# Patient Record
Sex: Female | Born: 1960 | Race: Black or African American | Hispanic: No | Smoking: Never smoker
Health system: Southern US, Community
[De-identification: ages and names within clinical notes are randomized; demographics above are authoritative.]

## PROBLEM LIST (undated history)

## (undated) DIAGNOSIS — M1990 Unspecified osteoarthritis, unspecified site: Secondary | ICD-10-CM

## (undated) DIAGNOSIS — I10 Essential (primary) hypertension: Secondary | ICD-10-CM

## (undated) DIAGNOSIS — E785 Hyperlipidemia, unspecified: Secondary | ICD-10-CM

## (undated) HISTORY — DX: Hyperlipidemia, unspecified: E78.5

## (undated) HISTORY — PX: NO PAST SURGERIES: SHX2092

## (undated) HISTORY — DX: Essential (primary) hypertension: I10

---

## 2004-05-05 ENCOUNTER — Emergency Department: Payer: Self-pay | Admitting: Emergency Medicine

## 2005-04-21 ENCOUNTER — Emergency Department: Payer: Self-pay | Admitting: Emergency Medicine

## 2006-01-22 ENCOUNTER — Ambulatory Visit: Payer: Self-pay

## 2007-05-05 ENCOUNTER — Ambulatory Visit: Payer: Self-pay | Admitting: General Practice

## 2010-08-12 ENCOUNTER — Ambulatory Visit: Payer: Self-pay

## 2011-03-12 ENCOUNTER — Other Ambulatory Visit: Payer: Self-pay | Admitting: Physician Assistant

## 2011-05-09 ENCOUNTER — Other Ambulatory Visit: Payer: Self-pay

## 2011-05-09 LAB — BASIC METABOLIC PANEL
BUN: 12 mg/dL (ref 7–18)
Chloride: 102 mmol/L (ref 98–107)
EGFR (African American): >60
EGFR (Non-African Amer.): >60
Glucose: 109 mg/dL — ABNORMAL HIGH (ref 65–99)
Potassium: 2.9 mmol/L — ABNORMAL LOW (ref 3.5–5.1)
Sodium: 143 mmol/L (ref 136–145)

## 2011-05-09 LAB — CBC WITH DIFFERENTIAL/PLATELET
Basophil %: 0.4 %
Eosinophil #: 0.2 10*3/uL (ref 0.0–0.7)
HCT: 40.2 % (ref 35.0–47.0)
HGB: 12.8 g/dL (ref 12.0–16.0)
Lymphocyte #: 2.3 10*3/uL (ref 1.0–3.6)
Lymphocyte %: 29.9 %
MCHC: 31.9 g/dL — ABNORMAL LOW (ref 32.0–36.0)
MCV: 80 fL (ref 80–100)
Neutrophil #: 4.5 10*3/uL (ref 1.4–6.5)
RBC: 5.04 10*6/uL (ref 3.80–5.20)

## 2011-07-23 ENCOUNTER — Ambulatory Visit: Payer: Self-pay | Admitting: Family Medicine

## 2011-07-23 ENCOUNTER — Other Ambulatory Visit: Payer: Self-pay

## 2011-07-23 LAB — BASIC METABOLIC PANEL
Anion Gap: 8 (ref 7–16)
BUN: 12 mg/dL (ref 7–18)
Chloride: 103 mmol/L (ref 98–107)
Co2: 30 mmol/L (ref 21–32)
EGFR (African American): >60
EGFR (Non-African Amer.): >60
Glucose: 93 mg/dL (ref 65–99)
Osmolality: 281 (ref 275–301)
Sodium: 141 mmol/L (ref 136–145)

## 2011-07-23 LAB — HEMOGLOBIN A1C: Hemoglobin A1C: 5.9 % (ref 4.2–6.3)

## 2011-10-22 ENCOUNTER — Other Ambulatory Visit: Payer: Self-pay

## 2011-10-22 LAB — BASIC METABOLIC PANEL
Anion Gap: 7 (ref 7–16)
BUN: 13 mg/dL (ref 7–18)
Calcium, Total: 9.1 mg/dL (ref 8.5–10.1)
Chloride: 101 mmol/L (ref 98–107)
Creatinine: 0.89 mg/dL (ref 0.60–1.30)
EGFR (Non-African Amer.): >60
Glucose: 86 mg/dL (ref 65–99)
Osmolality: 279 (ref 275–301)
Potassium: 3.2 mmol/L — ABNORMAL LOW (ref 3.5–5.1)
Sodium: 140 mmol/L (ref 136–145)

## 2012-08-05 ENCOUNTER — Ambulatory Visit: Payer: Self-pay | Admitting: Family Medicine

## 2012-09-16 ENCOUNTER — Other Ambulatory Visit: Payer: Self-pay

## 2012-09-16 LAB — LIPID PANEL: Ldl Cholesterol, Calc: 121 mg/dL — ABNORMAL HIGH (ref 0–100)

## 2012-09-16 LAB — COMPREHENSIVE METABOLIC PANEL
Alkaline Phosphatase: 98 U/L (ref 50–136)
Anion Gap: 4 — ABNORMAL LOW (ref 7–16)
Calcium, Total: 9 mg/dL (ref 8.5–10.1)
Chloride: 105 mmol/L (ref 98–107)
EGFR (African American): >60
EGFR (Non-African Amer.): >60
Glucose: 97 mg/dL (ref 65–99)
SGOT(AST): 14 U/L — ABNORMAL LOW (ref 15–37)
Total Protein: 7.5 g/dL (ref 6.4–8.2)

## 2013-09-04 ENCOUNTER — Emergency Department: Payer: Self-pay | Admitting: Emergency Medicine

## 2014-04-13 ENCOUNTER — Ambulatory Visit: Payer: Self-pay

## 2014-04-13 LAB — BASIC METABOLIC PANEL
Anion Gap: 6 — ABNORMAL LOW (ref 7–16)
BUN: 12 mg/dL (ref 7–18)
Calcium, Total: 8.7 mg/dL (ref 8.5–10.1)
Chloride: 106 mmol/L (ref 98–107)
Co2: 29 mmol/L (ref 21–32)
Creatinine: 0.84 mg/dL (ref 0.60–1.30)
EGFR (African American): >60
EGFR (Non-African Amer.): >60
Glucose: 91 mg/dL (ref 65–99)
Osmolality: 281 (ref 275–301)
Potassium: 3.5 mmol/L (ref 3.5–5.1)
Sodium: 141 mmol/L (ref 136–145)

## 2014-12-12 ENCOUNTER — Other Ambulatory Visit: Payer: Self-pay | Admitting: Family Medicine

## 2015-01-11 ENCOUNTER — Other Ambulatory Visit: Payer: Self-pay | Admitting: Family Medicine

## 2015-01-17 ENCOUNTER — Ambulatory Visit (INDEPENDENT_AMBULATORY_CARE_PROVIDER_SITE_OTHER): Payer: 59 | Admitting: Family Medicine

## 2015-01-17 ENCOUNTER — Other Ambulatory Visit: Payer: Self-pay | Admitting: Family Medicine

## 2015-01-17 ENCOUNTER — Encounter: Payer: Self-pay | Admitting: Family Medicine

## 2015-01-17 VITALS — BP 140/80 | HR 76 | Resp 16 | Ht 64.0 in | Wt 234.4 lb

## 2015-01-17 DIAGNOSIS — I10 Essential (primary) hypertension: Secondary | ICD-10-CM | POA: Insufficient documentation

## 2015-01-17 DIAGNOSIS — Z23 Encounter for immunization: Secondary | ICD-10-CM | POA: Diagnosis not present

## 2015-01-17 DIAGNOSIS — Z6841 Body Mass Index (BMI) 40.0 and over, adult: Secondary | ICD-10-CM

## 2015-01-17 MED ORDER — LISINOPRIL 40 MG PO TABS: 40.0000 mg | ORAL_TABLET | Freq: Every day | ORAL | Status: DC

## 2015-01-17 NOTE — Progress Notes (Signed)
Name: Kelli Chapman   MRN: [id number]    DOB: 11-May-1960   Date:01/17/2015       Progress Note  Subjective  Chief Complaint  Chief Complaint  Patient presents with  . Hypertension    HPI For f/u of HBP.  Taking Amlodipine and HCTZ (?dose of HCTZ).   No problems reported.   No problem-specific assessment & plan notes found for this encounter.   Past Medical History  Diagnosis Date  . Hypertension     Social History  Substance Use Topics  . Smoking status: Never Smoker   . Smokeless tobacco: Never Used  . Alcohol Use: No     Current outpatient prescriptions:  .  amLODipine (NORVASC) 5 MG tablet, TAKE 1 TABLET BY MOUTH ONCE DAILY, Disp: 7 tablet, Rfl: 0 .  hydrochlorothiazide (HYDRODIURIL) 25 MG tablet, Take 25 mg by mouth daily., Disp: , Rfl:   Not on File  Review of Systems  Constitutional: Negative for fever, chills, weight loss and malaise/fatigue.  HENT: Negative for hearing loss.   Eyes: Negative for blurred vision and double vision.  Respiratory: Negative for cough, sputum production, shortness of breath and wheezing.   Cardiovascular: Negative for chest pain, palpitations, orthopnea and leg swelling.  Gastrointestinal: Negative for heartburn, abdominal pain and blood in stool.  Genitourinary: Negative for dysuria, urgency and frequency.  Musculoskeletal: Negative for myalgias and joint pain.  Neurological: Negative for dizziness, tremors, weakness and headaches.      Objective  Filed Vitals:   01/17/15 1038  BP: 143/84  Pulse: 76  Resp: 16  Height: 5\' 4"  (1.626 m)  Weight: 234 lb 6.4 oz (106.323 kg)     Physical Exam  Constitutional: She is well-developed, well-nourished, and in no distress. No distress.  HENT:  Head: Normocephalic and atraumatic.  Eyes: Conjunctivae and EOM are normal. Pupils are equal, round, and reactive to light. No scleral icterus.  Neck: Normal range of motion. Neck supple. Carotid bruit is not present. No thyromegaly  present.  Cardiovascular: Normal rate, regular rhythm, normal heart sounds and intact distal pulses.  Exam reveals no gallop and no friction rub.   No murmur heard. Pulmonary/Chest: Effort normal and breath sounds normal. No respiratory distress. She has no wheezes. She has no rales.  Abdominal: Soft. Bowel sounds are normal. She exhibits no distension, no abdominal bruit and no mass. There is no tenderness.  Musculoskeletal: She exhibits no edema.  Lymphadenopathy:    She has no cervical adenopathy.  Vitals reviewed.     No results found for this or any previous visit (from the past 2160 hour(s)).   Assessment & Plan  1. Essential hypertension -cont. Amlodipine 5 mg/d. - lisinopril (PRINIVIL,ZESTRIL) 40 MG tablet; Take 1 tablet (40 mg total) by mouth daily.  Dispense: 90 tablet; Refill: 3  2. Need for influenza vaccination  - Flu Vaccine QUAD 36+ mos PF IM (Fluarix & Fluzone Quad PF)

## 2015-01-17 NOTE — Telephone Encounter (Signed)
Pt. Called need a refill on  Lisinopril  [facility]

## 2015-01-17 NOTE — Patient Instructions (Signed)
Plan CMP, CBC, and Lipid panel on return.

## 2015-01-17 NOTE — Telephone Encounter (Signed)
New rx was sent to pharmacy

## 2015-09-30 ENCOUNTER — Emergency Department
Admission: EM | Admit: 2015-09-30 | Discharge: 2015-09-30 | Disposition: A | Discharge status: Home / Self Care | Payer: 59 | Attending: Emergency Medicine | Admitting: Emergency Medicine

## 2015-09-30 ENCOUNTER — Encounter: Payer: Self-pay | Admitting: *Deleted

## 2015-09-30 DIAGNOSIS — M7551 Bursitis of right shoulder: Secondary | ICD-10-CM | POA: Insufficient documentation

## 2015-09-30 DIAGNOSIS — I10 Essential (primary) hypertension: Secondary | ICD-10-CM | POA: Insufficient documentation

## 2015-09-30 DIAGNOSIS — M719 Bursopathy, unspecified: Secondary | ICD-10-CM

## 2015-09-30 DIAGNOSIS — M25511 Pain in right shoulder: Secondary | ICD-10-CM | POA: Diagnosis present

## 2015-09-30 MED ORDER — IBUPROFEN 800 MG PO TABS: 800.0000 mg | ORAL_TABLET | Freq: Three times a day (TID) | ORAL | Status: DC | PRN

## 2015-09-30 MED ORDER — HYDROCODONE-ACETAMINOPHEN 5-325 MG PO TABS
1.0000 | ORAL_TABLET | Freq: Once | ORAL | Status: AC
  Administered 2015-09-30: 1 via ORAL
  Filled 2015-09-30: qty 1

## 2015-09-30 MED ORDER — HYDROCODONE-ACETAMINOPHEN 5-325 MG PO TABS: 1.0000 | ORAL_TABLET | Freq: Four times a day (QID) | ORAL | Status: DC | PRN

## 2015-09-30 MED ORDER — IBUPROFEN 800 MG PO TABS
800.0000 mg | ORAL_TABLET | Freq: Once | ORAL | Status: AC
  Administered 2015-09-30: 800 mg via ORAL
  Filled 2015-09-30: qty 1

## 2015-09-30 NOTE — ED Provider Notes (Signed)
[facility] Emergency Department Provider Note   ____________________________________________  Time seen: Approximately 4:01 AM  I have reviewed the triage vital signs and the nursing notes.   HISTORY  Chief Complaint Shoulder Pain    HPI Kelli Chapman is a 55 y.o. female who presents to the ED from home with a chief complaint of right shoulder pain. 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 She is right-hand dominant and has been noticing nagging right shoulder pain for the past 3 weeks. Pain worsening 1-1/2 days ago while at work. Denies associated fever, chills, chest pain, shortness of breath, abdominal pain, nausea, vomiting, diarrhea. Denies recent travel or trauma/injury. Nothing makes her pain better (she has not tried anything); movement makes her pain worse.   Past Medical History  Diagnosis Date  . Hypertension     Patient Active Problem List   Diagnosis Date Noted  . HBP (high blood pressure) 01/17/2015  . Obesity 01/17/2015    History reviewed. No pertinent past surgical history.  Current Outpatient Rx  Name  Route  Sig  Dispense  Refill  . amLODipine (NORVASC) 5 MG tablet      TAKE 1 TABLET BY MOUTH ONCE DAILY NEEDS OFFICE VISIT   90 tablet   3     PATIENT IS REQUESTING A 90 DAY SUPPLY. PATIENT PIC ...   . HYDROcodone-acetaminophen (NORCO) 5-325 MG tablet   Oral   Take 1 tablet by mouth every 6 (six) hours as needed for moderate pain.   15 tablet   0   . ibuprofen (ADVIL,MOTRIN) 800 MG tablet   Oral   Take 1 tablet (800 mg total) by mouth every 8 (eight) hours as needed for moderate pain.   15 tablet   0   . lisinopril (PRINIVIL,ZESTRIL) 40 MG tablet   Oral   Take 1 tablet (40 mg total) by mouth daily.   90 tablet   3     Allergies Review of patient's allergies indicates no known allergies.  Family History  Problem Relation Age of Onset  . Hypertension Mother   . Heart disease  Mother   . Kidney disease Paternal Grandfather     Social History Social History  Substance Use Topics  . Smoking status: Never Smoker   . Smokeless tobacco: Never Used  . Alcohol Use: No    Review of Systems  Constitutional: No fever/chills. Eyes: No visual changes. ENT: No sore throat. Cardiovascular: Denies chest pain. Respiratory: Denies shortness of breath. Gastrointestinal: No abdominal pain.  No nausea, no vomiting.  No diarrhea.  No constipation. Genitourinary: Negative for dysuria. Musculoskeletal: Positive for right shoulder pain. Negative for back pain. Skin: Negative for rash. Neurological: Negative for headaches, focal weakness or numbness.  10-point ROS otherwise negative.  ____________________________________________   PHYSICAL EXAM:  VITAL SIGNS: ED Triage Vitals  Enc Vitals Group     BP 09/30/15 0321 159/99 mmHg     Pulse Rate 09/30/15 0321 81     Resp 09/30/15 0321 20     Temp 09/30/15 0321 98.2 F (36.8 C)     Temp Source 09/30/15 0321 Oral     SpO2 09/30/15 0321 99 %     Weight 09/30/15 0321 230 lb 6.4 oz (104.509 kg)     Height 09/30/15 0321 5\' 4"  (1.626 m)     Head Cir --      Peak Flow --      Pain  Score 09/30/15 0322 5     Pain Loc --      Pain Edu? --      Excl. in GC? --     Constitutional: Alert and oriented. Well appearing and in no acute distress. Eyes: Conjunctivae are normal. PERRL. EOMI. Head: Atraumatic. Nose: No congestion/rhinnorhea. Mouth/Throat: Mucous membranes are moist.  Oropharynx non-erythematous. Neck: No stridor.  No cervical spine tenderness to palpation. Cardiovascular: Normal rate, regular rhythm. Grossly normal heart sounds.  Good peripheral circulation. Respiratory: Normal respiratory effort.  No retractions. Lungs CTAB. Gastrointestinal: Soft and nontender. No distention. No abdominal bruits. No CVA tenderness. Musculoskeletal: Right shoulder slightly swollen. Limited range of motion abduction secondary to  pain. 2+ radial pulses. Brisk, less than 5 second capillary refill. Neurologic:  Normal speech and language. No gross focal neurologic deficits are appreciated. No gait instability. Skin:  Skin is warm, dry and intact. No rash noted. Psychiatric: Mood and affect are normal. Speech and behavior are normal.  ____________________________________________   LABS (all labs ordered are listed, but only abnormal results are displayed)  Labs Reviewed - No data to display ____________________________________________  EKG  None ____________________________________________  RADIOLOGY  None ____________________________________________   PROCEDURES  Procedure(s) performed: None  Critical Care performed: No  ____________________________________________   INITIAL IMPRESSION / ASSESSMENT AND PLAN / ED COURSE  Pertinent labs & imaging results that were available during my care of the patient were reviewed by me and considered in my medical decision making (see chart for details).  55 year old female who presents with a three-week history of right shoulder pain. History and clinical exam consistent with bursitis. Will place on a regimen of NSAIDs, analgesia and close follow-up with orthopedics. Strict return precautions given. Patient verbalizes understanding and agrees with plan of care. ____________________________________________   FINAL CLINICAL IMPRESSION(S) / ED DIAGNOSES  Final diagnoses:  Bursitis      NEW MEDICATIONS STARTED DURING THIS VISIT:  New Prescriptions   HYDROCODONE-ACETAMINOPHEN (NORCO) 5-325 MG TABLET    Take 1 tablet by mouth every 6 (six) hours as needed for moderate pain.   IBUPROFEN (ADVIL,MOTRIN) 800 MG TABLET    Take 1 tablet (800 mg total) by mouth every 8 (eight) hours as needed for moderate pain.     Note:  This document was prepared using Dragon voice recognition software and may include unintentional dictation errors.    Irean HongJade J Sung,  MD 09/30/15 [id number]

## 2015-09-30 NOTE — Discharge Instructions (Signed)
1. Take pain medicines as needed (Motrin/Norco #15). 2. Apply ice to affected area several times daily. 3. Return to the ER for worsening symptoms, persistent vomiting, difficulty breathing or other concerns.

## 2015-09-30 NOTE — ED Notes (Addendum)
Pt c/o nagging R shoulder pain x 3 weeks. Pt states worsening pain starting on Friday. Pt states pain exacerbated by upward and outward movement and when lying flat. Pt states she hears and feels her R shoulder "crackling". R arm and hand warm w/ palpable pulses. 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 Pt has taken no meds for pain prior to arrival.

## 2015-10-24 ENCOUNTER — Encounter: Payer: Self-pay | Admitting: Physician Assistant

## 2015-10-24 ENCOUNTER — Ambulatory Visit: Payer: Self-pay | Admitting: Physician Assistant

## 2015-10-24 VITALS — BP 160/100 | HR 76 | Temp 98.8°F

## 2015-10-24 DIAGNOSIS — M7521 Bicipital tendinitis, right shoulder: Secondary | ICD-10-CM

## 2015-10-24 MED ORDER — METHYLPREDNISOLONE 4 MG PO TBPK: ORAL_TABLET | ORAL | 0 refills | Status: DC

## 2015-10-24 NOTE — Progress Notes (Signed)
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

## 2015-10-26 DIAGNOSIS — M7541 Impingement syndrome of right shoulder: Secondary | ICD-10-CM | POA: Diagnosis not present

## 2015-11-02 ENCOUNTER — Encounter: Payer: Self-pay | Admitting: Physical Therapy

## 2015-11-07 ENCOUNTER — Ambulatory Visit: Payer: 59 | Attending: Orthopedic Surgery

## 2015-11-07 DIAGNOSIS — R293 Abnormal posture: Secondary | ICD-10-CM | POA: Insufficient documentation

## 2015-11-07 DIAGNOSIS — M25511 Pain in right shoulder: Secondary | ICD-10-CM | POA: Insufficient documentation

## 2015-11-07 NOTE — Therapy (Signed)
[facility] MAIN [facility] SERVICES [address] [place], [place], [address] Phone: [phone]   Fax:  [phone]  Physical Therapy Evaluation  Patient Details  Name: Kelli Chapman MRN: [id number] Date of Birth: 1961/03/02 No Data Recorded  Encounter Date: 11/07/2015      PT End of Session - 11/07/15 0904    Visit Number 1   Number of Visits 13   Date for PT Re-Evaluation 12/05/15   PT Start Time 0805   PT Stop Time 0858   PT Time Calculation (min) 53 min   Activity Tolerance Patient tolerated treatment well   Behavior During Therapy [facility] for tasks assessed/performed      Past Medical History:  Diagnosis Date  . Hypertension     History reviewed. No pertinent surgical history.  There were no vitals filed for this visit.       Subjective Assessment - 11/07/15 0938    Subjective Pt reports having R shoulder pain over the last month and  with no known onset. 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 She reports hearing and feeling clicking in her shoulder about 1 month ago. She states that it will sometimes radiate up towards her neck or down her arm. She denies any neck pain otherwise, denies n/t. She states that the R arm has began to feel weaker when lifting. She reports her pain being on the lateral aspect of her R shoulder. Lifting her arm and her work aggravates it. She states the ibuprofen helps it when she takes it. She has tried ice pack but it makes it throb/hurt worse. She reports it is difficult to get comfortable but she falls asleep easily, however, it has woken her up during the night occasionally. She has difficulty with household chores but is able to complete them through the pain. She reports previously working 130-150 hours a week prior to her shoulder pain and has cut down slightly due to the pain.   Pertinent History work duties   Limitations  Lifting;House hold activities   Patient Stated Goals to get arm working better so she can continue to work overtime hours   Currently in Pain? Yes   Pain Score 6    Pain Location Shoulder   Pain Orientation Right   Pain Descriptors / Indicators Throbbing   Pain Type Acute pain   Pain Onset 1 to 4 weeks ago   Pain Frequency Constant   Aggravating Factors  lifting   Pain Relieving Factors rest   Effect of Pain on Daily Activities difficulty performing work and house duties      DERMATOMES WNL bil  AROM R shoulder abduction: 75 deg, limited due to pain R shoulder flexion WFL, pain at end range  Cervical AROM WNL and non-painful  STRENGTH (on scale of 0-5/5)  Left Right  Shoulder flexion 5 4- painful  Shoulder extension    Shoulder abduction 5 3+ painful  Shoulder IR/ER 5 IR 5 / ER 4 painful  Elbow flexion 5 5  Elbow extension 5 5  Wrist flexion 5 5  Wrist extension 5 5  Supination 5 5  Pronation  5 5  Serratus 5 4  Lower trap  3+  Middle trap  3+  Rhomboid  3    PALPATION Thoracic spine hypomobile per CPAs, non-painful R Scapular PROM WNL R posterior and inferior shoulder joint mobility WNL, non-painful  R Shoulder musculature (posterior and anterior) increased soft tissue restrictions, non-painful to palpation R Biceps long head tendon inflamed and tender to palpation   CERVICAL SPINE WNL and no increase in shoulder symptoms  POSTURE Increased thoracic kyphosis, forward rounded shoulders (sitting > standing), forward head posture  SPECIAL TESTS Hawkins-kennedy: +R Neer's: + R, flex and abduction Obrien's test: +R Biceps Load I and II: -R Crank test: -R        PT Education - 11/07/15 0904    Education provided Yes   Education Details exam findings, HEP   Person(s) Educated Patient   Methods Explanation   Comprehension Verbalized understanding             PT Long Term Goals - 11/07/15 0926      PT LONG TERM GOAL #1   Title pt will have  decreased QuickDash score by >8 points to demonstrate decreased pain and improved overall function with work and household duties.   Baseline 59   Time 6   Period Weeks   Status New     PT LONG TERM GOAL #2   Title pt will have improved R shoulder abd to 170 deg with <2/10 pain to demonstrate improved overall function and improve ability to perform work duties.   Baseline 75 deg, limited due to pain   Time 6   Period Weeks   Status New     PT LONG TERM GOAL #3   Title pt will be independent with HEP to promote recovery and prevent reinjury.   Baseline i   Time 6   Period Weeks   Status New               Plan - 11/07/15 0904    Clinical Impression Statement Pt is 55 YO F who presents to therapy with s/s consistent with R shoulder impingement syndrome secondary to weakness and postural dysfunction. Pt has deficits in strength of R shoulder and scapular musculature, soft tissue restrictions of biceps and deltoid, pain with elevation (abd > flexion), and hypomobile thoracic spine. Pt had pain with impingement special tests and had a significant decrease in symptoms with the Neer's special test, indicating poor scapular stabilization with OH movements. 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 Pt needs skilled PT intervention to decrease pain, and maximize overall strength and function.   Rehab Potential Good   Clinical Impairments Affecting Rehab Potential amount of work per week (130-150 hours/week), however, pt is motivated and needs to improve to contiue working.   PT Frequency 2x / week   PT Duration 6 weeks   PT Treatment/Interventions ADLs/Self Care Home Management;Cryotherapy;Electrical Stimulation;Functional mobility training;Therapeutic activities;Therapeutic exercise;Neuromuscular re-education;Patient/family education;Manual techniques;Passive range of motion;Dry needling   PT Next Visit Plan address posture, manual techniques,  strengthening   Consulted and Agree with Plan of Care Patient      Patient will benefit from skilled therapeutic intervention in order to improve the following deficits and impairments:  Decreased activity tolerance, Decreased range of motion, Decreased strength, Hypomobility, Increased muscle spasms, Impaired UE functional use, Improper body mechanics, Postural dysfunction, Pain  Visit Diagnosis: Pain in right shoulder - Plan: PT plan of care cert/re-cert  Abnormal posture - Plan: PT plan of care cert/re-cert     Problem List Patient Active Problem List   Diagnosis Date Noted  . HBP (high blood pressure) 01/17/2015  . Obesity 01/17/2015   Jac Canavan, SPT This entire session  was performed under direct supervision and direction of a licensed Estate agenttherapist/therapist assistant . I have personally read, edited and approve of the note as written. Carlyon ShadowAshley C. Tortorici, PT, DPT [phone]  Tortorici,Ashley 11/07/2015, 3:57 PM  [facility] MAIN [facility] [address] [place], [name], [address] Phone: [phone]   Fax:  [phone]  Name: Shane CrutchMary C Chapman MRN: [id number] Date of Birth: 1960-07-21

## 2015-11-07 NOTE — Patient Instructions (Signed)
[email or web address] bil low row with RTB, 2x10 bil scap retraction with RTB, 2x10 R biceps stretch in doorway, 3x30 sec Towel slides on wall into abduction, 2x10 1x/day

## 2015-11-09 ENCOUNTER — Ambulatory Visit: Payer: 59

## 2015-11-09 ENCOUNTER — Encounter: Payer: Self-pay | Admitting: Physical Therapy

## 2015-11-09 DIAGNOSIS — R293 Abnormal posture: Secondary | ICD-10-CM | POA: Diagnosis not present

## 2015-11-09 DIAGNOSIS — M25511 Pain in right shoulder: Secondary | ICD-10-CM | POA: Diagnosis not present

## 2015-11-09 NOTE — Therapy (Signed)
[name], [name], [address] Phone: [phone]   Fax:  [phone]  Physical Therapy Treatment  Patient Details  Name: Kelli Chapman MRN: [id number] Date of Birth: 1960-10-07 No Data Recorded  Encounter Date: 11/09/2015      PT End of Session - 11/09/15 0954    Visit Number 2   Number of Visits 13   Date for PT Re-Evaluation 12/05/15   PT Start Time 0803   PT Stop Time 0843   PT Time Calculation (min) 40 min   Activity Tolerance Patient limited by pain;Patient tolerated treatment well   Behavior [facility] Therapy [facility] for tasks assessed/performed      Past Medical History:  Diagnosis Date  . Hypertension     History reviewed. No pertinent surgical history.  There were no vitals filed for this visit.      Subjective Assessment - 11/09/15 0806    Subjective Patient reports having an increase in pain to 10/10 the day after therapy and when performing HEP. She states she ices at home and her pain has improved to a 3/10.    Pertinent History work duties   Limitations Lifting;House hold activities   Patient Stated Goals to get arm working better so she can continue to work overtime hours   Currently in Pain? Yes   Pain Score 3    Pain Location Shoulder   Pain Orientation Right   Pain Descriptors / Indicators Aching   Pain Onset 1 to 4 weeks ago      Treatment:  Manual Therapy: Patient in prone, SPT performed Grade II PA mobs to T5-T11, with Grade I PAs to C7-T4 due to increased discomfort, 4x15 seconds each segment, ~12 minutes.  Patient responded well with a mild decrease in hypomobility.   Patient in supine with rolled towel under RUE, SPT performed Grade I posterior (3x30 seconds)/inferior mobs (2x15 seconds), for ~ 10 minutes, initiated for pain relief, pain decreased to 0/10.    TherEx: Instructed on seated use of lumbar roll and importance of good posture, min VCs for towel  placement, maintains shoulders in slight retracted position, maintained that position for x2 minutes  Seated thoracic extension over towel roll, 2x10, patient reports relief, arms at side, min VCs to increase thoracic movement and not BUE.    Prone scapular strengthening (no weight): Attempted low row and mid row, patient had increase in pain with 1-2 reps so discontinued. Shoulder extension, 3x8, 1-2 min rest in between sets, min VCs to perform greater shoulder ROM, patient reports no discomfort when performing; however once she moved to seated she reported her pain had increased to 5/10.   Shoulder manual therapy was initiated to relieve pain as stated above.    At end of session, patient continued laying supine with RUE supported by towel and iced for ~3 minutes, patient states her pain continues to maintain at 0/10.                            PT Education - 11/09/15 [phone]    Education provided Yes   Education Details modality use, taking bigger breaks during HEP, not performing as many sets at one time, and stopping if pain continues    Person(s) Educated Patient   Methods Explanation   Comprehension Verbalized understanding             PT Long Term Goals - 11/07/15 [id number]  PT LONG TERM GOAL #1   Title pt will have decreased QuickDash score by >8 points to demonstrate decreased pain and improved overall function with work and household duties.   Baseline 59   Time 6   Period Weeks   Status New     PT LONG TERM GOAL #2   Title pt will have improved R shoulder abd to 170 deg with <2/10 pain to demonstrate improved overall function and improve ability to perform work duties.   Baseline 75 deg, limited due to pain   Time 6   Period Weeks   Status New     PT LONG TERM GOAL #3   Title pt will be independent with HEP to promote recovery and prevent reinjury.   Baseline i   Time 6   Period Weeks   Status New               Plan - 11/09/15  0954    Clinical Impression Statement Patient was able to participate in therapy well, but she does have increases in pain to 5/10 during prone scapular strengthening. Her pain does decrease to 0/10 when stopping activity and beginning Grade I-II joint mobs on RUE. Patient had no change in pain with thoracic mobilizaitons, posture training, or thoracic extension over a chair. Patient would continue to benefit from skilled PT in order to address R shoulder pain, improve spinal mobility, address scapular strengthening, and improve abillity to participate in work.    Rehab Potential Good   Clinical Impairments Affecting Rehab Potential amount of work per week (130-150 hours/week), however, pt is motivated and needs to improve to contiue working.   PT Frequency 2x / week   PT Duration 6 weeks   PT Treatment/Interventions ADLs/Self Care Home Management;Cryotherapy;Electrical Stimulation;Functional mobility training;Therapeutic activities;Therapeutic exercise;Neuromuscular re-education;Patient/family education;Manual techniques;Passive range of motion;Dry needling   PT Next Visit Plan continue addressing posture, manual techniques including grade I-II shoulder mobs, strengthening   Consulted and Agree with Plan of Care Patient      Patient will benefit from skilled therapeutic intervention in order to improve the following deficits and impairments:  Decreased activity tolerance, Decreased range of motion, Decreased strength, Hypomobility, Increased muscle spasms, Impaired UE functional use, Improper body mechanics, Postural dysfunction, Pain  Visit Diagnosis: Pain in right shoulder  Abnormal posture     Problem List Patient Active Problem List   Diagnosis Date Noted  . HBP (high blood pressure) 01/17/2015  . Obesity 01/17/2015   This entire session was performed under direct supervision and direction of a licensed therapist/therapist assistant . I have personally read, edited and approve of the  note as written.   Lynnea Maizes PT, DPT   Trula Ore, SPT Huprich,Jason 11/09/2015, 11:18 AM  [facility] MAIN [facility] SERVICES [address] [place], [place], [address] Phone: [phone]   Fax:  [phone]  Name: Kelli Chapman MRN: [id number] Date of Birth: [birth year]

## 2015-11-12 ENCOUNTER — Encounter: Payer: Self-pay | Admitting: Physical Therapy

## 2015-11-14 ENCOUNTER — Ambulatory Visit: Payer: 59

## 2015-11-14 DIAGNOSIS — M25511 Pain in right shoulder: Secondary | ICD-10-CM | POA: Diagnosis not present

## 2015-11-14 DIAGNOSIS — R293 Abnormal posture: Secondary | ICD-10-CM

## 2015-11-14 NOTE — Therapy (Signed)
[place] [facility] [address] [place], [name], [address] Phone: [phone]   Fax:  [phone]  Physical Therapy Treatment  Patient Details  Name: Kelli CrutchMary C Chapman MRN: [id number] Date of Birth: 1960/11/22 No Data Recorded  Encounter Date: 11/14/2015      PT End of Session - 11/14/15 0859    Visit Number 3   Number of Visits 13   Date for PT Re-Evaluation 12/05/15   PT Start Time 0803   PT Stop Time 0845   PT Time Calculation (min) 42 min   Activity Tolerance Patient limited by pain;Patient tolerated treatment well   Behavior During Therapy [facility] for tasks assessed/performed      Past Medical History:  Diagnosis Date  . Hypertension     History reviewed. No pertinent surgical history.  There were no vitals filed for this visit.      Subjective Assessment - 11/14/15 0858    Subjective Pt reports she does not have any shoulder pain this date and states she is compliant with her HEP.   Pertinent History work duties   Limitations Lifting;House hold activities   Patient Stated Goals to get arm working better so she can continue to work overtime hours   Currently in Pain? No/denies   Pain Onset 1 to 4 weeks ago      Manual: Grade 3 inferior/posterior GH joint mobs, 2x30 sec each; pt slightly hypomobile both directions, non-painful throughout. Trigger point dry needling Muscle stripping, efflurage and cross friction to biceps muscle belly following trigger point dry needling; pt reported decreased pain to palpation following   Therex: Y's with lift off, 1x10 no resistance, 2x10 with YTB; min-mod verbal/tactile cues for proper technique Bent over I's, T's, Y's with 2.5# cuff weight, 3x10 each; min verbal cues for proper technique, min cues to decrease UT compensation Prone Y's and T's without weight, 3x10 each; min-mod verbal/tactile cues for decreased UT compensation and proper technique; pt demonstrated fatigue following  exercise Reviewed proper sitting posture with pt and educated pt on biceps stretch in doorway to decrease rounded shoulders        PT Education - 11/14/15 0859    Education provided Yes   Education Details scapular strengthening, exercise technique, manual techniques   Person(s) Educated Patient   Methods Explanation   Comprehension Verbalized understanding             PT Long Term Goals - 11/07/15 0926      PT LONG TERM GOAL #1   Title pt will have decreased QuickDash score by >8 points to demonstrate decreased pain and improved overall function with work and household duties.   Baseline 59   Time 6   Period Weeks   Status New     PT LONG TERM GOAL #2   Title pt will have improved R shoulder abd to 170 deg with <2/10 pain to demonstrate improved overall function and improve ability to perform work duties.   Baseline 75 deg, limited due to pain   Time 6   Period Weeks   Status New     PT LONG TERM GOAL #3   Title pt will be independent with HEP to promote recovery and prevent reinjury.   Baseline i   Time 6   Period Weeks   Status New               Plan - 11/14/15 0902    Clinical Impression Statement Pt presented to therapy  today with decreased pain and increased AROM. She also reports decreased R shoulder "clicking" since last treatment session. Pt continues with soft tissue restrictions of biceps long head and had trigger point in muscle belly that was tender to palpation. This was treated with trigger point dry needling which the pt tolerated well. Pt educated on importance of improving scapular strength to decrease load on biceps long head tendon. Pt needs continued skilled PT intervetion to maximize overall function and decrease pain.   Rehab Potential Good   Clinical Impairments Affecting Rehab Potential amount of work per week (130-150 hours/week), however, pt is motivated and needs to improve to contiue working.   PT Frequency 2x / week   PT Duration  6 weeks   PT Treatment/Interventions ADLs/Self Care Home Management;Cryotherapy;Electrical Stimulation;Functional mobility training;Therapeutic activities;Therapeutic exercise;Neuromuscular re-education;Patient/family education;Manual techniques;Passive range of motion;Dry needling   PT Next Visit Plan continue addressing posture, manual techniques including grade I-II shoulder mobs, strengthening   Consulted and Agree with Plan of Care Patient      Patient will benefit from skilled therapeutic intervention in order to improve the following deficits and impairments:  Decreased activity tolerance, Decreased range of motion, Decreased strength, Hypomobility, Increased muscle spasms, Impaired UE functional use, Improper body mechanics, Postural dysfunction, Pain  Visit Diagnosis: Pain in right shoulder  Abnormal posture     Problem List Patient Active Problem List   Diagnosis Date Noted  . HBP (high blood pressure) 01/17/2015  . Obesity 01/17/2015   Jac CanavanBrooke Kamori Barbier, SPT This entire session was performed under direct supervision and direction of a licensed therapist/therapist assistant . I have personally read, edited and approve of the note as written. Carlyon ShadowAshley C. Tortorici, PT, DPT [phone]  Tortorici,Ashley 11/14/2015, 4:09 PM  [place] [facility] [address] [place], [name], [address] Phone: [phone]   Fax:  [phone]  Name: Kelli CrutchMary C Chapman MRN: [id number] Date of Birth: March 13, 1961

## 2015-11-16 ENCOUNTER — Ambulatory Visit: Payer: 59 | Admitting: Physical Therapy

## 2015-11-21 ENCOUNTER — Ambulatory Visit: Payer: 59 | Admitting: Physical Therapy

## 2015-11-21 ENCOUNTER — Encounter: Payer: Self-pay | Admitting: Physical Therapy

## 2015-11-21 ENCOUNTER — Encounter: Payer: 59 | Admitting: Physical Therapy

## 2015-11-21 DIAGNOSIS — M25511 Pain in right shoulder: Secondary | ICD-10-CM

## 2015-11-21 DIAGNOSIS — R293 Abnormal posture: Secondary | ICD-10-CM | POA: Diagnosis not present

## 2015-11-21 NOTE — Therapy (Signed)
[place] [facility] [address] [place], [name], [address] Phone: [phone]   Fax:  [phone]  Physical Therapy Treatment  Patient Details  Name: Kelli CrutchMary C Chapman MRN: [id number] Date of Birth: 10-Jul-1960 No Data Recorded  Encounter Date: 11/21/2015      PT End of Session - 11/21/15 0850    Visit Number 4   Number of Visits 13   Date for PT Re-Evaluation 12/05/15   PT Start Time 0800   PT Stop Time 0845   PT Time Calculation (min) 45 min   Activity Tolerance Patient limited by pain;Patient tolerated treatment well   Behavior During Therapy [facility] for tasks assessed/performed      Past Medical History:  Diagnosis Date  . Hypertension     History reviewed. No pertinent surgical history.  There were no vitals filed for this visit.      Subjective Assessment - 11/21/15 0841    Subjective Pt reports that her RUE had increased pain yesterday and described it as "sharp pain." She reports that she feels better today but the pain affected her sleep last night.   Pertinent History work duties   Limitations Lifting;House hold activities   Patient Stated Goals to get arm working better so she can continue to work overtime hours   Currently in Pain? Yes   Pain Score 1    Pain Location Shoulder   Pain Orientation Right   Pain Descriptors / Indicators Dull   Pain Onset 1 to 4 weeks ago      Manual: Soft tissue mobilization R distal delt and long head biceps x 20 min; pt with increased tenderness to palpation which decreased slightly following treatment  R shoulder distraction mobs, grade 3, 3x20-30 sec bouts each  Manual biceps stretch with forearm pronated, pt in supine, 3x45-60 sec bouts; pt reported slightly decreased pain following   Therex: Sidelying ER with 2# DB and towel roll under RUE, 3x10; min verbal cues for proper technique, pt verbalized and demonstrated fatigue following exercise  Prone Y's and T's,  2x10 each; min verbal/tactile cues for decreased R shoulder shrug and proper scap stabilization throughout; pt demonstrated fatigue following exercise       PT Education - 11/21/15 0847    Education provided Yes   Education Details focus on stretching during HEP, rest RUE over the weekend   Person(s) Educated Patient   Methods Explanation   Comprehension Verbalized understanding             PT Long Term Goals - 11/07/15 0926      PT LONG TERM GOAL #1   Title pt will have decreased QuickDash score by >8 points to demonstrate decreased pain and improved overall function with work and household duties.   Baseline 59   Time 6   Period Weeks   Status New     PT LONG TERM GOAL #2   Title pt will have improved R shoulder abd to 170 deg with <2/10 pain to demonstrate improved overall function and improve ability to perform work duties.   Baseline 75 deg, limited due to pain   Time 6   Period Weeks   Status New     PT LONG TERM GOAL #3   Title pt will be independent with HEP to promote recovery and prevent reinjury.   Baseline i   Time 6   Period Weeks   Status New  Plan - 11/21/15 0851    Clinical Impression Statement Pt reported that she had inreased pain over the weekend in her RUE after working. She states that following last treatment session, she felt better and was able to do more with her RUE, but the sharp pain started yesterday. Pt had increased soft tissue restrictions of distal deltoid and biceps which was addressed with soft tissue mobilization. Pt educated to perform increased stretching during HEP to help decrease soft tissue restrictions and for pain management. Pt needs continued skilled PT intervention to maximize overall function.   Rehab Potential Good   Clinical Impairments Affecting Rehab Potential amount of work per week (130-150 hours/week), however, pt is motivated and needs to improve to contiue working.   PT Frequency 2x / week    PT Duration 6 weeks   PT Treatment/Interventions ADLs/Self Care Home Management;Cryotherapy;Electrical Stimulation;Functional mobility training;Therapeutic activities;Therapeutic exercise;Neuromuscular re-education;Patient/family education;Manual techniques;Passive range of motion;Dry needling   PT Next Visit Plan continue addressing posture, manual techniques including grade I-II shoulder mobs, strengthening   Consulted and Agree with Plan of Care Patient      Patient will benefit from skilled therapeutic intervention in order to improve the following deficits and impairments:  Decreased activity tolerance, Decreased range of motion, Decreased strength, Hypomobility, Increased muscle spasms, Impaired UE functional use, Improper body mechanics, Postural dysfunction, Pain  Visit Diagnosis: Pain in right shoulder  Abnormal posture     Problem List Patient Active Problem List   Diagnosis Date Noted  . HBP (high blood pressure) 01/17/2015  . Obesity 01/17/2015   Jac CanavanBrooke Powell, SPT  Jac CanavanBrooke Powell 11/21/2015, 9:12 AM  [facility] [address] [place], [name], [address] Phone: [phone]   Fax:  [phone]  Name: Kelli CrutchMary C Chapman MRN: [id number] Date of Birth: 1960-06-05

## 2015-11-23 ENCOUNTER — Encounter: Payer: Self-pay | Admitting: Physical Therapy

## 2015-11-23 ENCOUNTER — Ambulatory Visit: Payer: 59 | Admitting: Physical Therapy

## 2015-11-23 DIAGNOSIS — R293 Abnormal posture: Secondary | ICD-10-CM | POA: Diagnosis not present

## 2015-11-23 DIAGNOSIS — M25511 Pain in right shoulder: Secondary | ICD-10-CM | POA: Diagnosis not present

## 2015-11-23 NOTE — Therapy (Signed)
[facility] [address] [place], [name], [address] Phone: [phone]   Fax:  [phone]  Physical Therapy Treatment  Patient Details  Name: Kelli CrutchMary C Situ MRN: [id number] Date of Birth: 30-Aug-1960 No Data Recorded  Encounter Date: 11/23/2015      PT End of Session - 11/23/15 0845    Visit Number 5   Number of Visits 13   Date for PT Re-Evaluation 12/05/15   PT Start Time 0800   PT Stop Time 0845   PT Time Calculation (min) 45 min   Activity Tolerance Patient tolerated treatment well;No increased pain   Behavior During Therapy WFL for tasks assessed/performed      Past Medical History:  Diagnosis Date  . Hypertension     History reviewed. No pertinent surgical history.  There were no vitals filed for this visit.      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  Pt reports she has not had alot of time to do her stretches or exercises.     Pertinent History work duties   Limitations Lifting;House hold activities   Patient Stated Goals to get arm working better so she can continue to work overtime hours   Currently in Pain? Yes   Pain Score 3    Pain Location Shoulder   Pain Orientation Right   Pain Descriptors / Indicators Dull      Treatment Soft tissue mobilization to distal R deltoid for pain/soreness relief, pt presents with multiple trigger points at distal deltoid, tender to palpation but reports decreased soreness after (15 mins manual)  Reviewed biceps doorway stretch, 2 sets x 20 seconds, encouraged to repeat exercises throughout the day at work  Sidelying ER with 1lb weight, towel roll support, 2 sets x 10 reps, min VCS to keep elbow down  Standing rows in plantigrade, 2lb weight, 2 sets x 10 reps, min VCs to increase scapular retraction and eccentric  control  Standing straight arm extensions in plantigrade, 2lb weight, 2 sets x 10 reps, min VCs to maintain extension and scapular retraction  IR ball squeezes seated , 3 sets x 10 reps, min Vcs for posture and initial positioning   Seated RUE ER with yellow theraband resistance, 2 sets x 10 reps, min tactile cues to maintain elbow by side  AAROM against wall with therapy ball, 1 set x 15 reps, min VCs for upright posture and eccentric control, pt notes "clicking" around 90 degrees shoulder flexion but no pain                           PT Education - 11/23/15 0845    Education provided Yes   Education Details encouraged to stretch during work hours, icing, rest when possible    Person(s) Educated Patient   Methods Explanation;Demonstration;Verbal cues   Comprehension Verbalized understanding;Verbal cues required;Returned demonstration             PT Long Term Goals - 11/23/15 0849      PT LONG TERM GOAL #1   Title pt will have decreased QuickDash score by >8 points to demonstrate decreased pain and improved overall function with work and household duties.   Baseline 59   Time 6   Period Weeks   Status New  PT LONG TERM GOAL #2   Title pt will have improved R shoulder abd to 170 deg with <2/10 pain to demonstrate improved overall function and improve ability to perform work duties.   Baseline 75 deg, limited due to pain, 80 degrees pain limiteed (11/23/15)   Time 6   Period Weeks   Status On-going     PT LONG TERM GOAL #3   Title pt will be independent with HEP to promote recovery and prevent reinjury.   Baseline i   Time 6   Period Weeks   Status New               Plan - 11/23/15 0846    Clinical Impression Statement Pt reports having increased pain yesterday after working 12+ hours.  She believes therapy is helping with her pain but is then aggravated by the amount of time she works.  Pt responsed well to soft tissue mobilization to R  distal deltoid, pt was sensistive but reported some decrease in sorness after.  Pt continues to report increased pain with sidelying ER with 1lb weight but able to tolerate and does not increase pain after exercise.  Seated active abduction was measured at 80 degrees and continues to be most painful positioned.  Initiated more ER exercises to increase rotator cuff strengthening.  Pt reports some decrease in soreness at end of session.   Reeducated on the importance of HEP, icing, and stretching especially during the workday.  She would continue to benefit from further skilled PT to address soft tissue restriction, pain and strength to perform all job duties.     Rehab Potential Good   Clinical Impairments Affecting Rehab Potential amount of work per week (130-150 hours/week), however, pt is motivated and needs to improve to contiue working.   PT Frequency 2x / week   PT Duration 6 weeks   PT Treatment/Interventions ADLs/Self Care Home Management;Cryotherapy;Electrical Stimulation;Functional mobility training;Therapeutic activities;Therapeutic exercise;Neuromuscular re-education;Patient/family education;Manual techniques;Passive range of motion;Dry needling   PT Next Visit Plan continue addressing posture, manual techniques including grade I-II shoulder mobs, strengthening   Consulted and Agree with Plan of Care Patient      Patient will benefit from skilled therapeutic intervention in order to improve the following deficits and impairments:  Decreased activity tolerance, Decreased range of motion, Decreased strength, Hypomobility, Increased muscle spasms, Impaired UE functional use, Improper body mechanics, Postural dysfunction, Pain  Visit Diagnosis: Pain in right shoulder  Abnormal posture     Problem List Patient Active Problem List   Diagnosis Date Noted  . HBP (high blood pressure) 01/17/2015  . Obesity 01/17/2015   Waldon Reining, SPT  This entire session was performed under direct  supervision and direction of a licensed therapist/therapist assistant . I have personally read, edited and approve of the note as written.  Trotter,Margaret PT, DPT 11/23/2015, 9:20 AM   [place] [facility] MAIN [facility] SERVICES [address] [place], [place], [address] Phone: [phone]   Fax:  [phone]  Name: ANIAH PAULI MRN: [id number] Date of Birth: 1960/09/30

## 2015-11-28 ENCOUNTER — Ambulatory Visit: Payer: 59 | Admitting: Physical Therapy

## 2015-11-28 ENCOUNTER — Encounter: Payer: Self-pay | Admitting: Physical Therapy

## 2015-11-28 DIAGNOSIS — M25511 Pain in right shoulder: Secondary | ICD-10-CM | POA: Diagnosis not present

## 2015-11-28 DIAGNOSIS — R293 Abnormal posture: Secondary | ICD-10-CM

## 2015-11-28 NOTE — Therapy (Signed)
[place] [facility] [address] [place], [name], [address] Phone: [phone]   Fax:  [phone]  Physical Therapy Treatment  Patient Details  Name: Shane CrutchMary C Register MRN: [id number] Date of Birth: 01/28/1961 No Data Recorded  Encounter Date: 11/28/2015      PT End of Session - 11/28/15 0809    Visit Number 6   Number of Visits 13   Date for PT Re-Evaluation 12/05/15   PT Start Time 0810   PT Stop Time 0900   PT Time Calculation (min) 50 min   Activity Tolerance Patient tolerated treatment well;No increased pain   Behavior During Therapy WFL for tasks assessed/performed      Past Medical History:  Diagnosis Date  . Hypertension     History reviewed. No pertinent surgical history.  There were no vitals filed for this visit.      Subjective Assessment - 11/28/15 0912    Subjective Pt states she felt good yesterday at work but she had taken NSAIDs prior to her shift but reported that her pain increased again last night. She reports continued pain with abduction.   Pertinent History work duties   Limitations Lifting;House hold activities   Patient Stated Goals to get arm working better so she can continue to work overtime hours   Currently in Pain? Yes   Pain Score 2    Pain Location Shoulder   Pain Orientation Right   Pain Descriptors / Indicators Aching     Manual: Soft tissue mobilization to R supra spinatus x 30 mins; increased soft tissue restrictions throughout and recreated pt's pain  Thoracic mobilizations T2-T7, 2x30-45 sec bouts each; increased hypomobility at T4; slight tenderness at T4, no change in symptoms  R shoulder posterior capsule stretch (into horiz add), 3x45-60 secs with soft tissue mobilization to supraspinatus; pt had increased abd with less pain following stretching as she was able to reach 112 deg abd before onset of pain      PT Education - 11/28/15 0915    Education provided Yes   Education Details activity modifications, R posterior capsule stretching   Person(s) Educated Patient   Methods Explanation   Comprehension Verbalized understanding             PT Long Term Goals - 11/23/15 0849      PT LONG TERM GOAL #1   Title pt will have decreased QuickDash score by >8 points to demonstrate decreased pain and improved overall function with work and household duties.   Baseline 59   Time 6   Period Weeks   Status New     PT LONG TERM GOAL #2   Title pt will have improved R shoulder abd to 170 deg with <2/10 pain to demonstrate improved overall function and improve ability to perform work duties.   Baseline 75 deg, limited due to pain, 80 degrees pain limiteed (11/23/15)   Time 6   Period Weeks   Status On-going     PT LONG TERM GOAL #3   Title pt will be independent with HEP to promote recovery and prevent reinjury.   Baseline i   Time 6   Period Weeks   Status New               Plan - 11/28/15 0917    Clinical Impression Statement Pt presents to therapy today with continued c/o R shoulder pain with abduction. SPT assessed suprispinatus which had increased soft tissue restrictions and  recreated pt's pain. SPT educated pt on stretching this at home and also on modifying work and activity modifications to help promote improvement. Pt said she would see her orthopedist to get a lifting restriction at work to help decrease overuse of RUE and help decrease pain.    Rehab Potential Good   Clinical Impairments Affecting Rehab Potential amount of work per week (130-150 hours/week), however, pt is motivated and needs to improve to contiue working.   PT Frequency 2x / week   PT Duration 6 weeks   PT Treatment/Interventions ADLs/Self Care Home Management;Cryotherapy;Electrical Stimulation;Functional mobility training;Therapeutic activities;Therapeutic exercise;Neuromuscular re-education;Patient/family education;Manual techniques;Passive range of motion;Dry  needling   PT Next Visit Plan continue addressing posture, manual techniques including grade I-II shoulder mobs, strengthening   Consulted and Agree with Plan of Care Patient      Patient will benefit from skilled therapeutic intervention in order to improve the following deficits and impairments:  Decreased activity tolerance, Decreased range of motion, Decreased strength, Hypomobility, Increased muscle spasms, Impaired UE functional use, Improper body mechanics, Postural dysfunction, Pain  Visit Diagnosis: Pain in right shoulder  Abnormal posture     Problem List Patient Active Problem List   Diagnosis Date Noted  . HBP (high blood pressure) 01/17/2015  . Obesity 01/17/2015   Jac Canavan, SPT  Jac Canavan 11/28/2015, 9:54 AM  [place] [facility] PHYSICAL AND SPORTS MEDICINE [address] [place], [address] Phone: [phone]   Fax:  [phone]  Name: NIMCO BIVENS MRN: [id number] Date of Birth: March 27, 1961

## 2015-11-30 ENCOUNTER — Ambulatory Visit: Payer: 59 | Attending: Orthopedic Surgery | Admitting: Physical Therapy

## 2015-11-30 ENCOUNTER — Encounter: Payer: Self-pay | Admitting: Physical Therapy

## 2015-11-30 DIAGNOSIS — M25511 Pain in right shoulder: Secondary | ICD-10-CM | POA: Insufficient documentation

## 2015-11-30 DIAGNOSIS — R293 Abnormal posture: Secondary | ICD-10-CM | POA: Insufficient documentation

## 2015-11-30 NOTE — Therapy (Signed)
[facility] [address] [place], [name], [address] Phone: [phone]   Fax:  [phone]  Physical Therapy Treatment  Patient Details  Name: Kelli Chapman MRN: [id number] Date of Birth: July 23, 1960 No Data Recorded  Encounter Date: 11/30/2015      PT End of Session - 11/30/15 0850    Visit Number 7   Number of Visits 13   Date for PT Re-Evaluation 12/05/15   PT Start Time 0805   PT Stop Time 0846   PT Time Calculation (min) 41 min   Activity Tolerance Patient tolerated treatment well;No increased pain   Behavior During Therapy WFL for tasks assessed/performed      Past Medical History:  Diagnosis Date  . Hypertension     History reviewed. No pertinent surgical history.  There were no vitals filed for this visit.      Subjective Assessment - 11/30/15 0807    Subjective Patient states she felt good after last PT session, but her pain increased at work again. She denies pain today but notes muscle soreness of supraspinatus/upper trap muscle. She states she is now on lifting restrictions at work; nothing >10#.    Pertinent History work duties   Limitations Lifting;House hold activities   Patient Stated Goals to get arm working better so she can continue to work overtime hours   Currently in Pain? Yes   Pain Score 0-No pain   Pain Location Shoulder   Pain Orientation Right   Pain Descriptors / Indicators Sore     Treatment:  Prone, grade II-III thoracic central PAs to T1-T9, 3x20 seconds each segment, ~10 minutes, hypomobile throughout, improved range by end. Instructed patient to perform thoracic extension over chair to maintain gains through manual therapy; 2x10, min VCs for initial body positioning and movement Active abduction, x2, patient notes increased pain at ~90 degrees; notes she had temporarily improved range with decreased pain last session. Prone soft tissue mobilization to supraspinatus/upper trap  to trigger points, x8 min.  Levator scapular stretch (modified, see patient instructions) to maintain soft tissue mobilization gains, 2x15 sec holds; min VCs for positioning and maintaining stretch not into pain; given in HEP.    L sidelying ER with towel at distal elbow, increased pain in biceps, x2 Soft tissue mobilization to biceps muscles x5 minutes After soft tissue mobilization, patient was able to perform sidelying ER x10, x8, min VCs to decreased shoulder extension range due to mild ant. Shoulder discomfort.  Bicep stretch, 2x30 sec holds, min VCs to continue performing at home  Standing plantigrade scapular strengthening: Scapular retractions, 2x10, 1# hand weight, min VCs for different positioning to decrease pain Low row, 2x10, 1# weight initially, increased to 2# hand weight, no increase in pain Seated ER, yellow tband, painful x1, discontinued Seated ranger, biceps pain with horizontal ABD, ant/post x5 no increase in pain, clockwise circles x5, no increase in pain.   At end of session, patient continues to report no pain just continued soreness.                                PT Education - 11/30/15 0849    Education provided Yes   Education Details levator stretch, continuing with post capsule stretch and biceps stretch    Person(s) Educated Patient   Methods Explanation;Handout;Demonstration;Verbal cues   Comprehension Verbalized understanding;Returned demonstration;Verbal cues required  PT Long Term Goals - 11/23/15 0849      PT LONG TERM GOAL #1   Title pt will have decreased QuickDash score by >8 points to demonstrate decreased pain and improved overall function with work and household duties.   Baseline 59   Time 6   Period Weeks   Status New     PT LONG TERM GOAL #2   Title pt will have improved R shoulder abd to 170 deg with <2/10 pain to demonstrate improved overall function and improve ability to perform work duties.    Baseline 75 deg, limited due to pain, 80 degrees pain limiteed (11/23/15)   Time 6   Period Weeks   Status On-going     PT LONG TERM GOAL #3   Title pt will be independent with HEP to promote recovery and prevent reinjury.   Baseline i   Time 6   Period Weeks   Status New               Plan - 11/30/15 0850    Clinical Impression Statement Patient continues to report difficulty with R shoulder abduction, but denies pain only soreness at suprapspinatus/upper trap muscle. The patient conitnues to have increase soft tissues restrictions with trigger points in the supraspinatus/upper trap as well as bicep muscle tightness. After soft tissue mobilization, patient is able to perform ER without pain although ABD is still painful and limited. Patient would continue to benefit from skilled PT in order to address RUE pain, muscles tightness, strength, and increase her ability to use her RUE functionally.    Rehab Potential Good   Clinical Impairments Affecting Rehab Potential amount of work per week (130-150 hours/week), however, pt is motivated and needs to improve to contiue working.   PT Frequency 2x / week   PT Duration 6 weeks   PT Treatment/Interventions ADLs/Self Care Home Management;Cryotherapy;Electrical Stimulation;Functional mobility training;Therapeutic activities;Therapeutic exercise;Neuromuscular re-education;Patient/family education;Manual techniques;Passive range of motion;Dry needling   PT Next Visit Plan continue addressing posture, manual techniques including grade I-II shoulder mobs, strengthening, soft tissue mobilization   Consulted and Agree with Plan of Care Patient      Patient will benefit from skilled therapeutic intervention in order to improve the following deficits and impairments:  Decreased activity tolerance, Decreased range of motion, Decreased strength, Hypomobility, Increased muscle spasms, Impaired UE functional use, Improper body mechanics, Postural  dysfunction, Pain  Visit Diagnosis: Pain in right shoulder  Abnormal posture     Problem List Patient Active Problem List   Diagnosis Date Noted  . HBP (high blood pressure) 01/17/2015  . Obesity 01/17/2015   Kelli Chapman, SPT This entire session was performed under direct supervision and direction of a licensed therapist/therapist assistant . I have personally read, edited and approve of the note as written.  Trotter,Margaret PT, DPT 11/30/2015, 9:17 AM  [facility] MAIN [facility] SERVICES [address] [place], [place], [address] Phone: [phone]   Fax:  [phone]  Name: Kelli Chapman MRN: [id number] Date of Birth: 12/01/1960

## 2015-11-30 NOTE — Patient Instructions (Addendum)
Levator Scapula Stretch, Sitting    Sit, one hand on same-side shoulder blade, other hand on head. Gently pull head down and away. Hold _15__ seconds. Repeat _3__ times per session. Do _2__ sessions per day.  Instructed to leave hands below and have R hand placed on underside of chair to stabilize.   Copyright  VHI. All rights reserved.

## 2015-12-05 ENCOUNTER — Ambulatory Visit: Payer: 59 | Admitting: Physical Therapy

## 2015-12-05 ENCOUNTER — Encounter: Payer: Self-pay | Admitting: Physical Therapy

## 2015-12-05 DIAGNOSIS — M25511 Pain in right shoulder: Secondary | ICD-10-CM

## 2015-12-05 DIAGNOSIS — R293 Abnormal posture: Secondary | ICD-10-CM

## 2015-12-05 NOTE — Therapy (Signed)
[place] [facility] PHYSICAL AND SPORTS MEDICINE [address] [place], [name], [address] Phone: [phone]   Fax:  [phone]  Physical Therapy Treatment  Patient Details  Name: Kelli CrutchMary C Chapman MRN: [id number] Date of Birth: 07/13/1960 No Data Recorded  Encounter Date: 12/05/2015      PT End of Session - 12/05/15 0803    Visit Number 8   Number of Visits 13   Date for PT Re-Evaluation 12/05/15   PT Start Time 0800   PT Stop Time 0850   PT Time Calculation (min) 50 min   Activity Tolerance Patient tolerated treatment well;No increased pain   Behavior During Therapy WFL for tasks assessed/performed      Past Medical History:  Diagnosis Date  . Hypertension     History reviewed. No pertinent surgical history.  There were no vitals filed for this visit.      Subjective Assessment - 12/05/15 0801    Subjective Pt reports she has felt horrible and has not been to work since last Wednesday. She reports she feels better today but it was really painful over the weekend.   Pertinent History work duties   Limitations Lifting;House hold activities   Patient Stated Goals to get arm working better so she can continue to work overtime hours   Currently in Pain? Yes   Pain Score 4    Pain Location Shoulder   Pain Orientation Right   Pain Descriptors / Indicators Nagging       Manual: Soft tissue mobilization with cross friction and efflurage to R middle/posterior delt, RTC insertion, and lateral biceps x 12 mins  Therex: Neers: +RUE Hawkins-Kennedy: +RUE R ER MMT: 4/5, recreated pain R IR MMT: 5/5, recreated pain AROM: 98 flex pain 33 abd pain  PROM: Approx. 150 flex pain at end range Approx. 125 abd pain at end range   Sidelying R shoulder abd and flexion with short lever arm, 3x15-20 each throughout pain-free AROM; pt had improved AROM during last set with each; pt had increased pain with shoulder flexion and reported that applying  pressure to distal middle delt/RTC insertion eliminated pain with AROM; during last set, pt able to complete full AROM shoulder flexion with no pain and without need for manual compression   Seated R shoulder flexion and abd with mirror emphasizing decreased UT compensation with each direction through pain-free AROM; added to HEP       PT Education - 12/05/15 1002    Education provided Yes   Education Details updated HEP, shoulder elevation without UT compensation and in pain-free range   Person(s) Educated Patient   Methods Explanation   Comprehension Verbalized understanding             PT Long Term Goals - 11/23/15 0849      PT LONG TERM GOAL #1   Title pt will have decreased QuickDash score by >8 points to demonstrate decreased pain and improved overall function with work and household duties.   Baseline 59   Time 6   Period Weeks   Status New     PT LONG TERM GOAL #2   Title pt will have improved R shoulder abd to 170 deg with <2/10 pain to demonstrate improved overall function and improve ability to perform work duties.   Baseline 75 deg, limited due to pain, 80 degrees pain limiteed (11/23/15)   Time 6   Period Weeks   Status On-going     PT LONG TERM GOAL #  3   Title pt will be independent with HEP to promote recovery and prevent reinjury.   Baseline i   Time 6   Period Weeks   Status New               Plan - 12/05/15 1003    Clinical Impression Statement Pt presented to therapy today with c/o increased shoulder pain following last treatment sessions. SPT and PT reassessed pt this date and pt had positive Neer's, positive Hawkins-Kennedy, pain with ER MMT, and UT compensation with shoulder flexion and abduction. Pt's AC joint ruled out this date. Pt noted to have increased UT compensation and poor scapular kinematics with OH movements and pt educated to perform pain-free AROM at home in a mirror to ensure decreased shoulder shrug with elevation. Pt going  back to her doctor Friday 9/10 and may receive cortisone shot. Pt needs cotinued skilled PT intervention to maximize overall fucntion and decrease pain.   Rehab Potential Good   Clinical Impairments Affecting Rehab Potential amount of work per week (130-150 hours/week), however, pt is motivated and needs to improve to contiue working.   PT Frequency 2x / week   PT Duration 6 weeks   PT Treatment/Interventions ADLs/Self Care Home Management;Cryotherapy;Electrical Stimulation;Functional mobility training;Therapeutic activities;Therapeutic exercise;Neuromuscular re-education;Patient/family education;Manual techniques;Passive range of motion;Dry needling   PT Next Visit Plan continue addressing posture, manual techniques including grade I-II shoulder mobs, strengthening, soft tissue mobilization   Consulted and Agree with Plan of Care Patient      Patient will benefit from skilled therapeutic intervention in order to improve the following deficits and impairments:  Decreased activity tolerance, Decreased range of motion, Decreased strength, Hypomobility, Increased muscle spasms, Impaired UE functional use, Improper body mechanics, Postural dysfunction, Pain  Visit Diagnosis: Pain in right shoulder  Abnormal posture     Problem List Patient Active Problem List   Diagnosis Date Noted  . HBP (high blood pressure) 01/17/2015  . Obesity 01/17/2015   Jac Canavan, SPT  Jac Canavan 12/05/2015, 10:15 AM  [facility] REGIONAL [facility] PHYSICAL AND SPORTS MEDICINE [address] [place], [address] Phone: [phone]   Fax:  [phone]  Name: Kelli Chapman MRN: [id number] Date of Birth: 07/05/1960

## 2015-12-07 ENCOUNTER — Encounter: Payer: Self-pay | Admitting: Physical Therapy

## 2015-12-07 ENCOUNTER — Ambulatory Visit: Payer: 59 | Admitting: Physical Therapy

## 2015-12-12 ENCOUNTER — Encounter: Payer: Self-pay | Admitting: Physical Therapy

## 2015-12-12 ENCOUNTER — Ambulatory Visit: Payer: 59 | Admitting: Physical Therapy

## 2015-12-12 DIAGNOSIS — R293 Abnormal posture: Secondary | ICD-10-CM

## 2015-12-12 DIAGNOSIS — M25511 Pain in right shoulder: Secondary | ICD-10-CM | POA: Diagnosis not present

## 2015-12-12 NOTE — Therapy (Signed)
[place] [facility] PHYSICAL AND SPORTS MEDICINE [address] [place], [name], [address] Phone: [phone]   Fax:  [phone]  Physical Therapy Treatment  Patient Details  Name: Kelli Chapman MRN: [id number] Date of Birth: 11-23-1960 No Data Recorded  Encounter Date: 12/12/2015      PT End of Session - 12/12/15 0801    Visit Number 9   Number of Visits 13   Date for PT Re-Evaluation 12/05/15   PT Start Time 0800   PT Stop Time 0848   PT Time Calculation (min) 48 min   Activity Tolerance Patient tolerated treatment well;No increased pain   Behavior During Therapy WFL for tasks assessed/performed      Past Medical History:  Diagnosis Date  . Hypertension     History reviewed. No pertinent surgical history.  There were no vitals filed for this visit.      Subjective Assessment - 12/12/15 0813    Subjective Pt reports she is feeling better. States she only feels pain at night when she is laying down or trying to change positions in bed. She reports she was able to work with minimal to no pain the past few days because she is not thnking about her shoulder and "doing what she has to do."   Pertinent History work duties   Limitations Lifting;House hold activities   Patient Stated Goals to get arm working better so she can continue to work overtime hours   Currently in Pain? Yes   Pain Score 1    Pain Location Shoulder   Pain Orientation Right   Pain Descriptors / Indicators Aching     Therex: sidelying R shoulder abd and flexion with scapular assist (scapular depression and upward rotation) from SPT, 2x20 each; pt pain-free throughout with scapular assist  Seated AROM shoulder flexion and abd with mirror x 5 mins total; mod tactile cues for decreased UT compensation  UE Ranger with axis point at shoulder level; R shoulder flexion x 2 mins; pt reported increased pain and pointed anterolateral shoulder; SPT clarified with pt that it was a  feeling of muscle fatigue more than "pain" and pt verbalized agreement  Assessed how pt lays in bed at home and made modifications as necessary; educated pt to lie on L side and on back to prevent unnecessary stress on R shoulder  Manual: Cross friction soft tissue mobilization to R anterior shoulder/long head biceps tendon x 8 mins; pt reported "stinging" during manual so SPT lightened pressure which pt said helped      PT Education - 12/12/15 0851    Education provided Yes   Education Details continue HEP given last week, lay on L side when lying in bed relaxing, try sleeping mainly on L side and on back   Person(s) Educated Patient   Methods Explanation   Comprehension Verbalized understanding             PT Long Term Goals - 11/23/15 0849      PT LONG TERM GOAL #1   Title pt will have decreased QuickDash score by >8 points to demonstrate decreased pain and improved overall function with work and household duties.   Baseline 59   Time 6   Period Weeks   Status New     PT LONG TERM GOAL #2   Title pt will have improved R shoulder abd to 170 deg with <2/10 pain to demonstrate improved overall function and improve ability to perform work duties.  Baseline 75 deg, limited due to pain, 80 degrees pain limiteed (11/23/15)   Time 6   Period Weeks   Status On-going     PT LONG TERM GOAL #3   Title pt will be independent with HEP to promote recovery and prevent reinjury.   Baseline i   Time 6   Period Weeks   Status New               Plan - 12/12/15 1610    Clinical Impression Statement Pt presented to therapy today with reports of decreased R shoulder pain, stating that it only hurts her at night when she's home because when she's at work she doesn't think about her shoulder hurting and "does what she has to do" at work. Pt AROM continues to demonstrate increased UT compensation for flexion and abd and minimal pain at end range. Pt demonstrated how she lays and  sleeps in the bed; pt typically lays on R side to watch TV and sleeps on her stomach. SPT educated pt to move pillow to other end of bed so that she is lying on her L side when watching TV and to try to sleep in this position or on her back to take the stress off of her R shoulder and prevent her shoulders from rounding forward when sleeping on stomach. Pt verbalized understanding and stated she would try this between now and her next session. Pt needs continued skilled PT intervention to maximize overall function.   Rehab Potential Good   Clinical Impairments Affecting Rehab Potential amount of work per week (130-150 hours/week), however, pt is motivated and needs to improve to contiue working.   PT Frequency 2x / week   PT Duration 6 weeks   PT Treatment/Interventions ADLs/Self Care Home Management;Cryotherapy;Electrical Stimulation;Functional mobility training;Therapeutic activities;Therapeutic exercise;Neuromuscular re-education;Patient/family education;Manual techniques;Passive range of motion;Dry needling   PT Next Visit Plan continue addressing posture, manual techniques including grade I-II shoulder mobs, strengthening, soft tissue mobilization   Consulted and Agree with Plan of Care Patient      Patient will benefit from skilled therapeutic intervention in order to improve the following deficits and impairments:  Decreased activity tolerance, Decreased range of motion, Decreased strength, Hypomobility, Increased muscle spasms, Impaired UE functional use, Improper body mechanics, Postural dysfunction, Pain  Visit Diagnosis: Pain in right shoulder  Abnormal posture     Problem List Patient Active Problem List   Diagnosis Date Noted  . HBP (high blood pressure) 01/17/2015  . Obesity 01/17/2015   Jac Canavan, SPT  Jac Canavan 12/12/2015, 9:10 AM  [place] [facility] REGIONAL [facility] PHYSICAL AND SPORTS MEDICINE [address] [place], [address] Phone:  [phone]   Fax:  [phone]  Name: Kelli Chapman MRN: [id number] Date of Birth: [birth year]

## 2015-12-14 ENCOUNTER — Encounter: Payer: Self-pay | Admitting: Physical Therapy

## 2015-12-14 ENCOUNTER — Ambulatory Visit: Payer: 59 | Admitting: Physical Therapy

## 2015-12-14 DIAGNOSIS — M25511 Pain in right shoulder: Secondary | ICD-10-CM | POA: Diagnosis not present

## 2015-12-14 DIAGNOSIS — R293 Abnormal posture: Secondary | ICD-10-CM | POA: Diagnosis not present

## 2015-12-14 NOTE — Therapy (Signed)
[place] MAIN [facility] SERVICES [address] [place], [place], [address] Phone: [phone]   Fax:  [phone]  Physical Therapy Treatment/Progress Note  Patient Details  Name: Kelli Chapman MRN: [id number] Date of Birth: [birth year] No Data Recorded  Encounter Date: 12/14/2015      PT End of Session - 12/14/15 0905    Visit Number 10   Number of Visits 25   Date for PT Re-Evaluation 01/11/16   PT Start Time 0802   PT Stop Time 0846   PT Time Calculation (min) 44 min   Activity Tolerance Patient tolerated treatment well;No increased pain   Behavior During Therapy WFL for tasks assessed/performed      Past Medical History:  Diagnosis Date  . Hypertension     History reviewed. No pertinent surgical history.  There were no vitals filed for this visit.      Subjective Assessment - 12/14/15 0804    Subjective Patient reports she has no changes in her status. She has changed her sleeping pattern and states that has helped some. She states she has decreased her work hours.    Pertinent History work duties   Limitations Lifting;House hold activities   Patient Stated Goals to get arm working better so she can continue to work overtime hours   Currently in Pain? Yes   Pain Score 1    Pain Location Shoulder   Pain Orientation Right   Pain Descriptors / Indicators Aching      Treatment:  Patient was instructed on QuickDash and range of motion assessment to assess goals. Quick Dash: 54.5, improved from initial at 59  Shoulder flexion: [facility] Shoulder abduction: 87 degrees increased to 3/10,improved from initial at 75 degrees.    Manual Therapy: Patient positioned in L sidelying with SPT performing soft tissue mobilization (cross friction massage) to the proximal deltoid, proximal biceps, and supraspinatus. Patient has tightness in deltoid and biceps with trigger points in the supraspinatus muscle. Performed for ~24 minutes total (before and  after sidelying ROM due to increase in pain) Patient has less pain with movement after soft tissue mobilization.  Sidelying AROM into R shoulder flexion and abduction with short lever arm and long lever arm; ~10 minutes with rest breaks after every 10-12 reps.  Increase in pain during long lever arm abduction even with limited motion so discontinued further motion for today.    Instructed patient about continuing with POC, sleeping changes, and heating for pain relief.  During seated AROM patient was instructed to maintain good posture since she sat in a slumped position with rounded shoulders without VCs.                              PT Education - 12/14/15 0907    Education provided Yes   Education Details continuing with heat and sleeping changes, continuing POC   Person(s) Educated Patient   Methods Explanation   Comprehension Verbalized understanding             PT Long Term Goals - 12/14/15 0855      PT LONG TERM GOAL #1   Title pt will have decreased QuickDash score by >8 points to demonstrate decreased pain and improved overall function with work and household duties.   Baseline 59; 54.5 (12/14/15)   Time 4   Period Weeks   Status Partially Met     PT LONG TERM GOAL #2  Title pt will have improved R shoulder abd to 170 deg with <2/10 pain to demonstrate improved overall function and improve ability to perform work duties.   Baseline 75 deg, limited due to pain, 80 degrees pain limiteed (11/23/15); 87 degrees limited by pain at 3/10 (12/14/15)   Time 4   Period Weeks   Status Partially Met     PT LONG TERM GOAL #3   Title pt will be independent with HEP to promote recovery and prevent reinjury.   Baseline i   Time 4   Period Weeks   Status On-going               Plan - 12/14/15 0859    Clinical Impression Statement Patient presented to PT today with 1/10 pain. She notes that she is frustrated due to not progressing quicker; however,  she also realizes the improvements she has made. Patient's goals and outcome measures were re-assessed. She is making improvements in range of motion, pain levels, and the QuickDash slowly.  She denies pain with shoulder flexion but continues to have pain during shoulder abduction. Patient continues to have trigger points and muscle tightness in her R supraspinatus, proximal deltoid, and proximal biceps. The patient would continue to benefit from skilled PT in order to address limited range of motion and pain.    Rehab Potential Good   Clinical Impairments Affecting Rehab Potential amount of work per week (130-150 hours/week), however, pt is motivated and needs to improve to contiue working.   PT Frequency 2x / week   PT Duration 4 weeks   PT Treatment/Interventions ADLs/Self Care Home Management;Cryotherapy;Electrical Stimulation;Functional mobility training;Therapeutic activities;Therapeutic exercise;Neuromuscular re-education;Patient/family education;Manual techniques;Passive range of motion;Dry needling   PT Next Visit Plan continue addressing posture, manual techniques including grade I-II shoulder mobs, strengthening, soft tissue mobilization   Consulted and Agree with Plan of Care Patient      Patient will benefit from skilled therapeutic intervention in order to improve the following deficits and impairments:  Decreased activity tolerance, Decreased range of motion, Decreased strength, Hypomobility, Increased muscle spasms, Impaired UE functional use, Improper body mechanics, Postural dysfunction, Pain  Visit Diagnosis: Pain in right shoulder - Plan: PT plan of care cert/re-cert  Abnormal posture - Plan: PT plan of care cert/re-cert     Problem List Patient Active Problem List   Diagnosis Date Noted  . HBP (high blood pressure) 01/17/2015  . Obesity 01/17/2015   Tilman Neat, SPT This entire session was performed under direct supervision and direction of a licensed  therapist/therapist assistant . I have personally read, edited and approve of the note as written.  Trotter,Margaret PT, DPT 12/14/2015, 10:08 AM  [place] MAIN [facility] SERVICES [address] [place], [place], [address] Phone: [phone]   Fax:  [phone]  Name: Kelli Chapman MRN: [id number] Date of Birth: 01-14-1961

## 2015-12-19 ENCOUNTER — Encounter: Payer: 59 | Admitting: Physical Therapy

## 2015-12-19 ENCOUNTER — Encounter: Payer: Self-pay | Admitting: Physical Therapy

## 2015-12-19 ENCOUNTER — Ambulatory Visit: Payer: 59 | Admitting: Physical Therapy

## 2015-12-19 DIAGNOSIS — M25511 Pain in right shoulder: Secondary | ICD-10-CM | POA: Diagnosis not present

## 2015-12-19 DIAGNOSIS — R293 Abnormal posture: Secondary | ICD-10-CM | POA: Diagnosis not present

## 2015-12-19 NOTE — Therapy (Signed)
[place] [facility] [address] [place], [address] Phone: [phone]   Fax:  [phone]  Physical Therapy Treatment  Patient Details  Name: Kelli Chapman MRN: [id number] Date of Birth: 10/08/1960 No Data Recorded  Encounter Date: 12/19/2015      PT End of Session - 12/19/15 1208    Visit Number 11   Number of Visits 25   Date for PT Re-Evaluation 01/11/16   PT Start Time 5701   PT Stop Time 1117   PT Time Calculation (min) 45 min   Activity Tolerance Patient tolerated treatment well;No increased pain   Behavior During Therapy WFL for tasks assessed/performed      Past Medical History:  Diagnosis Date  . Hypertension     History reviewed. No pertinent surgical history.  There were no vitals filed for this visit.      Subjective Assessment - 12/19/15 1219    Subjective Pt reports her shoulder pain has flared up again. She states that the change in sleeping position helped initially but it has not made a difference in the past few days.   Pertinent History work duties   Limitations Lifting;House hold activities   Patient Stated Goals to get arm working better so she can continue to work overtime hours   Currently in Pain? Yes   Pain Score 4    Pain Location Shoulder   Pain Orientation Right   Pain Descriptors / Indicators Constant      Manual: soft tissue to R deltoid and lateral biceps muscle belly x 23 mins; pt reported slight increase in pain but verbalizes that it feels better the next day  Therex: Scap retraction with ER with RTB, 3x10  Extensive education on RTC, shoulder and scapular kinematics, and proper sitting posture  Assessed pt's R shoulder extension to r/o frozen shoulder; pt AROM WNL and non-painful      PT Education - 12/19/15 1220    Education provided Yes   Education Details HEP, proper sitting posture, RTC function and other education reguarding shoulder kinematics    Person(s) Educated Patient   Methods Explanation   Comprehension Verbalized understanding             PT Long Term Goals - 12/14/15 0855      PT LONG TERM GOAL #1   Title pt will have decreased QuickDash score by >8 points to demonstrate decreased pain and improved overall function with work and household duties.   Baseline 59; 54.5 (12/14/15)   Time 4   Period Weeks   Status Partially Met     PT LONG TERM GOAL #2   Title pt will have improved R shoulder abd to 170 deg with <2/10 pain to demonstrate improved overall function and improve ability to perform work duties.   Baseline 75 deg, limited due to pain, 80 degrees pain limiteed (11/23/15); 87 degrees limited by pain at 3/10 (12/14/15)   Time 4   Period Weeks   Status Partially Met     PT LONG TERM GOAL #3   Title pt will be independent with HEP to promote recovery and prevent reinjury.   Baseline i   Time 4   Period Weeks   Status On-going               Plan - 12/19/15 1208    Clinical Impression Statement Pt verbally frustrated with progress again this date as she has had an increase in symptoms since  last treatment session. She reports that she has continued to have decreased work hours and work duties, but her shoulder has started to hurt more again. She had painful arc with flexion and abduction this date, but maintains full AROM in all directions. Pt asked about if it could be RTC tear and SPT educated pt that she did not have traumatic MOI and has full AROM in all directions; pt verbalized understanding. Pt also verblaized that she is returning to her doctor Friday and stated "I'm going to get him to do whatever it is he needs to do. Whether that's a shot or what, I've got to get rid of this pain cause I have to work."   Rehab Potential Good   Clinical Impairments Affecting Rehab Potential amount of work per week (130-150 hours/week), however, pt is motivated and needs to improve to contiue working.   PT  Frequency 2x / week   PT Duration 4 weeks   PT Treatment/Interventions ADLs/Self Care Home Management;Cryotherapy;Electrical Stimulation;Functional mobility training;Therapeutic activities;Therapeutic exercise;Neuromuscular re-education;Patient/family education;Manual techniques;Passive range of motion;Dry needling   PT Next Visit Plan continue addressing posture, manual techniques including grade I-II shoulder mobs, strengthening, soft tissue mobilization   Consulted and Agree with Plan of Care Patient      Patient will benefit from skilled therapeutic intervention in order to improve the following deficits and impairments:  Decreased activity tolerance, Decreased range of motion, Decreased strength, Hypomobility, Increased muscle spasms, Impaired UE functional use, Improper body mechanics, Postural dysfunction, Pain  Visit Diagnosis: Pain in right shoulder  Abnormal posture     Problem List Patient Active Problem List   Diagnosis Date Noted  . HBP (high blood pressure) 01/17/2015  . Obesity 01/17/2015   Geraldine Solar, SPT  Geraldine Solar 12/19/2015, 12:21 PM  [place] [facility] [address] [place], [address] Phone: [phone]   Fax:  [phone]  Name: CENIYA FOWERS MRN: [id number] Date of Birth: 1960-07-03

## 2015-12-21 ENCOUNTER — Encounter: Payer: Self-pay | Admitting: Physical Therapy

## 2015-12-21 ENCOUNTER — Ambulatory Visit: Payer: 59 | Admitting: Physical Therapy

## 2015-12-21 DIAGNOSIS — R293 Abnormal posture: Secondary | ICD-10-CM

## 2015-12-21 DIAGNOSIS — M25511 Pain in right shoulder: Secondary | ICD-10-CM

## 2015-12-21 DIAGNOSIS — M7541 Impingement syndrome of right shoulder: Secondary | ICD-10-CM | POA: Diagnosis not present

## 2015-12-21 NOTE — Therapy (Signed)
[place] MAIN [facility] SERVICES [address] [place], [place], [address] Phone: [phone]   Fax:  [phone]  Physical Therapy Treatment  Patient Details  Name: Kelli Chapman MRN: [id number] Date of Birth: 1960-05-03 No Data Recorded  Encounter Date: 12/21/2015      PT End of Session - 12/21/15 0936    Visit Number 12   Number of Visits 25   Date for PT Re-Evaluation 01/11/16   PT Start Time 0802   PT Stop Time 0846   PT Time Calculation (min) 44 min   Activity Tolerance Patient tolerated treatment well;No increased pain   Behavior During Therapy WFL for tasks assessed/performed      Past Medical History:  Diagnosis Date  . Hypertension     History reviewed. No pertinent surgical history.  There were no vitals filed for this visit.      Subjective Assessment - 12/21/15 0803    Subjective Pt reports her shoulder pain has flared up last night but she has no pain this morning. Patient notes depending on the day she will feel good/bad. Patient notes she has been having increased swelling in R shoulder lately.    Pertinent History work duties   Limitations Lifting;House hold activities   Patient Stated Goals to get arm working better so she can continue to work overtime hours   Currently in Pain? No/denies      Treatment: Manual: Sidelying soft tissue mobilization of R upper trap, supraspinatus, proximal biceps, and deltoid muscle. Patient denies tenderness but states that the mobilization helps to decrease her pain after. ~20 min. Supine soft tissue mobilization to proximal biceps, with shoulder supported by table in 30 degrees of ABD and full ER. ~4 min.   TherEx: SPT performed sidelying passive ROM into shoulder flexion, abduction, and extension with increased pain into shoulder flexion; pain d/c when out of position.  SPT performed PROM in supine to shoulder flexion, scaption, abduction, and IR/ER, x5 each direction. Patient  notes increased in pain when not relaxing muscle, resistance with passive ABD until patient performed AAROM she had improved range, may indicate patient guarding.  Seated shoulder ER and scapular retraction, 2x10, increased pain to 3/10 in biceps, instructed to perform with towel roll under distal elbow, denies pain afterwards. Min VCs to decrease R upper trap activation and improve R scapular retraction.                           PT Education - 12/21/15 0935    Education provided Yes   Education Details Possibly changing bra to assist with posture prn, improving quaninty of sleep   Person(s) Educated Patient   Methods Explanation   Comprehension Verbalized understanding             PT Long Term Goals - 12/14/15 0855      PT LONG TERM GOAL #1   Title pt will have decreased QuickDash score by >8 points to demonstrate decreased pain and improved overall function with work and household duties.   Baseline 59; 54.5 (12/14/15)   Time 4   Period Weeks   Status Partially Met     PT LONG TERM GOAL #2   Title pt will have improved R shoulder abd to 170 deg with <2/10 pain to demonstrate improved overall function and improve ability to perform work duties.   Baseline 75 deg, limited due to pain, 80 degrees pain limiteed (11/23/15); 87 degrees  limited by pain at 3/10 (12/14/15)   Time 4   Period Weeks   Status Partially Met     PT LONG TERM GOAL #3   Title pt will be independent with HEP to promote recovery and prevent reinjury.   Baseline i   Time 4   Period Weeks   Status On-going               Plan - 12/21/15 0936    Clinical Impression Statement Pt states her pain depends on the day, is not dependent on work activities, and is worse at night. She states her worst pain is 10/10. She had really bad pain last night, but she has none this morning except with overhead activities. During seated flexion patient reports an immediate increase to 5/10 pain  radiating down her arm, which does not improve with postural correction. Patient has full PROM into R shoulder flexion is very tight and limited into shoulder ABD until instructed to assist with the motion so pt may have been guarding. She has no pain when fully relaxed in passive ROM but notes a catching when perform supine/sidelying R shoulder flexion/extension when perform individually. Pt continues to have tight musculature in R shoulder but no tenderness to touch. As stated previously, patient noted that she would have the MD do what he needs to do in order to relieve the pain.     Rehab Potential Good   Clinical Impairments Affecting Rehab Potential amount of work per week (130-150 hours/week), however, pt is motivated and needs to improve to contiue working.   PT Frequency 2x / week   PT Duration 4 weeks   PT Treatment/Interventions ADLs/Self Care Home Management;Cryotherapy;Electrical Stimulation;Functional mobility training;Therapeutic activities;Therapeutic exercise;Neuromuscular re-education;Patient/family education;Manual techniques;Passive range of motion;Dry needling   PT Next Visit Plan continue addressing posture, manual techniques including grade I-II shoulder mobs, strengthening, soft tissue mobilization   Consulted and Agree with Plan of Care Patient      Patient will benefit from skilled therapeutic intervention in order to improve the following deficits and impairments:  Decreased activity tolerance, Decreased range of motion, Decreased strength, Hypomobility, Increased muscle spasms, Impaired UE functional use, Improper body mechanics, Postural dysfunction, Pain  Visit Diagnosis: Pain in right shoulder  Abnormal posture     Problem List Patient Active Problem List   Diagnosis Date Noted  . HBP (high blood pressure) 01/17/2015  . Obesity 01/17/2015   Tilman Neat, SPT This entire session was performed under direct supervision and direction of a licensed  therapist/therapist assistant . I have personally read, edited and approve of the note as written.  Trotter,Margaret PT, DPT 12/21/2015, 11:20 AM  [place] MAIN [facility] SERVICES [address] [place], [place], [address] Phone: [phone]   Fax:  [phone]  Name: DARYAN CAGLEY MRN: [id number] Date of Birth: 19-Oct-1960

## 2015-12-24 ENCOUNTER — Ambulatory Visit: Payer: 59

## 2015-12-24 VITALS — BP 153/88 | HR 78

## 2015-12-24 DIAGNOSIS — M25511 Pain in right shoulder: Secondary | ICD-10-CM

## 2015-12-24 DIAGNOSIS — R293 Abnormal posture: Secondary | ICD-10-CM | POA: Diagnosis not present

## 2015-12-24 NOTE — Therapy (Signed)
[place] MAIN [facility] SERVICES [address] [place], [place], [address] Phone: [phone]   Fax:  [phone]  Physical Therapy Treatment  Patient Details  Name: Kelli Chapman MRN: [id number] Date of Birth: [birth year] No Data Recorded  Encounter Date: 12/24/2015      PT End of Session - 12/24/15 1436    Visit Number 13   Number of Visits 25   Date for PT Re-Evaluation 01/11/16   PT Start Time 2440   PT Stop Time 1429   PT Time Calculation (min) 47 min   Activity Tolerance Patient tolerated treatment well;No increased pain   Behavior During Therapy WFL for tasks assessed/performed      Past Medical History:  Diagnosis Date  . Hypertension     History reviewed. No pertinent surgical history.  Vitals:   12/24/15 1349  BP: (!) 153/88  Pulse: 78  SpO2: 100%        Subjective Assessment - 12/24/15 1347    Subjective Pt saw her MD on Friday who has decided to move forward with MRI of R shoulder, date of MRI TBD.  Her MD prescribed her a muscle relaxer 1x/day and took the first one last night with reports of decrease in pain.  Pt reports she took her BP pill just before PT appointment.   Pertinent History work duties   Limitations Lifting;House hold activities   Patient Stated Goals to get arm working better so she can continue to work overtime hours   Currently in Pain? No/denies         TREATMENT  Therapeutic Exercise:  PROM R shoulder IR and ER (at 30 deg abd), F, Abd x5, painfree  Supine D2 PNF R shoulder AROM x10 in painfree range (pain if pt reaches farther into full flexion), repeated again with YTB 2x10 pt reports pain when returning to starting position (no resistance). Repeated with 2lb dumbell in sitting 1x10, pt reports decrease in pain compared to use of theraband.  Seated R shoulder ER/IR 2x20 in each direction with 4lb dumbell with towel roll, painfree  Standing low row with YTB 1x10 and GTB 2x10 with cues for  scapular retraction and depression, painfree with correct posture  High rows with YTB 2x10 with cues for scapular depression, painfree with correct posture  Seated scapular retractions with YTB 2x10 with max cues for posture and extensive education on body mechanics of shoulder and the effect of posture on impingement. Painfree with correct posture.  Standing R shoulder scaption with YTB 3x15, painfree                      PT Education - 12/24/15 1350    Education provided Yes   Education Details Exercise technique; posture   Person(s) Educated Patient   Methods Explanation;Demonstration;Tactile cues;Verbal cues   Comprehension Verbalized understanding;Returned demonstration             PT Long Term Goals - 12/14/15 0855      PT LONG TERM GOAL #1   Title pt will have decreased QuickDash score by >8 points to demonstrate decreased pain and improved overall function with work and household duties.   Baseline 59; 54.5 (12/14/15)   Time 4   Period Weeks   Status Partially Met     PT LONG TERM GOAL #2   Title pt will have improved R shoulder abd to 170 deg with <2/10 pain to demonstrate improved overall function and improve ability to perform  work duties.   Baseline 75 deg, limited due to pain, 80 degrees pain limiteed (11/23/15); 87 degrees limited by pain at 3/10 (12/14/15)   Time 4   Period Weeks   Status Partially Met     PT LONG TERM GOAL #3   Title pt will be independent with HEP to promote recovery and prevent reinjury.   Baseline i   Time 4   Period Weeks   Status On-going               Plan - 12/24/15 1437    Clinical Impression Statement Pt demonstrates painful arc with AROM which improves with introduction of resistance via theraband or dumbell.  Pt required max verbal and tactile cues for proper posture during exercises and extensive education on anatomy of shoulder and importance of posture.  Will continue with strengthening program in a low  pain or painfree range to promote improvements in strength and posture.     Rehab Potential Good   Clinical Impairments Affecting Rehab Potential amount of work per week (130-150 hours/week), however, pt is motivated and needs to improve to contiue working.   PT Frequency 2x / week   PT Duration 4 weeks   PT Treatment/Interventions ADLs/Self Care Home Management;Cryotherapy;Electrical Stimulation;Functional mobility training;Therapeutic activities;Therapeutic exercise;Neuromuscular re-education;Patient/family education;Manual techniques;Passive range of motion;Dry needling   PT Next Visit Plan continue addressing posture, strengthening   Consulted and Agree with Plan of Care Patient      Patient will benefit from skilled therapeutic intervention in order to improve the following deficits and impairments:  Decreased activity tolerance, Decreased range of motion, Decreased strength, Hypomobility, Increased muscle spasms, Impaired UE functional use, Improper body mechanics, Postural dysfunction, Pain  Visit Diagnosis: Pain in right shoulder  Abnormal posture     Problem List Patient Active Problem List   Diagnosis Date Noted  . HBP (high blood pressure) 01/17/2015  . Obesity 01/17/2015    Collie Siad PT, DPT 12/24/2015, 2:41 PM  [place] MAIN [facility] SERVICES [address] [place], [place], [address] Phone: [phone]   Fax:  [phone]  Name: Kelli Chapman MRN: [id number] Date of Birth: Aug 06, 1960

## 2015-12-25 ENCOUNTER — Other Ambulatory Visit: Payer: Self-pay | Admitting: Orthopedic Surgery

## 2015-12-25 DIAGNOSIS — M7541 Impingement syndrome of right shoulder: Secondary | ICD-10-CM

## 2015-12-25 DIAGNOSIS — M25511 Pain in right shoulder: Secondary | ICD-10-CM

## 2015-12-26 ENCOUNTER — Ambulatory Visit: Payer: 59 | Admitting: Physical Therapy

## 2015-12-26 ENCOUNTER — Encounter: Payer: Self-pay | Admitting: Physical Therapy

## 2015-12-26 DIAGNOSIS — R293 Abnormal posture: Secondary | ICD-10-CM

## 2015-12-26 DIAGNOSIS — M25511 Pain in right shoulder: Secondary | ICD-10-CM | POA: Diagnosis not present

## 2015-12-26 NOTE — Therapy (Signed)
[facility] [address], [place], [address] Phone: [phone]   Fax:  [phone]  Physical Therapy Treatment  Patient Details  Name: KHRYSTAL JEANMARIE MRN: [id number] Date of Birth: 1961-03-09 No Data Recorded  Encounter Date: 12/26/2015      PT End of Session - 12/26/15 0807    Visit Number 14   Number of Visits 25   Date for PT Re-Evaluation 01/11/16   PT Start Time 0802   PT Stop Time 1173   PT Time Calculation (min) 45 min   Activity Tolerance Patient tolerated treatment well;No increased pain   Behavior During Therapy WFL for tasks assessed/performed      Past Medical History:  Diagnosis Date  . Hypertension     History reviewed. No pertinent surgical history.  There were no vitals filed for this visit.      Subjective Assessment - 12/26/15 0806    Subjective Pt reports she has her MRI scheduled for October 9th for her R shoulder. She reports no pain in her shoulder this morning, unless she goes to move it.   Pertinent History work duties   Limitations Lifting;House hold activities   Patient Stated Goals to get arm working better so she can continue to work overtime hours   Currently in Pain? No/denies     Therex:   Sidelying ER with 3# DB, 3x10; pt had pain at higher ranges of ER so cued pt to not go as high which relieved pain   Bil scap retraction and ER with RTB, 3x10; cues for decreased shoulder shrug of RUE; pain-free  Band pulls with RTB (painful) in anterior shoulder  Prone Y's x 10 with no weight (too easy) which were pain-free; x 10 with 1# DB, pain-free, x 10 with 2# DB, painfree  Prone I's with 2# DB, 3x10, pain-free; tactile cues for decreased shoulder shrug  Prone rows with 4# DB, 3x10, pain-free; cues for decreased shoulder shrug  Single arm rows with x10 with 5# (too easy), 2x10 with 10#   Seated single arm pull downs with 10#, 3x10; pt initially had increased  anterior shoulder pain but pt noted to have improper posture; SPT cued pt to decreased forward rounded shoulders and maintain scapular retraction and depression throughout exercise and pt reported no pain with exercise when in proper position. At end of exercise, pt asked to try exercise again with incorrect/her comfortable posture to see if her pain could be recreated and after 2 reps with improper posture, pt reported increased pain in anterior shoulder again.  Standing Y's on wall with RTB, 3x10; pt reported no pain, required min cues to maintain scapular retraction and depression throughout      PT Education - 12/26/15 0905    Education provided Yes   Education Details if she reaches for an item or raises RUE and it hurts, try again after performing scapular depression and retraction and notive the difference; look into getting crossback bra for more support   Person(s) Educated Patient   Methods Explanation   Comprehension Verbalized understanding             PT Long Term Goals - 12/14/15 0855      PT LONG TERM GOAL #1   Title pt will have decreased QuickDash score by >8 points to demonstrate decreased pain and improved overall function with work and household duties.   Baseline 59; 54.5 (12/14/15)   Time 4   Period Weeks  Status Partially Met     PT LONG TERM GOAL #2   Title pt will have improved R shoulder abd to 170 deg with <2/10 pain to demonstrate improved overall function and improve ability to perform work duties.   Baseline 75 deg, limited due to pain, 80 degrees pain limiteed (11/23/15); 87 degrees limited by pain at 3/10 (12/14/15)   Time 4   Period Weeks   Status Partially Met     PT LONG TERM GOAL #3   Title pt will be independent with HEP to promote recovery and prevent reinjury.   Baseline i   Time 4   Period Weeks   Status On-going               Plan - 12/26/15 0908    Clinical Impression Statement Pt continues with R shoulder pain during  elevation into flexion and scaption. Pt given extensive education on importance of proper posture, which pt responded to by saying "no one sits like that. I can't sit like that, it's not comfortbal for me." SPT continued to educate pt on the importance of posture throughout the session as she had increased anterior shoulder pain when performing exercises if she was in poor posture. When SPT cued pt to perform scapular depression and retraction, she had almost no pain in anterior shoulder during exercises. Pt educated on importance of proper fitting bra and told pt to try a crossback bra to help distribute pressure through her back and help facilitate proper posture. Pt has MRI scheduled on October 9.    Rehab Potential Good   Clinical Impairments Affecting Rehab Potential amount of work per week (130-150 hours/week), however, pt is motivated and needs to improve to contiue working.   PT Frequency 2x / week   PT Duration 4 weeks   PT Treatment/Interventions ADLs/Self Care Home Management;Cryotherapy;Electrical Stimulation;Functional mobility training;Therapeutic activities;Therapeutic exercise;Neuromuscular re-education;Patient/family education;Manual techniques;Passive range of motion;Dry needling   PT Next Visit Plan continue addressing posture, strengthening   Consulted and Agree with Plan of Care Patient      Patient will benefit from skilled therapeutic intervention in order to improve the following deficits and impairments:  Decreased activity tolerance, Decreased range of motion, Decreased strength, Hypomobility, Increased muscle spasms, Impaired UE functional use, Improper body mechanics, Postural dysfunction, Pain  Visit Diagnosis: Pain in right shoulder  Abnormal posture     Problem List Patient Active Problem List   Diagnosis Date Noted  . HBP (high blood pressure) 01/17/2015  . Obesity 01/17/2015   Geraldine Solar, SPT  Geraldine Solar 12/26/2015, 9:14 AM  [facility] [address], [place], [address] Phone: [phone]   Fax:  [phone]  Name: DANILYNN JEMISON MRN: [id number] Date of Birth: 08-Mar-1961

## 2016-01-01 ENCOUNTER — Ambulatory Visit: Payer: 59 | Admitting: Physical Therapy

## 2016-01-02 ENCOUNTER — Ambulatory Visit: Payer: 59 | Attending: Orthopedic Surgery

## 2016-01-02 ENCOUNTER — Encounter: Payer: Self-pay | Admitting: Physical Therapy

## 2016-01-02 DIAGNOSIS — M25511 Pain in right shoulder: Secondary | ICD-10-CM | POA: Insufficient documentation

## 2016-01-02 DIAGNOSIS — R293 Abnormal posture: Secondary | ICD-10-CM | POA: Insufficient documentation

## 2016-01-02 NOTE — Therapy (Signed)
[facility] [address] [place], [address] Phone: [phone]   Fax:  [phone]  Physical Therapy Treatment  Patient Details  Name: Kelli Chapman MRN: [id number] Date of Birth: 1960/05/08 No Data Recorded  Encounter Date: 01/02/2016      PT End of Session - 01/02/16 0806    Visit Number 15   Number of Visits 25   Date for PT Re-Evaluation 01/11/16   PT Start Time 0804   PT Stop Time 0845   PT Time Calculation (min) 41 min   Activity Tolerance Patient tolerated treatment well;No increased pain   Behavior During Therapy WFL for tasks assessed/performed      Past Medical History:  Diagnosis Date  . Hypertension     History reviewed. No pertinent surgical history.  There were no vitals filed for this visit.      Subjective Assessment - 01/02/16 0805    Subjective Pt reports she feels good today. She states she took her medicine which helped her pain this morning. She states she worked yesterday and her shoulder was hurting so she took the meds which helped her fall asleep. She states she hears the clicking again but it's not hurting.   Pertinent History work duties   Limitations Lifting;House hold activities   Patient Stated Goals to get arm working better so she can continue to work overtime hours   Currently in Pain? No/denies     Manual pec minor stretch RUE , 5x45-60 sec bouts; pt reported feeling good stretch in appropriate muscles   Assessed R IR again this date and it was WNL, non-painful  Sidelying ER with towel roll under RUE with 3# DB, 3x10   Standing ER with towel roll with GTB, 3x10; cues for proper technique  Scaption with scapular depression and retraction with 2# DB, 3x10; cues for scapular depression and retraction  Dynamic hug with GTB; pt unable to get proper form even with max verbal and tactile cues so dc'd exercise  Plantigrade plank from elevated table height with  shoulder taps, 3x10 each; cues to keep hips down/decrease hip flexion   Y's with lift off on wall with RTB, 3x10; cues for proper technique   Yellow body blade, vertical and horizontal; initially had pt perform with elbow by side, 2x10 sec; too easy so had pt perform with arm straight out in front, 7x10 sec each; cues for scapular retraction  Standing D2 PNF flexion with YTB, 3x10; pt initially had pain with first few reps so cued pt for scapular retraction and depression, and pt reported no pain with activity following   R single-arm rowing with 10#, 3x10; cues for proper scapular depression and retraction      PT Education - 01/02/16 0848    Education provided Yes   Education Details exercise technique   Person(s) Educated Patient   Methods Explanation   Comprehension Verbalized understanding             PT Long Term Goals - 12/14/15 0855      PT LONG TERM GOAL #1   Title pt will have decreased QuickDash score by >8 points to demonstrate decreased pain and improved overall function with work and household duties.   Baseline 59; 54.5 (12/14/15)   Time 4   Period Weeks   Status Partially Met     PT LONG TERM GOAL #2   Title pt will have improved R shoulder abd to 170 deg with <  2/10 pain to demonstrate improved overall function and improve ability to perform work duties.   Baseline 75 deg, limited due to pain, 80 degrees pain limiteed (11/23/15); 87 degrees limited by pain at 3/10 (12/14/15)   Time 4   Period Weeks   Status Partially Met     PT LONG TERM GOAL #3   Title pt will be independent with HEP to promote recovery and prevent reinjury.   Baseline i   Time 4   Period Weeks   Status On-going               Plan - 01/02/16 0850    Clinical Impression Statement Pt presented to therapy this date with no shoulder pain, which she reports is from taking pain meds last night. She tolerated shoulder strengthening well this date but continued to require cues for  scapula retraction and depression throughout exercises in order to minimize anterior shoulder pain. Pt has MRI scheduled for Monday, 10/9.    Rehab Potential Good   Clinical Impairments Affecting Rehab Potential amount of work per week (130-150 hours/week), however, pt is motivated and needs to improve to contiue working.   PT Frequency 2x / week   PT Duration 4 weeks   PT Treatment/Interventions ADLs/Self Care Home Management;Cryotherapy;Electrical Stimulation;Functional mobility training;Therapeutic activities;Therapeutic exercise;Neuromuscular re-education;Patient/family education;Manual techniques;Passive range of motion;Dry needling   PT Next Visit Plan continue addressing posture, strengthening   Consulted and Agree with Plan of Care Patient      Patient will benefit from skilled therapeutic intervention in order to improve the following deficits and impairments:  Decreased activity tolerance, Decreased range of motion, Decreased strength, Hypomobility, Increased muscle spasms, Impaired UE functional use, Improper body mechanics, Postural dysfunction, Pain  Visit Diagnosis: Acute pain of right shoulder  Abnormal posture     Problem List Patient Active Problem List   Diagnosis Date Noted  . HBP (high blood pressure) 01/17/2015  . Obesity 01/17/2015   This entire session was performed under direct supervision and direction of a licensed therapist/therapist assistant . I have personally read, edited and approve of the note as written.   Geraldine Solar, SPT Phillips Grout PT, DPT   Huprich,Jason 01/03/2016, 10:28 AM  [facility] [address] [place], [address] Phone: [phone]   Fax:  [phone]  Name: MALLORY SCHAAD MRN: [id number] Date of Birth: [birth year]

## 2016-01-07 ENCOUNTER — Ambulatory Visit: Payer: 59

## 2016-01-07 ENCOUNTER — Ambulatory Visit
Admission: RE | Admit: 2016-01-07 | Discharge: 2016-01-07 | Disposition: A | Discharge status: Home / Self Care | Payer: 59 | Source: Ambulatory Visit | Attending: Orthopedic Surgery | Admitting: Orthopedic Surgery

## 2016-01-07 ENCOUNTER — Encounter: Payer: Self-pay | Admitting: Physical Therapy

## 2016-01-07 DIAGNOSIS — M7591 Shoulder lesion, unspecified, right shoulder: Secondary | ICD-10-CM | POA: Diagnosis not present

## 2016-01-07 DIAGNOSIS — M75101 Unspecified rotator cuff tear or rupture of right shoulder, not specified as traumatic: Secondary | ICD-10-CM | POA: Diagnosis not present

## 2016-01-07 DIAGNOSIS — M7541 Impingement syndrome of right shoulder: Secondary | ICD-10-CM | POA: Diagnosis not present

## 2016-01-07 DIAGNOSIS — R293 Abnormal posture: Secondary | ICD-10-CM

## 2016-01-07 DIAGNOSIS — M7551 Bursitis of right shoulder: Secondary | ICD-10-CM | POA: Diagnosis not present

## 2016-01-07 DIAGNOSIS — M25511 Pain in right shoulder: Secondary | ICD-10-CM

## 2016-01-07 NOTE — Therapy (Signed)
[facility] [address], [place], [address] Phone: [phone]   Fax:  [phone]  Physical Therapy Treatment  Patient Details  Name: Kelli Chapman MRN: [id number] Date of Birth: [birth year] No Data Recorded  Encounter Date: 01/07/2016      PT End of Session - 01/07/16 1536    Visit Number 16   Number of Visits 25   Date for PT Re-Evaluation 01/11/16   PT Start Time 8341   PT Stop Time 1610   PT Time Calculation (min) 40 min   Activity Tolerance Patient tolerated treatment well;No increased pain   Behavior During Therapy WFL for tasks assessed/performed      Past Medical History:  Diagnosis Date  . Hypertension     History reviewed. No pertinent surgical history.  There were no vitals filed for this visit.      Subjective Assessment - 01/07/16 1531    Subjective Pt reports she had her MRI this morning; she is going back to her doctor Thursday for the results. She states "the clicking is back." She also reports that she has been trying to maintain proper posture.   Pertinent History work duties   Limitations Lifting;House hold activities   Patient Stated Goals to get arm working better so she can continue to work overtime hours   Currently in Pain? No/denies     Therex: Sidelying ER with towel roll under elbow with 3# DB, 3x10  Prone rowing with 3# DB, 3x10; cues to perform scapular retraction simultaneously   Prone I's with 2# DB and Y's with no weight, 3x10 each; attempted Y's with 2# DB but pt had increased difficulty and increased pain with this so had pt perform with no weight  Y's on wall with lift off with RTB x14, 2x10  TRX rows, 3x10; cues for proper technique; pt reported this activity as easy but did not want to increase difficulty due to "fear of ropes coming out of door"  TRX middle rows, x 15; cues for proper technique, again, too easy but pt did not want to increase difficulty  due to "fear of ropes coming out of door"  Plantigrade position from lowest table position with shoulder taps, 3x10; cues to decrease hip flexion throughout; pt had increased difficulty from this lowered position. Attempted shoulder taps from full plank position but pt stated "my R arm isn't going to hold me up. It's just not gone do it." so regressed exercise to plantigrade position  R D2 flexion PNF with RTB, 3x10; cues to perform scap retraction prior to elevating arm   Small body blade with arm straight out in front, slightly below shoulder level, and vertical and horizontal perturbations, 5x 10-15 sec bouts each direction       PT Education - 01/07/16 1612    Education provided Yes   Education Details exercise technique   Person(s) Educated Patient   Methods Explanation   Comprehension Verbalized understanding             PT Long Term Goals - 12/14/15 0855      PT LONG TERM GOAL #1   Title pt will have decreased QuickDash score by >8 points to demonstrate decreased pain and improved overall function with work and household duties.   Baseline 59; 54.5 (12/14/15)   Time 4   Period Weeks   Status Partially Met     PT LONG TERM GOAL #2   Title pt will have  improved R shoulder abd to 170 deg with <2/10 pain to demonstrate improved overall function and improve ability to perform work duties.   Baseline 75 deg, limited due to pain, 80 degrees pain limiteed (11/23/15); 87 degrees limited by pain at 3/10 (12/14/15)   Time 4   Period Weeks   Status Partially Met     PT LONG TERM GOAL #3   Title pt will be independent with HEP to promote recovery and prevent reinjury.   Baseline i   Time 4   Period Weeks   Status On-going               Plan - 01/07/16 1613    Clinical Impression Statement Pt continues to demonstrate improper scapular kinematics during UE elevation and strengthening exercises as demo by continued need for cueing for scap retraction and depression  throughout all exercises. Pt tolerated strengthening well this date and needs continued skilled PT intervention to maximize overall function.   Rehab Potential Good   Clinical Impairments Affecting Rehab Potential amount of work per week (130-150 hours/week), however, pt is motivated and needs to improve to contiue working.   PT Frequency 2x / week   PT Duration 4 weeks   PT Treatment/Interventions ADLs/Self Care Home Management;Cryotherapy;Electrical Stimulation;Functional mobility training;Therapeutic activities;Therapeutic exercise;Neuromuscular re-education;Patient/family education;Manual techniques;Passive range of motion;Dry needling   PT Next Visit Plan continue addressing posture, strengthening   Consulted and Agree with Plan of Care Patient      Patient will benefit from skilled therapeutic intervention in order to improve the following deficits and impairments:  Decreased activity tolerance, Decreased range of motion, Decreased strength, Hypomobility, Increased muscle spasms, Impaired UE functional use, Improper body mechanics, Postural dysfunction, Pain  Visit Diagnosis: Acute pain of right shoulder  Abnormal posture     Problem List Patient Active Problem List   Diagnosis Date Noted  . HBP (high blood pressure) 01/17/2015  . Obesity 01/17/2015   This entire session was performed under direct supervision and direction of a licensed therapist/therapist assistant . I have personally read, edited and approve of the note as written.   Geraldine Solar, SPT Phillips Grout PT, DPT   Huprich,Jason 01/07/2016, 5:14 PM  [facility] [address], [place], [address] Phone: [phone]   Fax:  [phone]  Name: Kelli Chapman MRN: [id number] Date of Birth: 1960/08/09

## 2016-01-09 ENCOUNTER — Encounter: Payer: Self-pay | Admitting: Physical Therapy

## 2016-01-09 ENCOUNTER — Ambulatory Visit: Payer: 59 | Admitting: Physical Therapy

## 2016-01-09 DIAGNOSIS — R293 Abnormal posture: Secondary | ICD-10-CM

## 2016-01-09 DIAGNOSIS — M25511 Pain in right shoulder: Secondary | ICD-10-CM

## 2016-01-09 NOTE — Therapy (Signed)
[facility] [address] [place], [address] Phone: [phone]   Fax:  [phone]  Physical Therapy Treatment  Patient Details  Name: Kelli Chapman MRN: [id number] Date of Birth: 07-Oct-1960 No Data Recorded  Encounter Date: 01/09/2016      PT End of Session - 01/09/16 0840    Visit Number 17   Number of Visits 25   Date for PT Re-Evaluation 01/11/16   PT Start Time 0802   PT Stop Time 0840   PT Time Calculation (min) 38 min   Activity Tolerance Patient tolerated treatment well;No increased pain   Behavior During Therapy WFL for tasks assessed/performed      Past Medical History:  Diagnosis Date  . Hypertension     History reviewed. No pertinent surgical history.  There were no vitals filed for this visit.      Subjective Assessment - 01/09/16 0807    Subjective Pt reports feeling "same as I always feel." She gets her MRI results in the morning.    Pertinent History work duties   Limitations Lifting;House hold activities   Patient Stated Goals to get arm working better so she can continue to work overtime hours   Currently in Pain? Yes   Pain Score --  moderate     Therex: Sidelying ER with towel roll under elbow with 3# DB x10; pt reported feeling tight in biceps region; PT attempted to modify exercise technique but pt continued to feel it in the biceps region and denied feeling it in the infraspinatus; PT positioned pt's shoulder posteriorly and provided manual resistance and pt reported decreased biceps pain  Prone rowing and prone I's with 2# DB x20, x10 each  Prone y's and t's with no weight 3x10 each; cues to turn thumb up during t's and pt reported "that feels much better"  Bil scap retraction with ER with GTB 3x10; cues for bil scap retraction and depression  Yellow body blade, vertical and horizontal, 5x20 sec holds each  Plantigrade plank position with table in lowest position  with weight shifting x 8, x4; pt had increased pain during exercise and stated "this is a 10/10 pain" so dc'd exercise  Bil rowing on OME[employer] machine, 15# x10 (too easy), 20# x10 (too easy), 25# x10      PT Education - 01/09/16 0840    Education provided Yes   Education Details exercise technique, call tomorrow after she gets MRI results    Person(s) Educated Patient   Methods Explanation   Comprehension Verbalized understanding             PT Long Term Goals - 01/09/16 0809      PT LONG TERM GOAL #1   Title pt will have decreased QuickDash score by >8 points to demonstrate decreased pain and improved overall function with work and household duties.   Baseline 59; 54.5 (12/14/15)   Time 4   Period Weeks   Status Partially Met     PT LONG TERM GOAL #2   Title pt will have improved R shoulder abd to 170 deg with <2/10 pain to demonstrate improved overall function and improve ability to perform work duties.   Baseline 75 deg, limited due to pain, 80 degrees pain limiteed (11/23/15); 87 degrees limited by pain at 3/10 (12/14/15); full AROM with 1/10 pain   Time 4   Period Weeks   Status Achieved     PT LONG TERM GOAL #3  Title pt will be independent with HEP to promote recovery and prevent reinjury.   Baseline i   Time 4   Period Weeks   Status Partially Met               Plan - 01/09/16 0842    Clinical Impression Statement SPT reassessed pt's goals this session. Pt has met 1 and partially met 1 goal. Pt has mild painful arc and slight pain at end range with shoulder abd, no pain with shoulder flexion, which is a significant improvement from initial evaluation where both were limited due to pain. Pt continues to have pain in biceps region throughout strengthening and requires postural correction, verbal and tactile cuing to decrease. Pt returns to her orthopedist tomorrow to receive her MRI results; pt will call tomorrow and let PT and SPT know treatment options going  forward. Pt needs continued skilled PT intervention to maximize overall function.   Rehab Potential Good   Clinical Impairments Affecting Rehab Potential amount of work per week (130-150 hours/week), however, pt is motivated and needs to improve to contiue working.   PT Frequency 1x / week   PT Duration 4 weeks   PT Treatment/Interventions ADLs/Self Care Home Management;Cryotherapy;Electrical Stimulation;Functional mobility training;Therapeutic activities;Therapeutic exercise;Neuromuscular re-education;Patient/family education;Manual techniques;Passive range of motion;Dry needling   PT Next Visit Plan continue addressing posture, strengthening   Consulted and Agree with Plan of Care Patient      Patient will benefit from skilled therapeutic intervention in order to improve the following deficits and impairments:  Decreased activity tolerance, Decreased range of motion, Decreased strength, Hypomobility, Increased muscle spasms, Impaired UE functional use, Improper body mechanics, Postural dysfunction, Pain  Visit Diagnosis: Acute pain of right shoulder  Abnormal posture     Problem List Patient Active Problem List   Diagnosis Date Noted  . HBP (high blood pressure) 01/17/2015  . Obesity 01/17/2015   Geraldine Solar, SPT  Geraldine Solar 01/09/2016, 8:47 AM  [facility] [address] [place], [address] Phone: [phone]   Fax:  [phone]  Name: Kelli Chapman MRN: [id number] Date of Birth: 1960/10/15

## 2016-01-14 ENCOUNTER — Ambulatory Visit: Payer: 59 | Admitting: Physical Therapy

## 2016-01-16 ENCOUNTER — Encounter: Payer: 59 | Admitting: Physical Therapy

## 2016-01-21 ENCOUNTER — Encounter: Payer: 59 | Admitting: Physical Therapy

## 2016-01-21 DIAGNOSIS — M7541 Impingement syndrome of right shoulder: Secondary | ICD-10-CM | POA: Diagnosis not present

## 2016-01-23 ENCOUNTER — Encounter: Payer: 59 | Admitting: Physical Therapy

## 2016-01-28 ENCOUNTER — Encounter: Payer: 59 | Admitting: Physical Therapy

## 2016-01-30 ENCOUNTER — Encounter: Payer: 59 | Admitting: Physical Therapy

## 2016-02-04 ENCOUNTER — Encounter: Payer: 59 | Admitting: Physical Therapy

## 2016-02-04 DIAGNOSIS — M75111 Incomplete rotator cuff tear or rupture of right shoulder, not specified as traumatic: Secondary | ICD-10-CM | POA: Diagnosis not present

## 2016-02-04 DIAGNOSIS — M7581 Other shoulder lesions, right shoulder: Secondary | ICD-10-CM | POA: Diagnosis not present

## 2016-02-06 ENCOUNTER — Encounter: Payer: 59 | Admitting: Physical Therapy

## 2016-03-10 NOTE — Patient Instructions (Signed)
  Your procedure is scheduled WU:JWJXBJYon:Tuesday Dec. 19 , 2017. Report to Same Day Surgery. To find out your arrival time please call [phone] between 1PM - 3PM on Monday Dec. 18, 2017.  Remember: Instructions that are not followed completely may result in serious medical risk, up to and including death, or upon the discretion of your surgeon and anesthesiologist your surgery may need to be rescheduled.    _x___ 1. Do not eat food or drink liquids after midnight. No gum chewing or hard candies.     ____ 2. No Alcohol for 24 hours before or after surgery.   ____ 3. Bring all medications with you on the day of surgery if instructed.    __x__ 4. Notify your doctor if there is any change in your medical condition     (cold, fever, infections).    _____ 5. No smoking 24 hours prior to surgery.     Do not wear jewelry, make-up, hairpins, clips or nail polish.  Do not wear lotions, powders, or perfumes.   Do not shave 48 hours prior to surgery. Men may shave face and neck.  Do not bring valuables to the hospital.    [facility] is not responsible for any belongings or valuables.               Contacts, dentures or bridgework may not be worn into surgery.  Leave your suitcase in the car. After surgery it may be brought to your room.  For patients admitted to the hospital, discharge time is determined by your treatment team.   Patients discharged the day of surgery will not be allowed to drive home.    Please read over the following fact sheets that you were given:   [facility]  _x___ Take these medicines the morning of surgery with A SIP OF WATER: NONE     ____ Fleet Enema (as directed)   _x___ Use CHG Soap as directed on instruction sheet  ____ Use inhalers on the day of surgery and bring to hospital day of surgery  ____ Stop metformin 2 days prior to surgery    ____ Take 1/2 of usual insulin dose the night before surgery and none on the morning of  surgery.    ____ Stop Coumadin/Plavix/aspirin on does not apply.  __x__ Stop Anti-inflammatories such as Advil, Aleve, Ibuprofen, meloxicam (MOBIC),  Motrin, Naproxen, Naprosyn, Goodies powders or aspirin products. OK to  take Tylenol or Tramadol. .   ____ Stop supplements until after surgery.    ____ Bring C-Pap to the hospital.

## 2016-03-12 ENCOUNTER — Encounter
Admission: RE | Admit: 2016-03-12 | Discharge: 2016-03-12 | Disposition: A | Discharge status: Home / Self Care | Payer: 59 | Source: Ambulatory Visit | Attending: Surgery | Admitting: Surgery

## 2016-03-12 DIAGNOSIS — Z01810 Encounter for preprocedural cardiovascular examination: Secondary | ICD-10-CM | POA: Insufficient documentation

## 2016-03-12 DIAGNOSIS — I10 Essential (primary) hypertension: Secondary | ICD-10-CM | POA: Insufficient documentation

## 2016-03-12 HISTORY — DX: Unspecified osteoarthritis, unspecified site: M19.90

## 2016-03-12 NOTE — Patient Instructions (Signed)
  Your procedure is scheduled on:Dec. 19 , 2017. Report to Same Day Surgery. To find out your arrival time please call [phone] between 1PM - 3PM on Monday Dec. 18, 2017.  Remember: Instructions that are not followed completely may result in serious medical risk, up to and including death, or upon the discretion of your surgeon and anesthesiologist your surgery may need to be rescheduled.    _x___ 1. Do not eat food or drink liquids after midnight. No gum chewing or hard candies.     ____ 2. No Alcohol for 24 hours before or after surgery.   ____ 3. Bring all medications with you on the day of surgery if instructed.    __x__ 4. Notify your doctor if there is any change in your medical condition     (cold, fever, infections).    _____ 5. No smoking 24 hours prior to surgery.     Do not wear jewelry, make-up, hairpins, clips or nail polish.  Do not wear lotions, powders, or perfumes.   Do not shave 48 hours prior to surgery. Men may shave face and neck.  Do not bring valuables to the hospital.    [facility] for any belongings or valuables.               Contacts, dentures or bridgework may not be worn into surgery.  Leave your suitcase in the car. After surgery it may be brought to your room.  For patients admitted to the hospital, discharge time is determined by your treatment team.   Patients discharged the day of surgery will not be allowed to drive home.    Please read over the following fact sheets that you were given:   [facility]  ____ Take these medicines the morning of surgery with A SIP OF WATER: NONE     ____ Fleet Enema (as directed)   _x___ Use CHG Soap as directed on instruction sheet  ____ Use inhalers on the day of surgery and bring to hospital day of surgery  ____ Stop metformin 2 days prior to surgery    ____ Take 1/2 of usual insulin dose the night before surgery and none on the morning of surgery.   ____  Stop Coumadin/Plavix/aspirin on does not apply.  _x___ Stop Anti-inflammatories such as Advil, Aleve, Ibuprofen,meloxicam (MOBIC) , Motrin, Naproxen, Naprosyn, Goodies powders or aspirin products. OK  to take Tylenol or Norco or Tramadol.   ____ Stop supplements until after surgery.    ____ Bring C-Pap to the hospital.

## 2016-03-18 ENCOUNTER — Ambulatory Visit: Payer: 59 | Admitting: Anesthesiology

## 2016-03-18 ENCOUNTER — Ambulatory Visit
Admission: RE | Admit: 2016-03-18 | Discharge: 2016-03-18 | Disposition: A | Discharge status: Home / Self Care | Payer: 59 | Source: Ambulatory Visit | Attending: Surgery | Admitting: Surgery

## 2016-03-18 ENCOUNTER — Encounter
Admission: RE | Disposition: A | Discharge status: Home / Self Care | Payer: Self-pay | Source: Ambulatory Visit | Attending: Surgery

## 2016-03-18 DIAGNOSIS — K219 Gastro-esophageal reflux disease without esophagitis: Secondary | ICD-10-CM | POA: Diagnosis not present

## 2016-03-18 DIAGNOSIS — M75111 Incomplete rotator cuff tear or rupture of right shoulder, not specified as traumatic: Secondary | ICD-10-CM | POA: Diagnosis not present

## 2016-03-18 DIAGNOSIS — M7591 Shoulder lesion, unspecified, right shoulder: Secondary | ICD-10-CM | POA: Diagnosis not present

## 2016-03-18 DIAGNOSIS — M7541 Impingement syndrome of right shoulder: Secondary | ICD-10-CM | POA: Diagnosis not present

## 2016-03-18 DIAGNOSIS — Z5333 Arthroscopic surgical procedure converted to open procedure: Secondary | ICD-10-CM | POA: Diagnosis not present

## 2016-03-18 DIAGNOSIS — M25511 Pain in right shoulder: Secondary | ICD-10-CM | POA: Diagnosis not present

## 2016-03-18 DIAGNOSIS — M24111 Other articular cartilage disorders, right shoulder: Secondary | ICD-10-CM | POA: Diagnosis not present

## 2016-03-18 DIAGNOSIS — M19011 Primary osteoarthritis, right shoulder: Secondary | ICD-10-CM | POA: Insufficient documentation

## 2016-03-18 DIAGNOSIS — I10 Essential (primary) hypertension: Secondary | ICD-10-CM | POA: Diagnosis not present

## 2016-03-18 DIAGNOSIS — M65811 Other synovitis and tenosynovitis, right shoulder: Secondary | ICD-10-CM | POA: Diagnosis not present

## 2016-03-18 DIAGNOSIS — G8918 Other acute postprocedural pain: Secondary | ICD-10-CM | POA: Diagnosis not present

## 2016-03-18 DIAGNOSIS — M7581 Other shoulder lesions, right shoulder: Secondary | ICD-10-CM | POA: Diagnosis not present

## 2016-03-18 HISTORY — PX: SHOULDER ARTHROSCOPY WITH OPEN ROTATOR CUFF REPAIR: SHX6092

## 2016-03-18 SURGERY — ARTHROSCOPY, SHOULDER WITH REPAIR, ROTATOR CUFF, OPEN
Anesthesia: General | Laterality: Right | Wound class: Clean

## 2016-03-18 MED ORDER — DEXAMETHASONE SODIUM PHOSPHATE 10 MG/ML IJ SOLN
INTRAMUSCULAR | Status: DC | PRN
  Administered 2016-03-18: 5 mg via INTRAVENOUS

## 2016-03-18 MED ORDER — FAMOTIDINE 20 MG PO TABS
20.0000 mg | ORAL_TABLET | Freq: Once | ORAL | Status: AC
  Administered 2016-03-18: 20 mg via ORAL

## 2016-03-18 MED ORDER — ONDANSETRON HCL 4 MG/2ML IJ SOLN
INTRAMUSCULAR | Status: DC | PRN
  Administered 2016-03-18: 4 mg via INTRAVENOUS

## 2016-03-18 MED ORDER — ROPIVACAINE HCL 5 MG/ML IJ SOLN
INTRAMUSCULAR | Status: DC | PRN
  Administered 2016-03-18 (×3): 10 mL via PERINEURAL

## 2016-03-18 MED ORDER — EPINEPHRINE PF 1 MG/ML IJ SOLN
INTRAMUSCULAR | Status: DC | PRN
  Administered 2016-03-18: 2 mL

## 2016-03-18 MED ORDER — KETOROLAC TROMETHAMINE 30 MG/ML IJ SOLN
INTRAMUSCULAR | Status: DC | PRN
  Administered 2016-03-18: 30 mg via INTRAVENOUS

## 2016-03-18 MED ORDER — FENTANYL CITRATE (PF) 100 MCG/2ML IJ SOLN
INTRAMUSCULAR | Status: DC | PRN
  Administered 2016-03-18 (×2): 50 ug via INTRAVENOUS

## 2016-03-18 MED ORDER — SUCCINYLCHOLINE CHLORIDE 20 MG/ML IJ SOLN
INTRAMUSCULAR | Status: DC | PRN
  Administered 2016-03-18: 100 mg via INTRAVENOUS

## 2016-03-18 MED ORDER — LACTATED RINGERS IV SOLN
INTRAVENOUS | Status: DC
Start: 2016-03-18 — End: 2016-03-18
  Administered 2016-03-18: 14:00:00 via INTRAVENOUS

## 2016-03-18 MED ORDER — OXYCODONE HCL 5 MG/5ML PO SOLN: 5.0000 mg | Freq: Once | ORAL | Status: DC | PRN

## 2016-03-18 MED ORDER — SODIUM CHLORIDE 0.9 % IV SOLN
INTRAVENOUS | Status: DC | PRN
  Administered 2016-03-18: 50 ug/min via INTRAVENOUS

## 2016-03-18 MED ORDER — MIDAZOLAM HCL 2 MG/2ML IJ SOLN
INTRAMUSCULAR | Status: AC
  Administered 2016-03-18: 1 mg
  Filled 2016-03-18: qty 2

## 2016-03-18 MED ORDER — EPINEPHRINE PF 1 MG/ML IJ SOLN
INTRAMUSCULAR | Status: AC
  Filled 2016-03-18: qty 3

## 2016-03-18 MED ORDER — PHENYLEPHRINE HCL 10 MG/ML IJ SOLN
INTRAMUSCULAR | Status: DC | PRN
Start: 2016-03-18 — End: 2016-03-18
  Administered 2016-03-18: 100 ug via INTRAVENOUS
  Administered 2016-03-18: 50 ug via INTRAVENOUS

## 2016-03-18 MED ORDER — BUPIVACAINE HCL (PF) 0.5 % IJ SOLN
INTRAMUSCULAR | Status: AC
  Filled 2016-03-18: qty 30

## 2016-03-18 MED ORDER — LIDOCAINE HCL (PF) 1 % IJ SOLN
INTRAMUSCULAR | Status: AC
  Filled 2016-03-18: qty 5

## 2016-03-18 MED ORDER — ROCURONIUM BROMIDE 100 MG/10ML IV SOLN
INTRAVENOUS | Status: DC | PRN
  Administered 2016-03-18: 5 mg via INTRAVENOUS
  Administered 2016-03-18: 25 mg via INTRAVENOUS

## 2016-03-18 MED ORDER — PROPOFOL 10 MG/ML IV BOLUS
INTRAVENOUS | Status: DC | PRN
  Administered 2016-03-18: 150 mg via INTRAVENOUS

## 2016-03-18 MED ORDER — BUPIVACAINE-EPINEPHRINE 0.5% -1:200000 IJ SOLN
INTRAMUSCULAR | Status: DC | PRN
Start: 2016-03-18 — End: 2016-03-18
  Administered 2016-03-18: 30 mL

## 2016-03-18 MED ORDER — OXYCODONE HCL 5 MG PO TABS: 5.0000 mg | ORAL_TABLET | ORAL | 0 refills | Status: DC | PRN

## 2016-03-18 MED ORDER — FENTANYL CITRATE (PF) 100 MCG/2ML IJ SOLN
INTRAMUSCULAR | Status: AC
  Administered 2016-03-18: 50 ug
  Filled 2016-03-18: qty 2

## 2016-03-18 MED ORDER — ROPIVACAINE HCL 5 MG/ML IJ SOLN
INTRAMUSCULAR | Status: AC
  Filled 2016-03-18: qty 40

## 2016-03-18 MED ORDER — IBUPROFEN 800 MG PO TABS: 800.0000 mg | ORAL_TABLET | Freq: Three times a day (TID) | ORAL | 2 refills | Status: DC

## 2016-03-18 MED ORDER — LIDOCAINE HCL (CARDIAC) 20 MG/ML IV SOLN
INTRAVENOUS | Status: DC | PRN
  Administered 2016-03-18: 60 mg via INTRAVENOUS

## 2016-03-18 MED ORDER — OXYCODONE HCL 5 MG PO TABS
5.0000 mg | ORAL_TABLET | Freq: Once | ORAL | Status: DC | PRN
Start: 2016-03-18 — End: 2016-03-18

## 2016-03-18 MED ORDER — FENTANYL CITRATE (PF) 100 MCG/2ML IJ SOLN: 25.0000 ug | INTRAMUSCULAR | Status: DC | PRN

## 2016-03-18 MED ORDER — CEFAZOLIN SODIUM-DEXTROSE 2-4 GM/100ML-% IV SOLN
2.0000 g | Freq: Once | INTRAVENOUS | Status: AC
  Administered 2016-03-18: 2 g via INTRAVENOUS

## 2016-03-18 MED ORDER — MIDAZOLAM HCL 5 MG/5ML IJ SOLN
INTRAMUSCULAR | Status: DC | PRN
  Administered 2016-03-18 (×2): 1 mg via INTRAVENOUS

## 2016-03-18 MED ORDER — LIDOCAINE HCL (PF) 1 % IJ SOLN
INTRAMUSCULAR | Status: DC | PRN
  Administered 2016-03-18: 1 mL via INTRADERMAL

## 2016-03-18 MED ORDER — SUGAMMADEX SODIUM 200 MG/2ML IV SOLN
INTRAVENOUS | Status: DC | PRN
  Administered 2016-03-18: 210 mg via INTRAVENOUS

## 2016-03-18 SURGICAL SUPPLY — 45 items
ANCHOR JUGGERKNOT WTAP NDL 2.9 (Anchor) ×4 IMPLANT
ANCHOR SUT QUATTRO KNTLS 4.5 (Anchor) ×2 IMPLANT
BIT DRILL JUGRKNT W/NDL BIT2.9 (DRILL) ×1 IMPLANT
BLADE FULL RADIUS 3.5 (BLADE) ×2 IMPLANT
BLADE SHAVER 4.5X7 STR FR (MISCELLANEOUS) ×2 IMPLANT
BUR ACROMIONIZER 4.0 (BURR) ×2 IMPLANT
BUR BR 5.5 WIDE MOUTH (BURR) IMPLANT
CANNULA SHAVER 8MMX76MM (CANNULA) ×2 IMPLANT
CHLORAPREP W/TINT 26ML (MISCELLANEOUS) ×4 IMPLANT
COVER MAYO STAND STRL (DRAPES) ×2 IMPLANT
DRAPE IMP U-DRAPE 54X76 (DRAPES) ×4 IMPLANT
DRILL JUGGERKNOT W/NDL BIT 2.9 (DRILL) ×2
DRSG OPSITE POSTOP 4X8 (GAUZE/BANDAGES/DRESSINGS) ×2 IMPLANT
ELECT REM PT RETURN 9FT ADLT (ELECTROSURGICAL) ×2
ELECTRODE REM PT RTRN 9FT ADLT (ELECTROSURGICAL) ×1 IMPLANT
GAUZE PETRO XEROFOAM 1X8 (MISCELLANEOUS) ×2 IMPLANT
GAUZE SPONGE 4X4 12PLY STRL (GAUZE/BANDAGES/DRESSINGS) ×2 IMPLANT
GLOVE BIO SURGEON STRL SZ7.5 (GLOVE) ×4 IMPLANT
GLOVE BIO SURGEON STRL SZ8 (GLOVE) ×4 IMPLANT
GLOVE BIOGEL PI IND STRL 8 (GLOVE) ×1 IMPLANT
GLOVE BIOGEL PI INDICATOR 8 (GLOVE) ×1
GLOVE INDICATOR 8.0 STRL GRN (GLOVE) ×2 IMPLANT
GOWN STRL REUS W/ TWL LRG LVL3 (GOWN DISPOSABLE) ×2 IMPLANT
GOWN STRL REUS W/ TWL XL LVL3 (GOWN DISPOSABLE) ×1 IMPLANT
GOWN STRL REUS W/TWL LRG LVL3 (GOWN DISPOSABLE) ×2
GOWN STRL REUS W/TWL XL LVL3 (GOWN DISPOSABLE) ×1
GRASPER SUT 15 45D LOW PRO (SUTURE) ×4 IMPLANT
IV LACTATED RINGER IRRG 3000ML (IV SOLUTION) ×2
IV LR IRRIG 3000ML ARTHROMATIC (IV SOLUTION) ×2 IMPLANT
MANIFOLD NEPTUNE II (INSTRUMENTS) ×2 IMPLANT
MASK FACE SPIDER DISP (MASK) ×2 IMPLANT
MAT BLUE FLOOR 46X72 FLO (MISCELLANEOUS) ×2 IMPLANT
NEEDLE REVERSE CUT 1/2 CRC (NEEDLE) IMPLANT
PACK ARTHROSCOPY SHOULDER (MISCELLANEOUS) ×2 IMPLANT
SLING ARM LRG DEEP (SOFTGOODS) ×2 IMPLANT
SLING ULTRA II LG (MISCELLANEOUS) ×2 IMPLANT
STAPLER SKIN PROX 35W (STAPLE) ×2 IMPLANT
STRAP SAFETY BODY (MISCELLANEOUS) ×2 IMPLANT
SUT ETHIBOND 0 MO6 C/R (SUTURE) ×4 IMPLANT
SUT VIC AB 2-0 CT1 27 (SUTURE) ×2
SUT VIC AB 2-0 CT1 TAPERPNT 27 (SUTURE) ×2 IMPLANT
TAPE MICROFOAM 4IN (TAPE) ×2 IMPLANT
TUBING ARTHRO INFLOW-ONLY STRL (TUBING) ×2 IMPLANT
TUBING CONNECTING 10 (TUBING) ×2 IMPLANT
WAND HAND CNTRL MULTIVAC 90 (MISCELLANEOUS) ×2 IMPLANT

## 2016-03-18 NOTE — Anesthesia Preprocedure Evaluation (Signed)
Anesthesia Evaluation  Patient identified by MRN, date of birth, ID band Patient awake    Reviewed: Allergy & Precautions, H&P , NPO status , Patient's Chart, lab work & pertinent test results  History of Anesthesia Complications Negative for: history of anesthetic complications  Airway Mallampati: III  TM Distance: >3 FB Neck ROM: full    Dental no notable dental hx. (+) Poor Dentition, Chipped, Missing   Pulmonary neg pulmonary ROS, neg shortness of breath,    Pulmonary exam normal breath sounds clear to auscultation       Cardiovascular Exercise Tolerance: Good hypertension, (-) angina(-) Past MI and (-) DOE Normal cardiovascular exam Rhythm:regular Rate:Normal     Neuro/Psych negative neurological ROS  negative psych ROS   GI/Hepatic negative GI ROS, Neg liver ROS, GERD  ,  Endo/Other  negative endocrine ROS  Renal/GU      Musculoskeletal  (+) Arthritis ,   Abdominal   Peds  Hematology negative hematology ROS (+)   Anesthesia Other Findings Signs and symptoms suggestive of sleep apnea   Past Medical History: No date: Arthritis     Comment: right shoulder No date: Hypertension  Past Surgical History: No date: NO PAST SURGERIES  BMI    Body Mass Index:  40.34 kg/m      Reproductive/Obstetrics negative OB ROS                             Anesthesia Physical Anesthesia Plan  ASA: III  Anesthesia Plan: General ETT   Post-op Pain Management:  Regional for Post-op pain   Induction:   Airway Management Planned:   Additional Equipment:   Intra-op Plan:   Post-operative Plan:   Informed Consent: I have reviewed the patients History and Physical, chart, labs and discussed the procedure including the risks, benefits and alternatives for the proposed anesthesia with the patient or authorized representative who has indicated his/her understanding and acceptance.   Dental  Advisory Given  Plan Discussed with: Anesthesiologist, CRNA and Surgeon  Anesthesia Plan Comments:         Anesthesia Quick Evaluation

## 2016-03-18 NOTE — Discharge Instructions (Addendum)
Keep dressing dry and intact.  °May shower after dressing changed on post-op day #4 (Saturday).  °Cover staples with Band-Aids after drying off. °Apply ice frequently to shoulder. °Take ibuprofen 800 mg TID with meals for 7-10 days, then as necessary. °Take oxycodone as prescribed when needed.  °May supplement with ES Tylenol if necessary. °Keep shoulder immobilizer on at all times except may remove for bathing purposes. °Follow-up in 10-14 days or as scheduled. ° ° ° °AMBULATORY SURGERY  °DISCHARGE INSTRUCTIONS ° ° °1) The drugs that you were given will stay in your system until tomorrow so for the next 24 hours you should not: ° °A) Drive an automobile °B) Make any legal decisions °C) Drink any alcoholic beverage ° ° °2) You may resume regular meals tomorrow.  Today it is better to start with liquids and gradually work up to solid foods. ° °You may eat anything you prefer, but it is better to start with liquids, then soup and crackers, and gradually work up to solid foods. ° ° °3) Please notify your doctor immediately if you have any unusual bleeding, trouble breathing, redness and pain at the surgery site, drainage, fever, or pain not relieved by medication. ° ° ° °4) Additional Instructions: ° ° ° ° ° ° ° °Please contact your physician with any problems or Same Day Surgery at [phone], Monday through Friday 6 am to 4 pm, or [place] [facility] [place] Main number at [phone]. °

## 2016-03-18 NOTE — Anesthesia Procedure Notes (Signed)
Procedure Name: Intubation Date/Time: 03/18/2016 3:29 PM Performed by: Lily KocherPERALTA, Whitni Pasquini Pre-anesthesia Checklist: Patient identified, Patient being monitored, Timeout performed, Emergency Drugs available and Suction available Patient Re-evaluated:Patient Re-evaluated prior to inductionOxygen Delivery Method: Circle system utilized Preoxygenation: Pre-oxygenation with 100% oxygen Intubation Type: IV induction Ventilation: Mask ventilation without difficulty Laryngoscope Size: Mac and 3 Grade View: Grade II Tube type: Oral Tube size: 7.0 mm Number of attempts: 1 Airway Equipment and Method: Stylet Placement Confirmation: ETT inserted through vocal cords under direct vision,  positive ETCO2 and breath sounds checked- equal and bilateral Secured at: 21 cm Tube secured with: Tape Dental Injury: Teeth and Oropharynx as per pre-operative assessment

## 2016-03-18 NOTE — Op Note (Signed)
03/18/2016  4:34 PM  Patient:   Kelli Chapman  Pre-Op Diagnosis:   Impingement/tendinopathy with near full-thickness rotator cuff tear, right shoulder.  Postoperative diagnosis: Impingement/tendinopathy with near full-thickness rotator cuff tear and labral fraying, right shoulder.  Procedure: Limited arthroscopic debridement, arthroscopic subacromial decompression, and mini-open rotator cuff repair, right shoulder.  Anesthesia: General endotracheal with interscalene block placed preoperatively by the anesthesiologist.  Surgeon:   Maryagnes AmosJ. Jeffrey Desmin Daleo, MD  Assistant:   Horris LatinoLance McGhee, PA-C  Findings: As above. There was fraying of the labrum anteriorly and superiorly without detachment from the glenoid. The biceps tendon was in excellent condition, as were the articular surfaces of the glenoid and humerus. There was a near full-thickness bursal surface tear of the anterior and mid-insertional fibers of the supraspinatus measuring approximately 1 x 1.5 cm.  Complications: None  Fluids:   900 cc  Estimated blood loss: 5 cc  Tourniquet time: None  Drains: None  Closure: Staples   Brief clinical note: The patient is a 55 year old female with a history of right shoulder pain. The patient's symptoms have progressed despite medications, activity modification, etc. The patient's history and examination are consistent with impingement/tendinopathy with a rotator cuff tear. These findings were confirmed by MRI scan. The patient presents at this time for definitive management of these shoulder symptoms.  Procedure: The patient underwent placement of an interscalene block by the anesthesiologist in the preoperative holding area before she was brought into the operating room and lain in the supine position. The patient then underwent general endotracheal intubation and anesthesia before being repositioned in the beach chair position using the beach chair positioner. The right  shoulder and upper extremity were prepped with ChloraPrep solution before being draped sterilely. Preoperative antibiotics were administered. A timeout was performed to confirm the proper surgical site before the expected portal sites and incision site were injected with 0.5% Sensorcaine with epinephrine. A posterior portal was created and the glenohumeral joint thoroughly inspected with the findings as described above. An anterior portal was created using an outside-in technique. The labrum and rotator cuff were further probed, again confirming the above-noted findings. The areas of labral fraying were debrided using the full-radius resector, as were several areas of synovitis. The ArthroCare wand was inserted and used to obtain hemostasis as well as to "anneal" the labrum superiorly and anteriorly. The instruments were removed from the joint after suctioning the excess fluid.  The camera was repositioned through the posterior portal into the subacromial space. A separate lateral portal was created using an outside-in technique. The 3.5 mm full-radius resector was introduced and used to perform a subtotal bursectomy. The ArthroCare wand was then inserted and used to remove the periosteal tissue off the undersurface of the anterior third of the acromion as well as to recess the coracoacromial ligament from its attachment along the anterior and lateral margins of the acromion. The 4.0 mm acromionizing bur was introduced and used to complete the decompression by removing the undersurface of the anterior third of the acromion. The full radius resector was reintroduced to remove any residual bony debris before the ArthroCare wand was reintroduced to obtain hemostasis. The instruments were then removed from the subacromial space after suctioning the excess fluid.  An approximately 4-5 cm incision was made over the anterolateral aspect of the shoulder beginning at the anterolateral corner of the acromion and extending  distally in line with the bicipital groove. This incision was carried down through the subcutaneous tissues to expose the deltoid fascia.  The raphae between the anterior and middle thirds was identified and this plane developed to provide access into the subacromial space. Additional bursal tissues were debrided sharply using Metzenbaum scissors. The rotator cuff tear was readily identified. The margins were debrided sharply with a #15 blade and the exposed greater tuberosity roughened with a rongeur. The tear was repaired using two Biomet 2.9 mm JuggerKnot anchors. Several of these sutures were then brought back laterally and secured using a [employer] Quatro[employer] anchor to create a two-layer closure. An apparent watertight closure was obtained.  The wound was copiously irrigated with sterile saline solution before the deltoid raphae was reapproximated using 2-0 Vicryl interrupted sutures. The subcutaneous tissues were closed in two layers using 2-0 Vicryl interrupted sutures before the skin was closed using staples. The portal sites also were closed using staples. A sterile bulky dressing was applied to the shoulder before the arm was placed into a shoulder immobilizer. The patient was then awakened, extubated, and returned to the recovery room in satisfactory condition after tolerating the procedure well.

## 2016-03-18 NOTE — Transfer of Care (Signed)
Immediate Anesthesia Transfer of Care Note  Patient: Kelli Chapman  Procedure(s) Performed: Procedure(s): SHOULDER ARTHROSCOPY WITH OPEN ROTATOR CUFF REPAIR Label debridement, Decompression (Right)  Patient Location: PACU  Anesthesia Type:General  Level of Consciousness: sedated  Airway & Oxygen Therapy: Patient Spontanous Breathing and Patient connected to face mask oxygen  Post-op Assessment: Report given to RN and Post -op Vital signs reviewed and stable  Post vital signs: Reviewed and stable  Last Vitals:  Vitals:   03/18/16 1500 03/18/16 1657  BP: 131/86 137/84  Pulse: 75 89  Resp: (!) 22 20  Temp:  36.4 C    Last Pain:  Vitals:   03/18/16 1347  TempSrc: Tympanic         Complications: No apparent anesthesia complications

## 2016-03-18 NOTE — H&P (Signed)
Paper H&P to be scanned into permanent record. H&P reviewed. No changes. 

## 2016-03-18 NOTE — Anesthesia Procedure Notes (Signed)
Anesthesia Regional Block:  Interscalene brachial plexus block  Pre-Anesthetic Checklist: ,, timeout performed, Correct Patient, Correct Site, Correct Laterality, Correct Procedure, Correct Position, site marked, Risks and benefits discussed,  Surgical consent,  Pre-op evaluation,  At surgeon's request and post-op pain management  Laterality: Upper and Right  Prep: chloraprep       Needles:  Injection technique: Single-shot  Needle Type: Stimiplex     Needle Length: 5cm 5 cm Needle Gauge: 22 and 22 G    Additional Needles:  Procedures: ultrasound guided (picture in chart) Interscalene brachial plexus block Narrative:  Start time: 03/18/2016 2:38 PM End time: 03/18/2016 2:40 PM Injection made incrementally with aspirations every 5 mL.  Performed by: Personally  Anesthesiologist: Margorie JohnPISCITELLO, Keilen Kahl K  Additional Notes: Functioning IV was confirmed and monitors were applied.  A 50mm 22ga Stimuplex needle was used. Sterile prep,hand hygiene and sterile gloves were used.  Negative aspiration and negative test dose prior to incremental administration of local anesthetic. The patient tolerated the procedure well with no immediate complications.

## 2016-03-19 ENCOUNTER — Encounter: Payer: Self-pay | Admitting: Surgery

## 2016-03-19 NOTE — Anesthesia Postprocedure Evaluation (Signed)
Anesthesia Post Note  Patient: Kelli Chapman  Procedure(s) Performed: Procedure(s) (LRB): SHOULDER ARTHROSCOPY WITH OPEN ROTATOR CUFF REPAIR Label debridement, Decompression (Right)  Patient location during evaluation: PACU Anesthesia Type: General Level of consciousness: awake and alert Pain management: pain level controlled Vital Signs Assessment: post-procedure vital signs reviewed and stable Respiratory status: spontaneous breathing, nonlabored ventilation, respiratory function stable and patient connected to nasal cannula oxygen Cardiovascular status: blood pressure returned to baseline and stable Postop Assessment: no signs of nausea or vomiting Anesthetic complications: no     Last Vitals:  Vitals:   03/18/16 1750 03/18/16 1812  BP: 131/77 135/72  Pulse: 74 67  Resp: 16 16  Temp: 36.4 C     Last Pain:  Vitals:   03/18/16 1812  TempSrc:   PainSc: 0-No pain                 Evarose Altland S

## 2016-03-20 DIAGNOSIS — M24111 Other articular cartilage disorders, right shoulder: Secondary | ICD-10-CM | POA: Insufficient documentation

## 2016-04-14 ENCOUNTER — Ambulatory Visit: Payer: 59 | Attending: Orthopedic Surgery | Admitting: Physical Therapy

## 2016-04-14 ENCOUNTER — Encounter: Payer: Self-pay | Admitting: Physical Therapy

## 2016-04-14 VITALS — BP 142/98

## 2016-04-14 DIAGNOSIS — M25511 Pain in right shoulder: Secondary | ICD-10-CM | POA: Diagnosis not present

## 2016-04-14 DIAGNOSIS — M25621 Stiffness of right elbow, not elsewhere classified: Secondary | ICD-10-CM | POA: Insufficient documentation

## 2016-04-14 DIAGNOSIS — M25611 Stiffness of right shoulder, not elsewhere classified: Secondary | ICD-10-CM | POA: Insufficient documentation

## 2016-04-14 DIAGNOSIS — M6281 Muscle weakness (generalized): Secondary | ICD-10-CM | POA: Diagnosis not present

## 2016-04-14 NOTE — Therapy (Signed)
[place] [facility] [address] [place], [address] Phone: [phone]   Fax:  [phone]  Physical Therapy Evaluation  Patient Details  Name: MARSHELLE BILGER MRN: [id number] Date of Birth: 12/28/1960 Referring Provider: Altamese Cabal  Encounter Date: 04/14/2016      PT End of Session - 04/14/16 1041    Visit Number 1   Number of Visits 17   Date for PT Re-Evaluation 06/09/16   PT Start Time 0825   PT Stop Time 0915   PT Time Calculation (min) 50 min   Behavior During Therapy [facility] for tasks assessed/performed      Past Medical History:  Diagnosis Date  . Arthritis    right shoulder  . Hypertension     Past Surgical History:  Procedure Laterality Date  . NO PAST SURGERIES    . SHOULDER ARTHROSCOPY WITH OPEN ROTATOR CUFF REPAIR Right 03/18/2016   Procedure: SHOULDER ARTHROSCOPY WITH OPEN ROTATOR CUFF REPAIR Label debridement, Decompression;  Surgeon: Christena Flake, MD;  Location: [facility] ORS;  Service: Orthopedics;  Laterality: Right;    Vitals:   04/14/16 0920  BP: (!) 142/98         Subjective Assessment - 04/14/16 0921    Subjective Pt had R RCR 03/18/16. She has had no PT since that time and presents for first session. Reports she has been told by  her PA to avoid "using" her arm, but she is not fully clear what this means. Pt states that her shoulder is currently feeling good however she states that she experiences significant night pain. She states that she is sleeping in her bed on her R side and is unable to get comfortable which leads to restlessness and her pain. She states that she has done some activities with her R shoulder performing ADLs and fears that she has "overdone it." Pt expresses a feeling of sadness and isolation with not working or being able to leave her house.   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    Limitations Lifting;Writing;House hold activities   Patient Stated Goals sleep in her bed comfortably without pain or restlessness, return to work   Currently in Pain? No/denies   Pain Score 0-No pain   Pain Location Shoulder   Pain Orientation Right   Pain Descriptors / Indicators Aching;Burning   Pain Type Surgical pain   Pain Onset Other (comment)  03/18/16 surgery date   Pain Frequency Occasional   Aggravating Factors  lying flat on right side   Pain Relieving Factors medication   Effect of Pain on Daily Activities Unable to perform ADLs            [facility] PT Assessment - 04/14/16 0001      Assessment   Medical Diagnosis Status post open RCR   Referring Provider Altamese Cabal   Onset Date/Surgical Date 03/18/16   Hand Dominance Right   Next MD Visit 04/28/16   Prior Therapy Prior to surgery, none since     Precautions   Precautions Shoulder   Type of Shoulder Precautions Open RCR protocol   Precaution Comments Reviewed with pt     Restrictions   Weight Bearing Restrictions Yes   Other Position/Activity Restrictions Open RCR protocol     Balance Screen   Has the patient fallen in the past 6 months No   Has the patient had a decrease  in activity level because of a fear of falling?  Yes   Is the patient reluctant to leave their home because of a fear of falling?  No     Home Environment   Living Environment Private residence   Living Arrangements Children   Available Help at Discharge Family   Type of Home Apartment   Home Access Stairs to enter   Entrance Stairs-Number of Steps 5   Entrance Stairs-Rails Right;Left   Home Layout One level     Prior Function   Level of Independence Independent   Vocation Full time employment   Horticulturist, commercial; lifting, reaching     Posture/Postural Control   Posture Comments in seated and standing elevated R shoulder, able to correct with cuing.     ROM / Strength   AROM / PROM / Strength  PROM;Strength;AROM     AROM   Overall AROM Comments Neck, hand and wrist, elbow WNL B except R elbow ext -20     PROM   Overall PROM Comments R Shld flexion: 110, ER: 70, scaption: 120, IR: 42     Strength   Overall Strength Comments Unable to assess due to restrictive protocol however obvious weakness.     Palpation   Palpation comment Pt denies any tenderness in upper traps and other cervical musculature      Objective: Positioned in supine PROM to R shld for 10 minutes; flexion, scaption, ER, and IR 5x30" each direction. Pt states she did feel any pain with PROM and stated that it felt "pretty good" she also states that she felt a "good" stretch and that it felt good to have her shoulder move.  Extensive self care and home management education was given to the pt. Post op protocol was reviewed with pt with emphasis given on not actively using shoulder musculature until cleared by surgeon. Pt was also educated on not sleeping on her R side however pt stated that she was unable to sleep any other way and was not receptive to education. Pt verbalized understanding of education.                     PT Education - 04/14/16 1039    Education provided Yes   Education Details Post operative protocol reviewed with patient, educated on not actively "overusing" shoulder   Person(s) Educated Patient   Methods Explanation;Demonstration   Comprehension Verbalized understanding             PT Long Term Goals - 04/14/16 1100      PT LONG TERM GOAL #1   Title Pts QuickDash score will decrease to 50 so she can perform ADLs with less difficulty   Baseline 75   Time 8   Period Weeks   Status New     PT LONG TERM GOAL #2   Title Pts ROM will be full in all directions to improve ability to perform ADLs   Baseline PROM flexion 110, scaption 120, ER 70, IR 42   Time 8   Period Weeks   Status New     PT LONG TERM GOAL #3   Title pt will be independent with HEP to  prevent reinjury and accelerate healing process   Baseline not independent with HEP   Time 8   Period Weeks   Status New               Plan - 04/14/16 1042    Clinical Impression Statement Pt  is a pleasant 56 year old who presents to clinic S/P open RCR 03/18/16. She expresses concern that she may have "over done it" over the past 4 weeks because she is getting severe 10/10 pain at night associated with lying flat in bed and on right side, PT strongly encouraged pt to avoid laying on R side. Pt displays gross loss of ROM and strength associated with open RCR. Pt lacks 20 degrees of active elbow extension and states she feels a bruning sensation in her anterior forearm especially during elbow extension. Pts PROM is 110 degrees flexion, 120 degrees scaption, 70 degrees ER, and 42 degrees IR. Pts QuickDash is 75 disability/symptom score and 100 on work module. Pt is eager to retrun to work and her normal ADLs but expresses fear or regressing in the healing process.   Rehab Potential Good   Clinical Impairments Affecting Rehab Potential Positive: motivated, family support           Negative: medical comorbidities    PT Frequency 2x / week   PT Duration 8 weeks   PT Treatment/Interventions ADLs/Self Care Home Management;Functional mobility training;Therapeutic activities;Therapeutic exercise;Neuromuscular re-education;Patient/family education;Manual techniques;Passive range of motion;Dry needling;Scar mobilization   PT Next Visit Plan STM, PROM   Consulted and Agree with Plan of Care Patient      Patient will benefit from skilled therapeutic intervention in order to improve the following deficits and impairments:  Decreased activity tolerance, Decreased range of motion, Decreased strength, Hypomobility, Increased muscle spasms, Impaired UE functional use, Pain  Visit Diagnosis: Acute pain of right shoulder - Plan: PT plan of care cert/re-cert  Muscle weakness (generalized) - Plan: PT plan  of care cert/re-cert  Stiffness of right shoulder, not elsewhere classified - Plan: PT plan of care cert/re-cert  Stiffness of right elbow, not elsewhere classified - Plan: PT plan of care cert/re-cert     Problem List Patient Active Problem List   Diagnosis Date Noted  . HBP (high blood pressure) 01/17/2015  . Obesity 01/17/2015    Fisher,Benjamin PT DPT 04/14/2016, 11:26 AM  [place] [facility] [address] [place], [name], [address] Phone: [phone]   Fax:  [phone]  Name: Shane CrutchMary C Whitcomb MRN: [id number] Date of Birth: 11/26/1960

## 2016-04-17 ENCOUNTER — Ambulatory Visit: Payer: 59 | Admitting: Physical Therapy

## 2016-04-21 ENCOUNTER — Ambulatory Visit: Payer: 59 | Admitting: Physical Therapy

## 2016-04-21 ENCOUNTER — Encounter: Payer: Self-pay | Admitting: Physical Therapy

## 2016-04-21 DIAGNOSIS — M25511 Pain in right shoulder: Secondary | ICD-10-CM | POA: Diagnosis not present

## 2016-04-21 DIAGNOSIS — M25621 Stiffness of right elbow, not elsewhere classified: Secondary | ICD-10-CM

## 2016-04-21 DIAGNOSIS — M6281 Muscle weakness (generalized): Secondary | ICD-10-CM

## 2016-04-21 DIAGNOSIS — M25611 Stiffness of right shoulder, not elsewhere classified: Secondary | ICD-10-CM

## 2016-04-21 NOTE — Therapy (Signed)
[place] [facility] [address] [place], [name], [address] Phone: [phone]   Fax:  [phone]  Physical Therapy Treatment  Patient Details  Name: Kelli Chapman MRN: [id number] Date of Birth: Apr 20, 1960 Referring Provider: Altamese CabalMaurice Jones  Encounter Date: 04/21/2016      PT End of Session - 04/21/16 1009    Visit Number 2   Number of Visits 17   Date for PT Re-Evaluation 06/09/16   PT Start Time 0915   PT Stop Time 0959   PT Time Calculation (min) 44 min   Activity Tolerance No increased pain   Behavior During Therapy [facility] for tasks assessed/performed      Past Medical History:  Diagnosis Date  . Arthritis    right shoulder  . Hypertension     Past Surgical History:  Procedure Laterality Date  . NO PAST SURGERIES    . SHOULDER ARTHROSCOPY WITH OPEN ROTATOR CUFF REPAIR Right 03/18/2016   Procedure: SHOULDER ARTHROSCOPY WITH OPEN ROTATOR CUFF REPAIR Label debridement, Decompression;  Surgeon: Christena FlakeJohn J Poggi, MD;  Location: [facility] ORS;  Service: Orthopedics;  Laterality: Right;    There were no vitals filed for this visit.      Subjective Assessment - 04/21/16 1003    Subjective Pt. presents to the clinic stating that she is feeling "okay" but is fearful that she may have "done something" because she felt something "weird" while giving her self a massage to her anterior shoulder. She states that she has been compliant with instructions not to actively use her shoulder however states that she is still sleeping in bed and often on her right side.   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   Limitations Lifting;Writing;House hold activities   Patient Stated Goals sleep in her bed comfortably without pain or restlessness, return to work   Currently in Pain? No/denies   Pain Onset Other (comment)  03/18/16 surgery date      Objective: Positioned in supine PROM all directions 3X30 seconds to resistance of a "good stretch" but pain free.   Supine 3 sets of grade 1-2 posterior and inferior joint mobs for proprioceptive and decrease shoulder discomfort. Pt stated mobs felt "good" and it felt good to have her shoulder moved.  Pt educated on scapular retractions with verbal cuing and tactile cuing to not shrug shoulders but to depress and retract shoulders. Performed 3x5 with 5 second holds and was instructed to perform these at home.  Codman exercises were observed and verbal cuing was given to use body momentum to swing arm and not actively use shoulder musculature.  Pt performed active assistive ROM using UE ranger 3x30 seconds forward and back as well as side to side with verbal and tactile cuing to not shrug shoulders. She stated this felt like a good stretch and was a good exercise                           PT Education - 04/21/16 1007    Education provided Yes   Education Details Pt educated on the negatives of overusing shoulder and sleeping in bed on her right side.   Person(s) Educated Patient   Methods Explanation   Comprehension Verbalized understanding             PT Long Term Goals - 04/14/16 1100  PT LONG TERM GOAL #1   Title Pts QuickDash score will decrease to 50 so she can perform ADLs with less difficulty   Baseline 75   Time 8   Period Weeks   Status New     PT LONG TERM GOAL #2   Title Pts ROM will be full in all directions to improve ability to perform ADLs   Baseline PROM flexion 110, scaption 120, ER 70, IR 42   Time 8   Period Weeks   Status New     PT LONG TERM GOAL #3   Title pt will be independent with HEP to prevent reinjury and accelerate healing process   Baseline not independent with HEP   Time 8   Period Weeks   Status New               Plan - 04/21/16 1010    Clinical Impression Statement Pts. strength and ROM continue  to be decreased as to be expected post open RCR. Limited AROM elbow extension, full PROM elbow extension. Noted trigger points in extensor mass of forearm and burning sensation when in elbow extension. Continue post operative protocol PROM and address biceps tighteness and forearm trigger points.   Rehab Potential Good   Clinical Impairments Affecting Rehab Potential Positive: motivated, family support           Negative: medical comorbidities    PT Frequency 2x / week   PT Duration 8 weeks   PT Treatment/Interventions ADLs/Self Care Home Management;Functional mobility training;Therapeutic activities;Therapeutic exercise;Neuromuscular re-education;Patient/family education;Manual techniques;Passive range of motion;Dry needling;Scar mobilization   PT Next Visit Plan STM, PROM   Consulted and Agree with Plan of Care Patient      Patient will benefit from skilled therapeutic intervention in order to improve the following deficits and impairments:  Decreased activity tolerance, Decreased range of motion, Decreased strength, Hypomobility, Increased muscle spasms, Impaired UE functional use, Pain, Decreased balance  Visit Diagnosis: Acute pain of right shoulder  Muscle weakness (generalized)  Stiffness of right shoulder, not elsewhere classified  Stiffness of right elbow, not elsewhere classified     Problem List Patient Active Problem List   Diagnosis Date Noted  . HBP (high blood pressure) 01/17/2015  . Obesity 01/17/2015    [name] PT DPT 04/21/2016, 10:22 AM   [name] [address], [place], [address] Phone: [phone]   Fax:  [phone]  Name: Kelli Chapman MRN: [id number] Date of Birth: 1960/06/02

## 2016-04-23 ENCOUNTER — Encounter: Payer: Self-pay | Admitting: Physical Therapy

## 2016-04-23 ENCOUNTER — Ambulatory Visit: Payer: 59 | Admitting: Physical Therapy

## 2016-04-23 DIAGNOSIS — M25511 Pain in right shoulder: Secondary | ICD-10-CM | POA: Diagnosis not present

## 2016-04-23 DIAGNOSIS — M6281 Muscle weakness (generalized): Secondary | ICD-10-CM | POA: Diagnosis not present

## 2016-04-23 DIAGNOSIS — M25621 Stiffness of right elbow, not elsewhere classified: Secondary | ICD-10-CM | POA: Diagnosis not present

## 2016-04-23 DIAGNOSIS — M25611 Stiffness of right shoulder, not elsewhere classified: Secondary | ICD-10-CM | POA: Diagnosis not present

## 2016-04-23 NOTE — Therapy (Signed)
[place] [facility] [address] [place], [address] Phone: [phone]   Fax:  [phone]  Physical Therapy Treatment  Patient Details  Name: THRESIA RAMANATHAN MRN: [id number] Date of Birth: 01/17/1961 Referring Provider: Altamese Cabal  Encounter Date: 04/23/2016      PT End of Session - 04/23/16 0825    Visit Number 3   Number of Visits 17   Date for PT Re-Evaluation 06/09/16   PT Start Time 0742   PT Stop Time 0823   PT Time Calculation (min) 41 min   Activity Tolerance No increased pain   Behavior During Therapy [facility] for tasks assessed/performed      Past Medical History:  Diagnosis Date  . Arthritis    right shoulder  . Hypertension     Past Surgical History:  Procedure Laterality Date  . NO PAST SURGERIES    . SHOULDER ARTHROSCOPY WITH OPEN ROTATOR CUFF REPAIR Right 03/18/2016   Procedure: SHOULDER ARTHROSCOPY WITH OPEN ROTATOR CUFF REPAIR Label debridement, Decompression;  Surgeon: Christena Flake, MD;  Location: [facility] ORS;  Service: Orthopedics;  Laterality: Right;    There were no vitals filed for this visit.      Subjective Assessment - 04/23/16 0829    Subjective Pt states that her shoulder is doing well and she has completely quit taking pain medication. She states that she occassionally has pain at the end of the day which she attributes to overdoing it throughout day and not being able to get comfortable in bed.    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   Limitations Lifting;Writing;House hold activities   Patient Stated Goals sleep in her bed comfortably without pain or restlessness, return to work   Currently in Pain? No/denies   Pain Onset Other (comment)  03/18/16 surgery date      Objectives: Positioned in supine PROM of shoulder flexion, scaption, ER, and IR. 5X30" pt. required verbal and tactile cues  to relax shoulder musculature and allow PROM.  Supine 5x30 seconds grade 1-2 shoulder mobilizations posterior and inferior to facilitate motion, pt states that it feels "great to have her shoulder moved."  Supine 4x30" elbow extension stretching with supination. Pt felt good stretch over anterior forearm and "a little burning" over lateral forearm that subsided as stretch was held.  5 minutes of STM effleurage and cross friction over anterior/lateral forearm which pt stated felt "good." Burning in forearm with full extension and supination had subsided after STM.  Review of HEP, pendulum swings and scap retractions were performed to observe technique.  UE reacher 1 minute forward/back and 1 minute side to side. With verbal cuing to let reacher perform most of motion and not actively do to much. Pt noted a good stretch but stated it felt good.                               PT Long Term Goals - 04/14/16 1100      PT LONG TERM GOAL #1   Title Pts QuickDash score will decrease to 50 so she can perform ADLs with less difficulty   Baseline 75   Time 8   Period Weeks   Status New     PT LONG TERM GOAL #2   Title Pts ROM will be full in all directions to improve ability  to perform ADLs   Baseline PROM flexion 110, scaption 120, ER 70, IR 42   Time 8   Period Weeks   Status New     PT LONG TERM GOAL #3   Title pt will be independent with HEP to prevent reinjury and accelerate healing process   Baseline not independent with HEP   Time 8   Period Weeks   Status New               Plan - 04/23/16 [id number]    Clinical Impression Statement Pts AROM of elbow extension has increased and is now WNL. AROM supination continues to be limited PROM WNL with burning stretching feeling over lateral forearm. Strength and ROM of shoulder continues to be limited as expected post open RCR. Continue post-op protocol PROM until next MD appointment 04/28/16   Rehab Potential Good    Clinical Impairments Affecting Rehab Potential Positive: motivated, family support           Negative: medical comorbidities    PT Frequency 2x / week   PT Duration 8 weeks   PT Treatment/Interventions ADLs/Self Care Home Management;Functional mobility training;Therapeutic activities;Therapeutic exercise;Neuromuscular re-education;Patient/family education;Manual techniques;Passive range of motion;Dry needling;Scar mobilization   PT Next Visit Plan STM, PROM   Consulted and Agree with Plan of Care Patient      Patient will benefit from skilled therapeutic intervention in order to improve the following deficits and impairments:  Decreased activity tolerance, Decreased range of motion, Decreased strength, Hypomobility, Increased muscle spasms, Impaired UE functional use, Pain, Decreased balance  Visit Diagnosis: Acute pain of right shoulder  Stiffness of right shoulder, not elsewhere classified  Muscle weakness (generalized)     Problem List Patient Active Problem List   Diagnosis Date Noted  . HBP (high blood pressure) 01/17/2015  . Obesity 01/17/2015    Akshith Moncus PT DPT 04/23/2016, 8:45 AM  [place] [facility] [address] [place], [name], [address] Phone: [phone]   Fax:  [phone]  Name: Shane CrutchMary C Arabie MRN: [id number] Date of Birth: [birth year]

## 2016-04-28 ENCOUNTER — Ambulatory Visit: Payer: 59 | Admitting: Physical Therapy

## 2016-04-28 ENCOUNTER — Encounter: Payer: Self-pay | Admitting: Physical Therapy

## 2016-04-28 DIAGNOSIS — M25611 Stiffness of right shoulder, not elsewhere classified: Secondary | ICD-10-CM | POA: Diagnosis not present

## 2016-04-28 DIAGNOSIS — M25511 Pain in right shoulder: Secondary | ICD-10-CM | POA: Diagnosis not present

## 2016-04-28 DIAGNOSIS — M6281 Muscle weakness (generalized): Secondary | ICD-10-CM | POA: Diagnosis not present

## 2016-04-28 DIAGNOSIS — M25621 Stiffness of right elbow, not elsewhere classified: Secondary | ICD-10-CM | POA: Diagnosis not present

## 2016-04-28 NOTE — Therapy (Signed)
[place] [facility] [address], [place], [address] Phone: [phone]   Fax:  [phone]  Physical Therapy Treatment  Patient Details  Name: Kelli Chapman MRN: [id number] Date of Birth: Mar 10, 1961 Referring Provider: Altamese Cabal  Encounter Date: 04/28/2016      PT End of Session - 04/28/16 0842    Visit Number 4   Number of Visits 17   Date for PT Re-Evaluation 06/09/16   PT Start Time 0800   PT Stop Time 0840   PT Time Calculation (min) 40 min   Activity Tolerance No increased pain   Behavior During Therapy [facility] for tasks assessed/performed      Past Medical History:  Diagnosis Date  . Arthritis    right shoulder  . Hypertension     Past Surgical History:  Procedure Laterality Date  . NO PAST SURGERIES    . SHOULDER ARTHROSCOPY WITH OPEN ROTATOR CUFF REPAIR Right 03/18/2016   Procedure: SHOULDER ARTHROSCOPY WITH OPEN ROTATOR CUFF REPAIR Label debridement, Decompression;  Surgeon: Christena Flake, MD;  Location: [facility] ORS;  Service: Orthopedics;  Laterality: Right;    There were no vitals filed for this visit.      Subjective Assessment - 04/28/16 0840    Subjective Pt states that her shoulder is doing well today. She does say that she had one day since last visit where she was very sore after quickly trying to grab her grandson from running away from her and inadvertenly used her right shoulder to do so. She states that she was sore for the rest of that day but declines being in pain since then.   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   Limitations Lifting;Writing;House hold activities   Patient Stated Goals sleep in her bed comfortably without pain or restlessness, return to work   Currently in Pain? No/denies   Pain Onset Other (comment)  03/18/16 surgery date     Objective:  Seated, UE ranger forward to back  and side to side 1' each direction to facilitate motion and alleviate "stiffness." Verbal cuing required for proper posture and to not actively do the entire motion but actively begin and let ranger do stretching.  UE wand stretch flexion, abduction, ER 10X10" holds at point of tightness. Pt educated on proper positioning and required verbal cuing and demonstration throughout to correct technique.  Seated table slides to flexion 10 times forward and back to facilitate flexion mobility.  Supine PROM stretching flexion, ER, scaption 5x30" each direction as well as grade 1-2 posterior and inferior mobs 3x30" each to facilitate motion.  Supine elbow extension and supination PROM stretching 3x30." STM including effleurage, cross friction and compression to maintain gains made at previous visits and maintain decrease in burning in forearm during elbow extension.                                PT Long Term Goals - 04/14/16 1100      PT LONG TERM GOAL #1   Title Pts QuickDash score will decrease to 50 so she can perform ADLs with less difficulty   Baseline 75   Time 8   Period Weeks   Status New     PT LONG TERM GOAL #2   Title Pts ROM will be full in all directions to improve  ability to perform ADLs   Baseline PROM flexion 110, scaption 120, ER 70, IR 42   Time 8   Period Weeks   Status New     PT LONG TERM GOAL #3   Title pt will be independent with HEP to prevent reinjury and accelerate healing process   Baseline not independent with HEP   Time 8   Period Weeks   Status New               Plan - 04/28/16 16100842    Clinical Impression Statement Pts ROM of elbow extension and supination has increased and is WNL. Pt states that since last visit with STM she has not experienced in burning in her forearm and she believes that it is totatlly relieved. ROM of shoulder is increasing however is still limited as to be expected post open RCR. PROM flexion: 130,  Scaption: 131, IR 45, ER in 70 degrees abduction: 60.   Rehab Potential Good   Clinical Impairments Affecting Rehab Potential Positive: motivated, family support           Negative: medical comorbidities    PT Frequency 2x / week   PT Duration 8 weeks   PT Treatment/Interventions ADLs/Self Care Home Management;Functional mobility training;Therapeutic activities;Therapeutic exercise;Neuromuscular re-education;Patient/family education;Manual techniques;Passive range of motion;Dry needling;Scar mobilization   PT Next Visit Plan STM, PROM   Consulted and Agree with Plan of Care Patient      Patient will benefit from skilled therapeutic intervention in order to improve the following deficits and impairments:  Decreased activity tolerance, Decreased range of motion, Decreased strength, Hypomobility, Increased muscle spasms, Impaired UE functional use, Pain, Decreased balance  Visit Diagnosis: Stiffness of right shoulder, not elsewhere classified  Muscle weakness (generalized)     Problem List Patient Active Problem List   Diagnosis Date Noted  . HBP (high blood pressure) 01/17/2015  . Obesity 01/17/2015   Ron Ageehaffin, [place] SPT  Ronald Vinsant PT DPT 04/28/2016, 9:33 AM  [place] [facility] [address] [place], [name], [address] Phone: [phone]   Fax:  [phone]  Name: Shane CrutchMary C Schreckengost MRN: [id number] Date of Birth: [birth year]

## 2016-04-30 ENCOUNTER — Ambulatory Visit: Payer: 59 | Admitting: Physical Therapy

## 2016-05-06 ENCOUNTER — Encounter: Payer: Self-pay | Admitting: Physical Therapy

## 2016-05-06 ENCOUNTER — Ambulatory Visit: Payer: 59 | Attending: Orthopedic Surgery | Admitting: Physical Therapy

## 2016-05-06 DIAGNOSIS — R293 Abnormal posture: Secondary | ICD-10-CM | POA: Insufficient documentation

## 2016-05-06 DIAGNOSIS — M6281 Muscle weakness (generalized): Secondary | ICD-10-CM | POA: Insufficient documentation

## 2016-05-06 DIAGNOSIS — M25511 Pain in right shoulder: Secondary | ICD-10-CM | POA: Insufficient documentation

## 2016-05-06 DIAGNOSIS — M25611 Stiffness of right shoulder, not elsewhere classified: Secondary | ICD-10-CM | POA: Insufficient documentation

## 2016-05-06 NOTE — Therapy (Signed)
[place] [facility] [address] [place], [address] Phone: [phone]   Fax:  [phone]  Physical Therapy Treatment  Patient Details  Name: ALISI LUPIEN MRN: [id number] Date of Birth: 22-May-1960 Referring Provider: Altamese Cabal  Encounter Date: 05/06/2016      PT End of Session - 05/06/16 0901    Visit Number 5   Number of Visits 17   Date for PT Re-Evaluation 06/09/16   PT Start Time 0808   PT Stop Time 0847   PT Time Calculation (min) 39 min   Activity Tolerance No increased pain   Behavior During Therapy [facility] for tasks assessed/performed      Past Medical History:  Diagnosis Date  . Arthritis    right shoulder  . Hypertension     Past Surgical History:  Procedure Laterality Date  . NO PAST SURGERIES    . SHOULDER ARTHROSCOPY WITH OPEN ROTATOR CUFF REPAIR Right 03/18/2016   Procedure: SHOULDER ARTHROSCOPY WITH OPEN ROTATOR CUFF REPAIR Label debridement, Decompression;  Surgeon: Christena Flake, MD;  Location: [facility] ORS;  Service: Orthopedics;  Laterality: Right;    There were no vitals filed for this visit.      Subjective Assessment - 05/06/16 0853    Subjective Pt arrives this morning and apologized for missing last visit as she got her appointment day mixed up. She states that her shoulder is doing well and that she was very pleased after her MD appointment last week. She has returned to work on desk duty and states that she is out of her sling at all times except for when in public or at work. 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   Limitations Lifting;Writing;House hold activities   Patient Stated Goals sleep in her bed comfortably without pain or restlessness, return to work   Currently in  Pain? No/denies   Pain Onset Other (comment)  03/18/16 surgery date      Objective: Supine PROM flexion, scaption, and abduction 5x30 seconds each direction working into resistance. Pt stated that she felt "tight" and a "good stretch" however declined being in pain at any point during treatment.  5x30 seconds posterior and inferior glenohumeral glides varying grades 1-3 to facilitate motion and decrease "discomfort" with movement. Pt tolerated treatment well and stated she had no discomfort or pain.  UE reacher with verbal cuing to keep elbow straight and walk forward until shoulder felt a good flexion stretch and hold for 5 seconds. Repeated for 2 sets of 10. Tactile cuing to correct posture.  Ball rolls on table with yellow exercise ball in flexion, abduction and 45 degree angle. Verbal cuing to roll ball out until felt good stretch and hold for 5 seconds repeat 10 times each direction.  Wall slides flexion, abduction, and 45 degrees to stretch and hold for 5 seconds each direction 10 times. Tactile cuing to correct rounded shoulder posture. Pt instructed to do as HEP X10 twice a day with correct posture.  Pt tolerated all treatment and exercise well and declined any increase in pain throughout exercises.  PT Long Term Goals - 04/14/16 1100      PT LONG TERM GOAL #1   Title Pts QuickDash score will decrease to 50 so she can perform ADLs with less difficulty   Baseline 75   Time 8   Period Weeks   Status New     PT LONG TERM GOAL #2   Title Pts ROM will be full in all directions to improve ability to perform ADLs   Baseline PROM flexion 110, scaption 120, ER 70, IR 42   Time 8   Period Weeks   Status New     PT LONG TERM GOAL #3   Title pt will be independent with HEP to prevent reinjury and accelerate healing process   Baseline not independent with HEP   Time 8   Period Weeks   Status New                Plan - 05/06/16 16100902    Clinical Impression Statement Pts ROM of shoulder is still limited as to be expected post operatively however is improving. Flexion active and passive ROM ~120 and abduction ~90-100. Strength continues to be decreased as expected post RCR. Continue progressing AAROM to AROM and working to achieve full ROM.   Rehab Potential Good   Clinical Impairments Affecting Rehab Potential Positive: motivated, family support           Negative: medical comorbidities    PT Frequency 2x / week   PT Duration 8 weeks   PT Treatment/Interventions ADLs/Self Care Home Management;Functional mobility training;Therapeutic activities;Therapeutic exercise;Neuromuscular re-education;Patient/family education;Manual techniques;Passive range of motion;Dry needling;Scar mobilization   PT Next Visit Plan STM, PROM   Consulted and Agree with Plan of Care Patient      Patient will benefit from skilled therapeutic intervention in order to improve the following deficits and impairments:  Decreased activity tolerance, Decreased range of motion, Decreased strength, Hypomobility, Increased muscle spasms, Impaired UE functional use, Pain, Decreased balance  Visit Diagnosis: Stiffness of right shoulder, not elsewhere classified  Muscle weakness (generalized)  Abnormal posture     Problem List Patient Active Problem List   Diagnosis Date Noted  . HBP (high blood pressure) 01/17/2015  . Obesity 01/17/2015    Emonii Wienke PT DPT 05/06/2016, 11:16 AM  [place] [facility] [address] [place], [name], [address] Phone: [phone]   Fax:  [phone]  Name: Shane CrutchMary C Mangal MRN: [id number] Date of Birth: [birth year]

## 2016-05-08 ENCOUNTER — Ambulatory Visit: Payer: 59 | Admitting: Physical Therapy

## 2016-05-12 ENCOUNTER — Ambulatory Visit: Payer: 59 | Admitting: Physical Therapy

## 2016-05-12 ENCOUNTER — Encounter: Payer: Self-pay | Admitting: Physical Therapy

## 2016-05-12 DIAGNOSIS — M6281 Muscle weakness (generalized): Secondary | ICD-10-CM

## 2016-05-12 DIAGNOSIS — M25611 Stiffness of right shoulder, not elsewhere classified: Secondary | ICD-10-CM | POA: Diagnosis not present

## 2016-05-12 DIAGNOSIS — M25511 Pain in right shoulder: Secondary | ICD-10-CM

## 2016-05-12 DIAGNOSIS — R293 Abnormal posture: Secondary | ICD-10-CM | POA: Diagnosis not present

## 2016-05-12 NOTE — Therapy (Signed)
[place] [facility] [address], [place], [address] Phone: [phone]   Fax:  [phone]  Physical Therapy Treatment  Patient Details  Name: Kelli Chapman MRN: [id number] Date of Birth: February 16, 1961 Referring Provider: Altamese Cabal  Encounter Date: 05/12/2016      PT End of Session - 05/12/16 1106    Visit Number 6   Number of Visits 17   Date for PT Re-Evaluation 06/09/16   PT Start Time 1057   PT Stop Time 1141   PT Time Calculation (min) 44 min   Activity Tolerance No increased pain   Behavior During Therapy [facility] for tasks assessed/performed      Past Medical History:  Diagnosis Date  . Arthritis    right shoulder  . Hypertension     Past Surgical History:  Procedure Laterality Date  . NO PAST SURGERIES    . SHOULDER ARTHROSCOPY WITH OPEN ROTATOR CUFF REPAIR Right 03/18/2016   Procedure: SHOULDER ARTHROSCOPY WITH OPEN ROTATOR CUFF REPAIR Label debridement, Decompression;  Surgeon: Christena Flake, MD;  Location: [facility];  Service: Orthopedics;  Laterality: Right;    There were no vitals filed for this visit.      Subjective Assessment - 05/12/16 1059    Subjective Pt reports her shoulder is slightly irritated today. Pt states HEP has been going well, but is concerned about "popping something". Pt reports "feeling great" when not wearing her sling, but continues to wear it at work and church. Pt states she still has difficulty with household chores with her R shoulder and uses her L shoulder. Pt states she uses her R shoulder mostly for eating.    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   Limitations Lifting;Writing;House hold activities   Patient Stated Goals sleep in her bed comfortably without pain or restlessness, return to work   Currently in Pain? No/denies   Pain Score 0-No pain   Pain Onset --  03/18/16  surgery date     Objective: Seated shoulder pulley, to promote R shoulder flexion and abduction, pt cued to set scapula to avoid compensatory shoulder elevation. Pt feels like her shoulder is feeling "more loose". 15x each  Standing isometric shoulder punches, elbow bent to 90 deg at side, pushing fist into soccer ball against the wall. Standing isometric shoulder abduction, elbow bent to 90 deg at side, pushing elbow into soccer ball against the wall. To increase R shoulder strength. 10x each exercise. Pt reports feeling good.  Supine AAROM flexion, ER and abduction ER with PVC pipe, pt reports feeling "a good stretch". 5x each Verbal cuing correct scapular position with exercise.  Prone horizontal abduction "Ts" and "Ys" to increase middle trap, and lower trap strength. 10x each  PROM Flexion, abduction, scaption. 4x30 seconds each to facilitate increased ROM. Pt stated that she felt a good stretch in each direction and declined any increase in pain with conclusion of session.                          PT Education - 05/12/16 1104    Education provided Yes   Education Details HEP, use of brace   Person(s) Educated Patient   Methods Explanation;Demonstration   Comprehension Verbalized understanding;Returned demonstration             PT Long Term Goals - 04/14/16 1100  PT LONG TERM GOAL #1   Title Pts QuickDash score will decrease to 50 so she can perform ADLs with less difficulty   Baseline 75   Time 8   Period Weeks   Status New     PT LONG TERM GOAL #2   Title Pts ROM will be full in all directions to improve ability to perform ADLs   Baseline PROM flexion 110, scaption 120, ER 70, IR 42   Time 8   Period Weeks   Status New     PT LONG TERM GOAL #3   Title pt will be independent with HEP to prevent reinjury and accelerate healing process   Baseline not independent with HEP   Time 8   Period Weeks   Status New               Plan -  05/12/16 1119    Clinical Impression Statement Pt presents with no pain, mainly R shoulder tightness. Pt is gradually increasing R shoulder ROM and is progressing to AROM and gentle strengthening exercises. R shoulder flexion PROM 135, abd 115, ER at 70 degrees of abd 62.   Rehab Potential Good   Clinical Impairments Affecting Rehab Potential Positive: motivated, family support           Negative: medical comorbidities    PT Frequency 2x / week   PT Duration 8 weeks   PT Treatment/Interventions ADLs/Self Care Home Management;Functional mobility training;Therapeutic activities;Therapeutic exercise;Neuromuscular re-education;Patient/family education;Manual techniques;Passive range of motion;Dry needling;Scar mobilization   PT Next Visit Plan STM, PROM   Consulted and Agree with Plan of Care Patient      Patient will benefit from skilled therapeutic intervention in order to improve the following deficits and impairments:  Decreased activity tolerance, Decreased range of motion, Decreased strength, Hypomobility, Increased muscle spasms, Impaired UE functional use, Pain, Decreased balance  Visit Diagnosis: Stiffness of right shoulder, not elsewhere classified  Muscle weakness (generalized)  Acute pain of right shoulder     Problem List Patient Active Problem List   Diagnosis Date Noted  . HBP (high blood pressure) 01/17/2015  . Obesity 01/17/2015    Okechukwu Regnier PT DPT 05/12/2016, 3:58 PM  [place] [facility] [address] [place], [name], [address] Phone: [phone]   Fax:  [phone]  Name: Shane CrutchMary C Marschall MRN: [id number] Date of Birth: [birth year]

## 2016-05-15 ENCOUNTER — Encounter: Payer: 59 | Admitting: Physical Therapy

## 2016-05-19 ENCOUNTER — Ambulatory Visit: Payer: 59 | Admitting: Physical Therapy

## 2016-05-19 DIAGNOSIS — R293 Abnormal posture: Secondary | ICD-10-CM | POA: Diagnosis not present

## 2016-05-19 DIAGNOSIS — M25511 Pain in right shoulder: Secondary | ICD-10-CM | POA: Diagnosis not present

## 2016-05-19 DIAGNOSIS — M25611 Stiffness of right shoulder, not elsewhere classified: Secondary | ICD-10-CM

## 2016-05-19 DIAGNOSIS — M6281 Muscle weakness (generalized): Secondary | ICD-10-CM

## 2016-05-19 NOTE — Therapy (Signed)
[place] [facility] [address] [place], [name], [address] Phone: [phone]   Fax:  [phone]  Physical Therapy Treatment  Patient Details  Name: Kelli CrutchMary C Wasco MRN: [id number] Date of Birth: 1960-11-22 Referring Provider: Altamese CabalMaurice Jones  Encounter Date: 05/19/2016      PT End of Session - 05/19/16 1043    Visit Number 7   Number of Visits 17   Date for PT Re-Evaluation 06/09/16   PT Start Time 1016   PT Stop Time 1050   PT Time Calculation (min) 34 min   Activity Tolerance No increased pain   Behavior During Therapy [facility] for tasks assessed/performed      Past Medical History:  Diagnosis Date  . Arthritis    right shoulder  . Hypertension     Past Surgical History:  Procedure Laterality Date  . NO PAST SURGERIES    . SHOULDER ARTHROSCOPY WITH OPEN ROTATOR CUFF REPAIR Right 03/18/2016   Procedure: SHOULDER ARTHROSCOPY WITH OPEN ROTATOR CUFF REPAIR Label debridement, Decompression;  Surgeon: Christena FlakeJohn J Poggi, MD;  Location: [facility] OR[facility];  Service: Orthopedics;  Laterality: Right;    There were no vitals filed for this visit.      Subjective Assessment - 05/19/16 1034    Subjective Pt reports her L shoulder is sore today, possibly due to increased use of her shoulder with pulley exercises as part of therapy. Pt states she is still nervous about overhead movements for fear of "popping something". Pt states that she has not been compliant with HEP. She states that she was afraid that due to the uncomfortable nature of the HEP that she would damage her surgery.    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   Limitations Lifting;Writing;House hold activities   Patient Stated Goals sleep in her bed comfortably without pain or restlessness, return to work   Currently in Pain? Yes   Pain Score 3    Pain Location Shoulder   Pain Orientation  Right   Pain Descriptors / Indicators Sore   Pain Type Surgical pain   Pain Onset --  03/18/16 surgery date     Objective:  AAROM on the pulleys flexion, scaption, and abduction. Verbal cuing to correct hand placement and prevent impingement with elevation. Pt provided pulley system and instructed to perform at home focusing on good form.  UE reacher AAROM flexion, scaption, abduction. Verbal cuing to take shoulder to end range where pt felt a good stretch and hold. Pt stated that she felt a good stretch but declined an increase in pain with exercise.  Wall slides into flexion with verbal and tactile cuing to set scapulas prior to motion. Pt stated that she felt a good stretch at the top and was instructed to hold for 5 seconds before sliding back down.   GTB scapular retractions 2x12. Required verbal cuing to set scapulas before exercise and eccentrically control the force of the TB wanting to pull scapulas into protraction. Pt stated that she felt a good exercise and was given a GTB and was told to perform at home  Prone horizontal abduction with verbal and tactile cuing to squeeze lower traps and hold isometrically for 3 seconds before eccentrically controlling arm on way down. 2x12. Pt stated that she felt a good exercise and was instructed to perform at home.  PT Education - 05/19/16 1042    Education provided Yes   Education Details HEP   Person(s) Educated Patient   Methods Explanation;Demonstration;Tactile cues;Verbal cues   Comprehension Verbalized understanding;Returned demonstration             PT Long Term Goals - 04/14/16 1100      PT LONG TERM GOAL #1   Title Pts QuickDash score will decrease to 50 so she can perform ADLs with less difficulty   Baseline 75   Time 8   Period Weeks   Status New     PT LONG TERM GOAL #2   Title Pts ROM will be full in all directions to improve ability to perform ADLs   Baseline  PROM flexion 110, scaption 120, ER 70, IR 42   Time 8   Period Weeks   Status New     PT LONG TERM GOAL #3   Title pt will be independent with HEP to prevent reinjury and accelerate healing process   Baseline not independent with HEP   Time 8   Period Weeks   Status New               Plan - 05/19/16 1208    Clinical Impression Statement Pt presents with an increase in pain today from the time she woke up. She states that when she woke up on her stomach with her hands over head which was very painful. ROM continues to be limited compared bilaterally. Pt scapular retraction strength continues to be weak causing scapular dyskinesis during motion contributing to limited ROM. Scapular retraction and ROM exercises as HEP were emphasized and pt states that she will be more compliant with HEP.   Rehab Potential Good   Clinical Impairments Affecting Rehab Potential Positive: motivated, family support           Negative: medical comorbidities    PT Frequency 2x / week   PT Duration 8 weeks   PT Treatment/Interventions ADLs/Self Care Home Management;Functional mobility training;Therapeutic activities;Therapeutic exercise;Neuromuscular re-education;Patient/family education;Manual techniques;Passive range of motion;Dry needling;Scar mobilization   PT Next Visit Plan STM, PROM   Consulted and Agree with Plan of Care Patient      Patient will benefit from skilled therapeutic intervention in order to improve the following deficits and impairments:  Decreased activity tolerance, Decreased range of motion, Decreased strength, Hypomobility, Increased muscle spasms, Impaired UE functional use, Pain, Decreased balance, Postural dysfunction  Visit Diagnosis: Stiffness of right shoulder, not elsewhere classified  Muscle weakness (generalized)  Acute pain of right shoulder     Problem List Patient Active Problem List   Diagnosis Date Noted  . HBP (high blood pressure) 01/17/2015  . Obesity  01/17/2015    Fisher,Benjamin PT DPT 05/19/2016, 4:50 PM  [place] [facility] [address], [place], [address] Phone: [phone]   Fax:  [phone]  Name: ASHIYAH PAVLAK MRN: [id number] Date of Birth: 07-16-1960

## 2016-05-21 ENCOUNTER — Encounter: Payer: Self-pay | Admitting: Physical Therapy

## 2016-05-21 ENCOUNTER — Ambulatory Visit: Payer: 59 | Admitting: Physical Therapy

## 2016-05-21 DIAGNOSIS — M6281 Muscle weakness (generalized): Secondary | ICD-10-CM

## 2016-05-21 DIAGNOSIS — M25611 Stiffness of right shoulder, not elsewhere classified: Secondary | ICD-10-CM

## 2016-05-21 DIAGNOSIS — M25511 Pain in right shoulder: Secondary | ICD-10-CM

## 2016-05-21 DIAGNOSIS — R293 Abnormal posture: Secondary | ICD-10-CM | POA: Diagnosis not present

## 2016-05-22 ENCOUNTER — Encounter: Payer: 59 | Admitting: Physical Therapy

## 2016-05-22 NOTE — Therapy (Signed)
[place] [facility] PHYSICAL AND SPORTS MEDICINE [address] [place], [name], [address] Phone: [phone]   Fax:  [phone]  Physical Therapy Treatment  Patient Details  Name: Kelli Chapman MRN: [id number] Date of Birth: 20-Dec-1960 Referring Provider: Altamese CabalMaurice Jones  Encounter Date: 05/21/2016      PT End of Session - 05/21/16 0956    Visit Number 8   Number of Visits 17   Date for PT Re-Evaluation 06/09/16   PT Start Time 0855   PT Stop Time 0948   PT Time Calculation (min) 53 min   Activity Tolerance No increased pain   Behavior During Therapy [facility] for tasks assessed/performed      Past Medical History:  Diagnosis Date  . Arthritis    right shoulder  . Hypertension     Past Surgical History:  Procedure Laterality Date  . NO PAST SURGERIES    . SHOULDER ARTHROSCOPY WITH OPEN ROTATOR CUFF REPAIR Right 03/18/2016   Procedure: SHOULDER ARTHROSCOPY WITH OPEN ROTATOR CUFF REPAIR Label debridement, Decompression;  Surgeon: Christena FlakeJohn J Poggi, MD;  Location: [facility] ORS;  Service: Orthopedics;  Laterality: Right;    There were no vitals filed for this visit.      Subjective Assessment - 05/21/16 0959    Subjective Pt states that her shoulder is doing well and that she has not been in pain since the last visit. She states that she feels like she can do more than she is currently doing at home however is fearful to "pop something" in her shoulder and she does not want to "ruin the surgery". 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   Limitations Lifting;Writing;House hold activities   Patient Stated Goals sleep in her bed comfortably without pain or restlessness, return to work   Currently in Pain? No/denies   Pain Score 0-No pain   Pain Onset --  03/18/16 surgery date       Objective: Pulley flexion, scaption, and abduction with verbal cuing to correct pt compensation of lateral trunk flexion as well as hold good stretch at top of exercise for 5 seconds.    UE ranger flexion x10 with 5 second holds abduction x10 with 5 second holds and flexion to abduction x10. Verbal and tactile cuing to correct scapula positioning.  IR and ER Isometric holds with GTB x10 for 5 second holds each. Verbal cuing to not allow TB to pull arm into motion trying to resist.  Placing 10 objects from 7 foot shelf to 6 foot shelf then back to the top shelf. Verbal cuing to correct scapula positioning throughout exercise.  Supine PROM flexion, scaption and abduction. Pt stated that she felt a good stretch in every direction. Stretch held 5x30" in each direction. Posterior and inferior joint mobs 5x30" in each direction grade 3. Pt stated that mobilizations felt good.   Flexion-150 Abduction-135 ER at 90 degrees of abduction- 83 IR at 90 degrees of abduction- 48                          PT Education - 05/21/16 0955    Education provided Yes   Education Details Current precautions   Person(s) Educated Patient   Methods Explanation   Comprehension Verbalized understanding  PT Long Term Goals - 04/14/16 1100      PT LONG TERM GOAL #1   Title Pts QuickDash score will decrease to 50 so she can perform ADLs with less difficulty   Baseline 75   Time 8   Period Weeks   Status New     PT LONG TERM GOAL #2   Title Pts ROM will be full in all directions to improve ability to perform ADLs   Baseline PROM flexion 110, scaption 120, ER 70, IR 42   Time 8   Period Weeks   Status New     PT LONG TERM GOAL #3   Title pt will be independent with HEP to prevent reinjury and accelerate healing process   Baseline not independent with HEP   Time 8   Period Weeks   Status New               Plan - 05/21/16 7829    Clinical Impression  Statement Pt ROM continues to be limited but is improving. Flexion-150, abduction-135, ER at 90-83, IR at 90-48. Her strength continues to be decreased post open RCR. Pt is 10 weeks out of surgery 05/27/16 and can progress to more advanced strengthening exercises. Continue ROM and strengthening exercises. Pt asked about no longer wearing the sling and was instructed to speak to the MD about discontinuning use.    Rehab Potential Good   Clinical Impairments Affecting Rehab Potential Positive: motivated, family support           Negative: medical comorbidities    PT Frequency 2x / week   PT Duration 8 weeks   PT Treatment/Interventions ADLs/Self Care Home Management;Functional mobility training;Therapeutic activities;Therapeutic exercise;Neuromuscular re-education;Patient/family education;Manual techniques;Passive range of motion;Dry needling;Scar mobilization   PT Next Visit Plan Progress AROM and begin more aggressive strengthening   Consulted and Agree with Plan of Care Patient      Patient will benefit from skilled therapeutic intervention in order to improve the following deficits and impairments:  Decreased activity tolerance, Decreased range of motion, Decreased strength, Hypomobility, Increased muscle spasms, Impaired UE functional use, Pain, Decreased balance, Postural dysfunction  Visit Diagnosis: Stiffness of right shoulder, not elsewhere classified  Muscle weakness (generalized)  Acute pain of right shoulder     Problem List Patient Active Problem List   Diagnosis Date Noted  . HBP (high blood pressure) 01/17/2015  . Obesity 01/17/2015    Fanchon Papania PT DPT 05/22/2016, 7:17 AM  [place] [facility] PHYSICAL AND SPORTS MEDICINE [address], [place], [address] Phone: [phone]   Fax:  [phone]  Name: Kelli Chapman MRN: [id number] Date of Birth: Oct 09, 1960

## 2016-05-26 ENCOUNTER — Ambulatory Visit: Payer: 59 | Admitting: Physical Therapy

## 2016-05-26 DIAGNOSIS — M25611 Stiffness of right shoulder, not elsewhere classified: Secondary | ICD-10-CM

## 2016-05-26 DIAGNOSIS — M25511 Pain in right shoulder: Secondary | ICD-10-CM | POA: Diagnosis not present

## 2016-05-26 DIAGNOSIS — M6281 Muscle weakness (generalized): Secondary | ICD-10-CM | POA: Diagnosis not present

## 2016-05-26 DIAGNOSIS — R293 Abnormal posture: Secondary | ICD-10-CM | POA: Diagnosis not present

## 2016-05-26 NOTE — Therapy (Signed)
[facility] [address], [place], [address] Phone: [phone]   Fax:  [phone]  Physical Therapy Treatment  Patient Details  Name: Kelli Chapman MRN: [id number] Date of Birth: [birth year] Referring Provider: Altamese Cabal  Encounter Date: 05/26/2016      PT End of Session - 05/26/16 0927    Visit Number 9   Number of Visits 17   Date for PT Re-Evaluation 06/09/16   PT Start Time 0924   PT Stop Time 1004   PT Time Calculation (min) 40 min   Activity Tolerance No increased pain   Behavior During Therapy [facility] for tasks assessed/performed      Past Medical History:  Diagnosis Date  . Arthritis    right shoulder  . Hypertension     Past Surgical History:  Procedure Laterality Date  . NO PAST SURGERIES    . SHOULDER ARTHROSCOPY WITH OPEN ROTATOR CUFF REPAIR Right 03/18/2016   Procedure: SHOULDER ARTHROSCOPY WITH OPEN ROTATOR CUFF REPAIR Label debridement, Decompression;  Surgeon: Christena Flake, MD;  Location: [facility] OR[facility];  Service: Orthopedics;  Laterality: Right;    There were no vitals filed for this visit.      Subjective Assessment - 05/26/16 0926    Subjective Pt reports she is doing well, has had no pain at this time. She is avoiding vacuuming, Otherwise she feels she is doing well.   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   Limitations Lifting;Writing;House hold activities   Patient Stated Goals sleep in her bed comfortably without pain or restlessness, return to work   Currently in Pain? No/denies   Pain Onset --  03/18/16 surgery date               Objective: Supine GH joint mobs 5x1 min inferior glides, 5x1 AP glides performed at end range for abduction.  Following this pt ROM improved from 140 to 160 deg elevation.   YTB scapular retractions 3x15 with manual tapping for rhomboid  activation.  YTB low rows 3x10 with manual overpressure to avoid shoulder elevation/shrug.  RTB pull aparts 3x10, at crossed angles 3x10   AROM against wall focusing on improving thoracic extension with shoulder flexion. 5 min total.                   PT Education - 05/26/16 0927    Education provided Yes   Education Details progress HEP             PT Long Term Goals - 04/14/16 1100      PT LONG TERM GOAL #1   Title Pts QuickDash score will decrease to 50 so she can perform ADLs with less difficulty   Baseline 75   Time 8   Period Weeks   Status New     PT LONG TERM GOAL #2   Title Pts ROM will be full in all directions to improve ability to perform ADLs   Baseline PROM flexion 110, scaption 120, ER 70, IR 42   Time 8   Period Weeks   Status New     PT LONG TERM GOAL #3   Title pt will be independent with HEP to prevent reinjury and accelerate healing process   Baseline not independent with HEP   Time 8   Period Weeks   Status New  Plan - 05/26/16 [id number]    Clinical Impression Statement Pt primary restriction is still ROM. Flexion to 160 in supine, 150 in standing. Focus of later half of session on improving standing ROM. Pt will continue to benfit from focus on ROM and functional strengthening.   Rehab Potential Good   Clinical Impairments Affecting Rehab Potential Positive: motivated, family support           Negative: medical comorbidities    PT Frequency 2x / week   PT Duration 8 weeks   PT Treatment/Interventions ADLs/Self Care Home Management;Functional mobility training;Therapeutic activities;Therapeutic exercise;Neuromuscular re-education;Patient/family education;Manual techniques;Passive range of motion;Dry needling;Scar mobilization   PT Next Visit Plan Progress AROM and begin more aggressive strengthening   Consulted and Agree with Plan of Care Patient      Patient will benefit from skilled therapeutic intervention  in order to improve the following deficits and impairments:  Decreased activity tolerance, Decreased range of motion, Decreased strength, Hypomobility, Increased muscle spasms, Impaired UE functional use, Pain, Decreased balance, Postural dysfunction  Visit Diagnosis: Stiffness of right shoulder, not elsewhere classified  Muscle weakness (generalized)     Problem List Patient Active Problem List   Diagnosis Date Noted  . HBP (high blood pressure) 01/17/2015  . Obesity 01/17/2015    Fisher,Benjamin PT DPT 05/26/2016, 10:05 AM   [facility] PHYS[facility]TS MEDICINE [address] , [name], [address] Phone: [phone]   Fax:  [phone]  Name: Kelli Chapman MRN: [id number] Date of Birth: [birth year]

## 2016-05-28 ENCOUNTER — Ambulatory Visit: Payer: 59 | Admitting: Physical Therapy

## 2016-05-28 DIAGNOSIS — M25611 Stiffness of right shoulder, not elsewhere classified: Secondary | ICD-10-CM

## 2016-05-28 DIAGNOSIS — M6281 Muscle weakness (generalized): Secondary | ICD-10-CM | POA: Diagnosis not present

## 2016-05-28 DIAGNOSIS — M25511 Pain in right shoulder: Secondary | ICD-10-CM | POA: Diagnosis not present

## 2016-05-28 DIAGNOSIS — R293 Abnormal posture: Secondary | ICD-10-CM | POA: Diagnosis not present

## 2016-05-28 NOTE — Therapy (Addendum)
[place] [facility]TS MEDICINE [address] [place], [address] Phone: [phone]   Fax:  [phone]  Physical Therapy Treatment  Patient Details  Name: Kelli Chapman MRN: [id number] Date of Birth: 05-15-1960 Referring Provider: Altamese Cabal  Encounter Date: 05/28/2016      PT End of Session - 05/28/16 0949    Visit Number 10   Number of Visits 17   Date for PT Re-Evaluation 06/09/16   PT Start Time 0942   PT Stop Time 1005   PT Time Calculation (min) 23 min   Activity Tolerance No increased pain   Behavior During Therapy [facility] for tasks assessed/performed      Past Medical History:  Diagnosis Date  . Arthritis    right shoulder  . Hypertension     Past Surgical History:  Procedure Laterality Date  . NO PAST SURGERIES    . SHOULDER ARTHROSCOPY WITH OPEN ROTATOR CUFF REPAIR Right 03/18/2016   Procedure: SHOULDER ARTHROSCOPY WITH OPEN ROTATOR CUFF REPAIR Label debridement, Decompression;  Surgeon: Christena Flake, MD;  Location: [facility];  Service: Orthopedics;  Laterality: Right;    There were no vitals filed for this visit.      Subjective Assessment - 05/28/16 0945    Subjective Pt reports incr. pain yesterday which she attributes to the cold and weather. She did no exercise yesterday.   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   Limitations Lifting;Writing;House hold activities   Patient Stated Goals sleep in her bed comfortably without pain or restlessness, return to work   Currently in Pain? Yes   Pain Score 3    Pain Location Shoulder   Pain Orientation Right   Pain Onset --  03/18/16 surgery date         Objective: GH joint mobs at 90 deg abduction AP, inferior, 5x1 min reps.  Manual stretching for shoulder flexion, abduction, ER, 3x1 min each.  Following this shoulder flexion improved by 20  deg.                        PT Education - 05/28/16 0946    Education provided Yes   Education Details stretching and the role of joint mobs.   Person(s) Educated Patient   Methods Explanation   Comprehension Verbalized understanding             PT Long Term Goals - 04/14/16 1100      PT LONG TERM GOAL #1   Title Pts QuickDash score will decrease to 50 so she can perform ADLs with less difficulty   Baseline 75   Time 8   Period Weeks   Status New     PT LONG TERM GOAL #2   Title Pts ROM will be full in all directions to improve ability to perform ADLs   Baseline PROM flexion 110, scaption 120, ER 70, IR 42   Time 8   Period Weeks   Status New     PT LONG TERM GOAL #3   Title pt will be independent with HEP to prevent reinjury and accelerate healing process   Baseline not independent with HEP   Time 8   Period Weeks   Status New               Plan - 05/28/16 6962    Clinical Impression Statement Pt demonstrated  supine flexion to 170 deg. Noted significant improvement in ROM and decr. in pain/stiffness with thoracic mobs so will continue those at next session.   Rehab Potential Good   Clinical Impairments Affecting Rehab Potential Positive: motivated, family support           Negative: medical comorbidities    PT Frequency 2x / week   PT Duration 8 weeks   PT Treatment/Interventions ADLs/Self Care Home Management;Functional mobility training;Therapeutic activities;Therapeutic exercise;Neuromuscular re-education;Patient/family education;Manual techniques;Passive range of motion;Dry needling;Scar mobilization   PT Next Visit Plan Progress AROM and begin more aggressive strengthening   Consulted and Agree with Plan of Care Patient      Patient will benefit from skilled therapeutic intervention in order to improve the following deficits and impairments:  Decreased activity tolerance, Decreased range of motion, Decreased strength, Hypomobility,  Increased muscle spasms, Impaired UE functional use, Pain, Decreased balance, Postural dysfunction  Visit Diagnosis: Stiffness of right shoulder, not elsewhere classified     Problem List Patient Active Problem List   Diagnosis Date Noted  . HBP (high blood pressure) 01/17/2015  . Obesity 01/17/2015    Jaclin Finks PT DPT 05/28/2016, 10:21 AM  [place] [facility] [address] [place], [name], [address] Phone: [phone]   Fax:  [phone]  Name: Kelli Chapman MRN: [id number] Date of Birth: 06-Jan-1961

## 2016-05-29 ENCOUNTER — Encounter: Payer: 59 | Admitting: Physical Therapy

## 2016-06-02 ENCOUNTER — Ambulatory Visit: Payer: 59 | Attending: Orthopedic Surgery | Admitting: Physical Therapy

## 2016-06-02 DIAGNOSIS — M6281 Muscle weakness (generalized): Secondary | ICD-10-CM | POA: Insufficient documentation

## 2016-06-02 DIAGNOSIS — M25511 Pain in right shoulder: Secondary | ICD-10-CM | POA: Insufficient documentation

## 2016-06-02 DIAGNOSIS — M25611 Stiffness of right shoulder, not elsewhere classified: Secondary | ICD-10-CM | POA: Diagnosis not present

## 2016-06-02 NOTE — Therapy (Signed)
[facility] MEDICINE [address] [place], [address] Phone: [phone]   Fax:  [phone]  Physical Therapy Treatment  Patient Details  Name: DUSTYN ARMBRISTER MRN: [id number] Date of Birth: Aug 27, 1960 Referring Provider: Altamese Cabal  Encounter Date: 06/02/2016      PT End of Session - 06/02/16 1053    Visit Number 11   Number of Visits 17   Date for PT Re-Evaluation 06/09/16   PT Start Time 1048   PT Stop Time 1120   PT Time Calculation (min) 32 min   Activity Tolerance No increased pain   Behavior During Therapy [facility] for tasks assessed/performed      Past Medical History:  Diagnosis Date  . Arthritis    right shoulder  . Hypertension     Past Surgical History:  Procedure Laterality Date  . NO PAST SURGERIES    . SHOULDER ARTHROSCOPY WITH OPEN ROTATOR CUFF REPAIR Right 03/18/2016   Procedure: SHOULDER ARTHROSCOPY WITH OPEN ROTATOR CUFF REPAIR Label debridement, Decompression;  Surgeon: Christena Flake, MD;  Location: [facility];  Service: Orthopedics;  Laterality: Right;    There were no vitals filed for this visit.      Subjective Assessment - 06/02/16 1051    Subjective Pt reports her pain level incr. significantly which may be due to her sleeping on her shoulder.   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   Limitations Lifting;Writing;House hold activities   Patient Stated Goals sleep in her bed comfortably without pain or restlessness, return to work   Currently in Pain? Yes   Pain Score 3    Pain Location Shoulder   Pain Orientation Right   Pain Onset --  03/18/16 surgery date               Objective: Standing ball taps focusing on reach with internal rotation, manual cuing for avoiding scapular shrug.   Standing overhead practice, initially with UE ranger progressing to manual assistance for improved  scapular control.   four corner taps with soccer ball with correction for improved motor control, x5 min total.  Standing scapular retraction, protraction with manual resistance, 3x10 each with 3 sec. Holds.                PT Education - 06/02/16 1052    Education provided Yes   Education Details pain with sleeping on shoulder   Person(s) Educated Patient   Methods Explanation   Comprehension Verbalized understanding             PT Long Term Goals - 04/14/16 1100      PT LONG TERM GOAL #1   Title Pts QuickDash score will decrease to 50 so she can perform ADLs with less difficulty   Baseline 75   Time 8   Period Weeks   Status New     PT LONG TERM GOAL #2   Title Pts ROM will be full in all directions to improve ability to perform ADLs   Baseline PROM flexion 110, scaption 120, ER 70, IR 42   Time 8   Period Weeks   Status New     PT LONG TERM GOAL #3   Title pt will be independent with HEP to prevent reinjury and accelerate healing process   Baseline not independent with HEP   Time 8   Period Weeks   Status New  Plan - 06/02/16 1124    Clinical Impression Statement Pt limited ROM today appears to be related to poor scapular control, so focus of session on this. Pt requested to end session early due to a busy day at work. Noted poor scapular control both with eccentric and concentric positions. Able to correct with manual correction, unable to correct with purely verbal corrections.    Rehab Potential Good   Clinical Impairments Affecting Rehab Potential Positive: motivated, family support           Negative: medical comorbidities    PT Frequency 2x / week   PT Duration 8 weeks   PT Treatment/Interventions ADLs/Self Care Home Management;Functional mobility training;Therapeutic activities;Therapeutic exercise;Neuromuscular re-education;Patient/family education;Manual techniques;Passive range of motion;Dry needling;Scar mobilization    PT Next Visit Plan Progress AROM and begin more aggressive strengthening   Consulted and Agree with Plan of Care Patient      Patient will benefit from skilled therapeutic intervention in order to improve the following deficits and impairments:  Decreased activity tolerance, Decreased range of motion, Decreased strength, Hypomobility, Increased muscle spasms, Impaired UE functional use, Pain, Decreased balance, Postural dysfunction  Visit Diagnosis: Stiffness of right shoulder, not elsewhere classified     Problem List Patient Active Problem List   Diagnosis Date Noted  . HBP (high blood pressure) 01/17/2015  . Obesity 01/17/2015    Fisher,Benjamin PT DPT 06/02/2016, 11:42 AM  [place] [facility] [address] [place], [name], [address] Phone: [phone]   Fax:  [phone]  Name: Shane CrutchMary C Naji MRN: [id number] Date of Birth: 02/16/1961

## 2016-06-03 ENCOUNTER — Other Ambulatory Visit: Payer: Self-pay | Admitting: Family Medicine

## 2016-06-03 DIAGNOSIS — I10 Essential (primary) hypertension: Secondary | ICD-10-CM

## 2016-06-04 ENCOUNTER — Ambulatory Visit: Payer: 59 | Admitting: Physical Therapy

## 2016-06-04 DIAGNOSIS — M25611 Stiffness of right shoulder, not elsewhere classified: Secondary | ICD-10-CM | POA: Diagnosis not present

## 2016-06-04 DIAGNOSIS — M25511 Pain in right shoulder: Secondary | ICD-10-CM | POA: Diagnosis not present

## 2016-06-04 DIAGNOSIS — M6281 Muscle weakness (generalized): Secondary | ICD-10-CM | POA: Diagnosis not present

## 2016-06-04 NOTE — Therapy (Signed)
[place] [facility] [address] [place], [address] Phone: [phone]   Fax:  [phone]  Physical Therapy Treatment  Patient Details  Name: Kelli Chapman MRN: [id number] Date of Birth: 1960-07-31 Referring Provider: Altamese Cabal  Encounter Date: 06/04/2016      PT End of Session - 06/04/16 0857    Visit Number 12   Number of Visits 17   Date for PT Re-Evaluation 06/09/16   PT Start Time 0850   PT Stop Time 0930   PT Time Calculation (min) 40 min   Activity Tolerance No increased pain   Behavior During Therapy [facility] for tasks assessed/performed      Past Medical History:  Diagnosis Date  . Arthritis    right shoulder  . Hypertension     Past Surgical History:  Procedure Laterality Date  . NO PAST SURGERIES    . SHOULDER ARTHROSCOPY WITH OPEN ROTATOR CUFF REPAIR Right 03/18/2016   Procedure: SHOULDER ARTHROSCOPY WITH OPEN ROTATOR CUFF REPAIR Label debridement, Decompression;  Surgeon: Christena Flake, MD;  Location: [facility] ORS;  Service: Orthopedics;  Laterality: Right;    There were no vitals filed for this visit.      Subjective Assessment - 06/04/16 0857    Subjective Pt reports no change in her symptoms since previous session.   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   Limitations Lifting;Writing;House hold activities   Patient Stated Goals sleep in her bed comfortably without pain or restlessness, return to work   Currently in Pain? No/denies   Pain Onset --  03/18/16 surgery date                     Objective:  Supine GH joint mobs 3x1 min at end range for flexion, abduction in multiple rounds in multiple directions 30 min total.  Graston and STM using tool #4 using J stroke, sweep and fan techniques, as well as cross friction massage.  Following this ROM improved with manual stretching to 170  deg. Pain free. Noted significant hypomobility initially with inferior glide and AP glides at 90 deg. Abduction so spent more time with this, improved considerably with tx.                  PT Long Term Goals - 04/14/16 1100      PT LONG TERM GOAL #1   Title Pts QuickDash score will decrease to 50 so she can perform ADLs with less difficulty   Baseline 75   Time 8   Period Weeks   Status New     PT LONG TERM GOAL #2   Title Pts ROM will be full in all directions to improve ability to perform ADLs   Baseline PROM flexion 110, scaption 120, ER 70, IR 42   Time 8   Period Weeks   Status New     PT LONG TERM GOAL #3   Title pt will be independent with HEP to prevent reinjury and accelerate healing process   Baseline not independent with HEP   Time 8   Period Weeks   Status New               Plan - 06/04/16 6962    Clinical Impression Statement ROM to 175 elevation today following therapy, initially 155 deg. Pt tolerated session well, would benefit from additional focal joint mobilization  work to maintain improvements in ROM. Look to progress standing AROM with scapular control exercises at next session.   Rehab Potential Good   Clinical Impairments Affecting Rehab Potential Positive: motivated, family support           Negative: medical comorbidities    PT Frequency 2x / week   PT Duration 8 weeks   PT Treatment/Interventions ADLs/Self Care Home Management;Functional mobility training;Therapeutic activities;Therapeutic exercise;Neuromuscular re-education;Patient/family education;Manual techniques;Passive range of motion;Dry needling;Scar mobilization   PT Next Visit Plan Progress AROM and begin more aggressive strengthening   Consulted and Agree with Plan of Care Patient      Patient will benefit from skilled therapeutic intervention in order to improve the following deficits and impairments:  Decreased activity tolerance, Decreased range of motion, Decreased  strength, Hypomobility, Increased muscle spasms, Impaired UE functional use, Pain, Decreased balance, Postural dysfunction  Visit Diagnosis: Stiffness of right shoulder, not elsewhere classified     Problem List Patient Active Problem List   Diagnosis Date Noted  . HBP (high blood pressure) 01/17/2015  . Obesity 01/17/2015    Fisher,Benjamin PT DPT 06/04/2016, 9:27 AM  [facility] [address] [place], [name], [address] Phone: [phone]   Fax:  [phone]  Name: Kelli CrutchMary C Chapman MRN: [id number] Date of Birth: [birth year]

## 2016-06-05 ENCOUNTER — Encounter: Payer: Self-pay | Admitting: Physical Therapy

## 2016-06-09 ENCOUNTER — Ambulatory Visit: Payer: 59 | Admitting: Physical Therapy

## 2016-06-11 ENCOUNTER — Ambulatory Visit: Payer: 59

## 2016-06-11 DIAGNOSIS — M25611 Stiffness of right shoulder, not elsewhere classified: Secondary | ICD-10-CM

## 2016-06-11 DIAGNOSIS — M25511 Pain in right shoulder: Secondary | ICD-10-CM | POA: Diagnosis not present

## 2016-06-11 DIAGNOSIS — M6281 Muscle weakness (generalized): Secondary | ICD-10-CM | POA: Diagnosis not present

## 2016-06-11 NOTE — Therapy (Signed)
[place] [facility] [address] [place], [address] Phone: [phone]   Fax:  [phone]  Physical Therapy Treatment  Patient Details  Name: Kelli Chapman MRN: [id number] Date of Birth: 1960-10-01 Referring Provider: Altamese Cabal  Encounter Date: 06/11/2016      PT End of Session - 06/12/16 2051    Visit Number 13   Number of Visits 33   Date for PT Re-Evaluation 08/06/16   PT Start Time 0900   PT Stop Time 0950   PT Time Calculation (min) 50 min   Activity Tolerance Patient tolerated treatment well   Behavior During Therapy [facility] for tasks assessed/performed      Past Medical History:  Diagnosis Date  . Arthritis    right shoulder  . Hypertension     Past Surgical History:  Procedure Laterality Date  . NO PAST SURGERIES    . SHOULDER ARTHROSCOPY WITH OPEN ROTATOR CUFF REPAIR Right 03/18/2016   Procedure: SHOULDER ARTHROSCOPY WITH OPEN ROTATOR CUFF REPAIR Label debridement, Decompression;  Surgeon: Christena Flake, MD;  Location: [facility];  Service: Orthopedics;  Laterality: Right;    There were no vitals filed for this visit.      Subjective Assessment - 06/12/16 2045    Subjective Pt reports she is doing well on this date. No change in symptoms. No reported pain upon arrival. Continues with restrictions in range of motion.    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   Limitations Lifting;Writing;House hold activities   Patient Stated Goals sleep in her bed comfortably without pain or restlessness, return to work   Currently in Pain? No/denies            [facility] PT Assessment - 06/12/16 0001      Strength   Right Shoulder Flexion 4-/5   Right Shoulder Extension 4/5   Right Shoulder ABduction 4-/5   Right Shoulder Internal Rotation 5/5   Right Shoulder External Rotation 5/5   Right Shoulder Horizontal ABduction  4/5   Right Shoulder Horizontal ADduction 5/5          TREATMENT  Manual Therapy Moist heat pack applied to R shoulder during history and while pt completes Quick DASH (unbilled) AAROM measurements prior to intervention: 145 flex, 120 abd, 71 ER, 55 IR; MMT performed (see flowsheet) for values; Updated goals with patent;  R shoulder posterior/inferior mobilizations at end range flexion, grade III, 30s/bout x 3 bouts; R shoulder AP mobs at end range ER, grade III, 30s/bout x 3 bouts; R shoulder mobilization with movement with belt assist at end range flexion and then abduction, grade III, 30s/bout x 3 bouts each.  AAROM after intervention: 165 flex, 145 abd, 74 ER, 60 IR; Pt instructed and issued seated passive R shoulder flexion stretch on table while sitting on rolling stool/chair as well as standing R shoulder pulley IR stretch;                      PT Education - 06/12/16 2046    Education provided Yes   Education Details HEP review and progression, plan of care   Person(s) Educated Patient   Methods Explanation;Demonstration;Verbal cues;Handout   Comprehension Verbalized understanding             PT Long Term Goals - 06/12/16 2048      PT LONG TERM GOAL #1  Title Pts QuickDash score will decrease to 50 so she can perform ADLs with less difficulty   Baseline 75, 06/11/16: 36.4   Time 8   Period Weeks   Status Achieved     PT LONG TERM GOAL #2   Title Pts ROM will be full in all directions to improve ability to perform ADLs   Baseline PROM flexion 110, scaption 120, ER 70, IR 42, 06/11/16: 165 flex, 145 abd, 74 ER, 60 IR   Time 8   Period Weeks   Status On-going     PT LONG TERM GOAL #3   Title Pt will be independent with HEP to prevent reinjury and accelerate healing process   Baseline not independent with HEP   Time 8   Period Weeks   Status On-going     PT LONG TERM GOAL #4   Title Pt will decrease quick DASH score by at least 8% in  order to demonstrate clinically significant reduction in disability.   Baseline 06/11/16: 36.4%   Time 8   Period Weeks   Status New     PT LONG TERM GOAL #5   Title Pt will improve R shoulder flexion and abduction strength to at least 4/5 in order to improve function at home and work   Baseline 06/11/16: 4-/5 flexion and abduction   Time 8   Period Weeks   Status New               Plan - 06/12/16 2046    Clinical Impression Statement Pt demonstrates improved R shoulder ROM following manual therapy during today's session. Current ROM measurements after intervention: 165 flex, 145 abd, 74 ER, 60 IR. She achieved similar gains last week but has struggled to maintain her mobility between sessions. Heavy education on the importance of stretching and ROM exercises at home to maintain range achieved during therapy session. Pt provided additional stretches today that she can perform at work. She demonstrates significant improvement in Quick DASH from 75% at initial evaluation to 36.4% today. She continues to demonstrate R shoulder weakness with flexion and abduction. Internal and external rotation are strong. Pt will benefit from additional PT services to progress ROM and increase strength in order to improve function at home and work.   Rehab Potential Good   Clinical Impairments Affecting Rehab Potential Positive: motivated, family support           Negative: medical comorbidities    PT Frequency 2x / week   PT Duration 8 weeks   PT Treatment/Interventions ADLs/Self Care Home Management;Functional mobility training;Therapeutic activities;Therapeutic exercise;Neuromuscular re-education;Patient/family education;Manual techniques;Passive range of motion;Dry needling;Scar mobilization;Iontophoresis 4mg /ml Dexamethasone;Electrical Stimulation;Moist Heat;Ultrasound   PT Next Visit Plan Continue to progress ROM with manual therapy/stretching and continue strengthening for shoulder flexion and  abduction as well as scapular stabilizers   PT Home Exercise Plan Pulleys for flexion, abduction, and IR, resisted shoulder extension, resisted low rows, seated flexion stretch    Consulted and Agree with Plan of Care Patient      Patient will benefit from skilled therapeutic intervention in order to improve the following deficits and impairments:  Decreased activity tolerance, Decreased range of motion, Decreased strength, Hypomobility, Increased muscle spasms, Impaired UE functional use, Pain, Postural dysfunction  Visit Diagnosis: Stiffness of right shoulder, not elsewhere classified - Plan: PT plan of care cert/re-cert  Muscle weakness (generalized) - Plan: PT plan of care cert/re-cert     Problem List Patient Active Problem List   Diagnosis Date Noted  .  HBP (high blood pressure) 01/17/2015  . Obesity 01/17/2015   Lynnea MaizesJason D Esperansa Sarabia PT, DPT Cristobal Advani 06/12/2016, 9:00 PM  [facility] [address] [place], [name], [address] Phone: [phone]   Fax:  [phone]  Name: Kelli Chapman MRN: [id number] Date of Birth: 04/21/1960

## 2016-06-12 ENCOUNTER — Encounter: Payer: Self-pay | Admitting: Physical Therapy

## 2016-06-12 NOTE — Patient Instructions (Signed)
Seated shoulder flexion table stretch 1 minute hold x 3, 4 times/day Standing pulley shoulder IR stretch 30s x 3, 2 times/day

## 2016-06-16 ENCOUNTER — Ambulatory Visit: Payer: 59

## 2016-06-16 DIAGNOSIS — M25511 Pain in right shoulder: Secondary | ICD-10-CM | POA: Diagnosis not present

## 2016-06-16 DIAGNOSIS — M6281 Muscle weakness (generalized): Secondary | ICD-10-CM

## 2016-06-16 DIAGNOSIS — M25611 Stiffness of right shoulder, not elsewhere classified: Secondary | ICD-10-CM

## 2016-06-16 NOTE — Therapy (Signed)
[place] [facility] [address] [place], [address] Phone: [phone]   Fax:  [phone]  Physical Therapy Treatment  Patient Details  Name: Kelli Chapman MRN: [id number] Date of Birth: [birth year] Referring Provider: Altamese Cabal  Encounter Date: 06/16/2016      PT End of Session - 06/16/16 1544    Visit Number 14   Number of Visits 33   Date for PT Re-Evaluation 08/06/16   PT Start Time 1335   PT Stop Time 1415   PT Time Calculation (min) 40 min   Activity Tolerance Patient tolerated treatment well   Behavior During Therapy [facility] for tasks assessed/performed      Past Medical History:  Diagnosis Date  . Arthritis    right shoulder  . Hypertension     Past Surgical History:  Procedure Laterality Date  . NO PAST SURGERIES    . SHOULDER ARTHROSCOPY WITH OPEN ROTATOR CUFF REPAIR Right 03/18/2016   Procedure: SHOULDER ARTHROSCOPY WITH OPEN ROTATOR CUFF REPAIR Label debridement, Decompression;  Surgeon: Christena Flake, MD;  Location: [facility];  Service: Orthopedics;  Laterality: Right;    There were no vitals filed for this visit.      Subjective Assessment - 06/16/16 1542    Subjective Pt reports that she has had increased soreness over the last couple days. She saw Dr. Joice Lofts today who reported that she will have another 4 weeks of therapy. If she is not making adequate progress he will attempt a steroid injection. If she contines to be limited after an injection he will consider manipulation.    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   Limitations Lifting;Writing;House hold activities   Patient Stated Goals sleep in her bed comfortably without pain or restlessness, return to work   Currently in Pain? No/denies                 TREATMENT   Manual Therapy  Moist heat pack applied to R shoulder during  history; R shoulder posterior/inferior mobilizations at end range flexion, grade III, 30s/bout x 3 bouts;  R shoulder AP mobs at end range ER and ER, grade III, 30s/bout x 3 bouts each;  R shoulder mobilization with movement with belt assist at end range flexion and then abduction, grade III, 30s/bout x 2 bouts each.  AAROM after intervention: 160 flex, 145 abd, 82 ER, 65 IR;   Ther-ex  UE ranger for flexion and abduction x 10 each;  Wall push up plus 2 x 10;  AROM in standing with scapular retraction for flexion and abduction x 10 each, verbal and tactile cues to avoid shoulder hiking; Wall plank on hands on mat table with alternating arm lifts for flexion x 10 bilateral; Omega rows 10# 2 x 10;                 PT Education - 06/16/16 1543    Education provided Yes   Education Details HEP reinforced, importance of stretching   Person(s) Educated Patient   Methods Explanation   Comprehension Verbalized understanding             PT Long Term Goals - 06/12/16 2048      PT LONG TERM GOAL #1   Title Pts QuickDash score will decrease to 50 so she can perform ADLs with less difficulty   Baseline 75, 06/11/16: 36.4   Time  8   Period Weeks   Status Achieved     PT LONG TERM GOAL #2   Title Pts ROM will be full in all directions to improve ability to perform ADLs   Baseline PROM flexion 110, scaption 120, ER 70, IR 42, 06/11/16: 165 flex, 145 abd, 74 ER, 60 IR   Time 8   Period Weeks   Status On-going     PT LONG TERM GOAL #3   Title Pt will be independent with HEP to prevent reinjury and accelerate healing process   Baseline not independent with HEP   Time 8   Period Weeks   Status On-going     PT LONG TERM GOAL #4   Title Pt will decrease quick DASH score by at least 8% in order to demonstrate clinically significant reduction in disability.   Baseline 06/11/16: 36.4%   Time 8   Period Weeks   Status New     PT LONG TERM GOAL #5   Title Pt will improve R  shoulder flexion and abduction strength to at least 4/5 in order to improve function at home and work   Baseline 06/11/16: 4-/5 flexion and abduction   Time 8   Period Weeks   Status New               Plan - 06/16/16 1544    Clinical Impression Statement Pt continues to demonstrate improvement in R shoulder ROM after stretching and manual therapy. Encouraged pt to perform aggressive stretching at home with increased duration as well as frequency. Will continue to progress manual techniques as well as R shoulder strengthening exercises. Pt encouraged to follow-up as scheduled.    Rehab Potential Good   Clinical Impairments Affecting Rehab Potential Positive: motivated, family support           Negative: medical comorbidities    PT Frequency 2x / week   PT Duration 8 weeks   PT Treatment/Interventions ADLs/Self Care Home Management;Functional mobility training;Therapeutic activities;Therapeutic exercise;Neuromuscular re-education;Patient/family education;Manual techniques;Passive range of motion;Dry needling;Scar mobilization;Iontophoresis 4mg /ml Dexamethasone;Electrical Stimulation;Moist Heat;Ultrasound   PT Next Visit Plan Continue to progress ROM with manual therapy/stretching and continue strengthening for shoulder flexion and abduction as well as scapular stabilizers   PT Home Exercise Plan Pulleys for flexion, abduction, and IR, resisted shoulder extension, resisted low rows, seated flexion stretch on table, supine R shoulder flexion stretch weight dumbell as able    Consulted and Agree with Plan of Care Patient      Patient will benefit from skilled therapeutic intervention in order to improve the following deficits and impairments:  Decreased activity tolerance, Decreased range of motion, Decreased strength, Hypomobility, Increased muscle spasms, Impaired UE functional use, Pain, Postural dysfunction  Visit Diagnosis: Stiffness of right shoulder, not elsewhere classified  Muscle  weakness (generalized)  Acute pain of right shoulder     Problem List Patient Active Problem List   Diagnosis Date Noted  . HBP (high blood pressure) 01/17/2015  . Obesity 01/17/2015   Lynnea MaizesJason D Huprich PT, DPT  p Huprich,Jason 06/16/2016, 5:23 PM  [facility] [address] [place], [name], [address] Phone: [phone]   Fax:  [phone]  Name: Shane CrutchMary C Eick MRN: [id number] Date of Birth: [birth year]

## 2016-06-17 ENCOUNTER — Ambulatory Visit: Payer: 59

## 2016-06-17 DIAGNOSIS — M6281 Muscle weakness (generalized): Secondary | ICD-10-CM | POA: Diagnosis not present

## 2016-06-17 DIAGNOSIS — M25511 Pain in right shoulder: Secondary | ICD-10-CM | POA: Diagnosis not present

## 2016-06-17 DIAGNOSIS — M25611 Stiffness of right shoulder, not elsewhere classified: Secondary | ICD-10-CM | POA: Diagnosis not present

## 2016-06-17 NOTE — Therapy (Signed)
[facility] PH[facility] AND SPORTS MEDICINE [address] [place], [name], [address] Phone: [phone]   Fax:  [phone]  Physical Therapy Treatment  Patient Details  Name: Kelli Chapman MRN: [id number] Date of Birth: [birth year] Referring Provider: Altamese CabalMaurice Jones  Encounter Date: 06/17/2016      Chapman End of Session - 06/17/16 1406    Visit Number 15   Number of Visits 33   Date for Chapman Re-Evaluation 08/06/16   Chapman Start Time 1347   Chapman Stop Time 1430   Chapman Time Calculation (min) 43 min   Activity Tolerance Patient tolerated treatment well   Behavior [facility] assessed/performed      Past Medical History:  Diagnosis Date  . Arthritis    right shoulder  . Hypertension     Past Surgical History:  Procedure Laterality Date  . NO PAST SURGERIES    . SHOULDER ARTHROSCOPY WITH OPEN ROTATOR CUFF REPAIR Right 03/18/2016   Procedure: SHOULDER ARTHROSCOPY WITH OPEN ROTATOR CUFF REPAIR Label debridement, Decompression;  Surgeon: Christena FlakeJohn J Poggi, MD;  Location: [facility] ORS;  Service: Orthopedics;  Laterality: Right;    There were no vitals filed for this visit.      Subjective Assessment - 06/17/16 1405    Subjective Chapman reports she is well today. No pain. No questions or concerns at this time. Denies increase in pain following therapy session yesterday.    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   Limitations Lifting;Writing;House hold activities   Patient Stated Goals sleep in her bed comfortably without pain or restlessness, return to work   Currently in Pain? No/denies        TREATMENT   Manual Therapy  Hooklying overhead stretches with 5# ankle weight on PVC pipe 15s hold x 5; R shoulder posterior/inferior mobilizations at end range flexion, grade III, 30s/bout x 3 bouts;  AAROM after intervention: 165 flexion  Ther-ex  UBE x 4 minutes (2  min forward/622min backwards) during history; Forearm wall slides x 10; Forearm wall slides with YTB around wrists for active ER and low trap lift off wall at end x 10; Wall push up plus x 10 (easy for patient); Mat table push up plus x 10 (difficulty for patient so table elevated to comfortable height);  Body blade at shoulder height for horiz abd/add, flex/ext, and abd/add at side 30s x 2 in each position first with yellow and then with black; I,Y, and T prone on mat table 1# hand weight 2 x 10 each, assist for scapular retraction; Omega rows with RUE only 15# 2 x 10;                            Chapman Education - 06/17/16 1406    Education provided Yes   Education Details HEP reinforced   Person(s) Educated Patient   Methods Explanation   Comprehension Verbalized understanding             Chapman Long Term Goals - 06/12/16 2048      Chapman LONG TERM GOAL #1   Title Pts QuickDash score will decrease to 50 so she can perform ADLs with less difficulty   Baseline 75, 06/11/16: 36.4   Time 8   Period Weeks   Status Achieved     Chapman LONG TERM GOAL #2   Title Pts ROM  will be full in all directions to improve ability to perform ADLs   Baseline PROM flexion 110, scaption 120, ER 70, IR 42, 06/11/16: 165 flex, 145 abd, 74 ER, 60 IR   Time 8   Period Weeks   Status On-going     Chapman LONG TERM GOAL #3   Title Chapman will be independent with HEP to prevent reinjury and accelerate healing process   Baseline not independent with HEP   Time 8   Period Weeks   Status On-going     Chapman LONG TERM GOAL #4   Title Chapman will decrease quick DASH score by at least 8% in order to demonstrate clinically significant reduction in disability.   Baseline 06/11/16: 36.4%   Time 8   Period Weeks   Status New     Chapman LONG TERM GOAL #5   Title Chapman will improve R shoulder flexion and abduction strength to at least 4/5 in order to improve function at home and work   Baseline 06/11/16: 4-/5 flexion and  abduction   Time 8   Period Weeks   Status New               Plan - 06/17/16 1406    Clinical Impression Statement Chapman demonstrates improvement in R RTC endurance with overhead exercises on this date. She continues to demonstrate poor scapular mobility with overhead reaching. Chapman with notable gain in ROM at end of session. No HEP progression on this date. Follow-up as scheduled.    Rehab Potential Good   Clinical Impairments Affecting Rehab Potential Positive: motivated, family support           Negative: medical comorbidities    Chapman Frequency 2x / week   Chapman Duration 8 weeks   Chapman Treatment/Interventions ADLs/Self Care Home Management;Functional mobility training;Therapeutic activities;Therapeutic exercise;Neuromuscular re-education;Patient/family education;Manual techniques;Passive range of motion;Dry needling;Scar mobilization;Iontophoresis 4mg /ml Dexamethasone;Electrical Stimulation;Moist Heat;Ultrasound   Chapman Next Visit Plan Continue to progress ROM with manual therapy/stretching and continue strengthening for shoulder flexion and abduction as well as scapular stabilizers   Chapman Home Exercise Plan Pulleys for flexion, abduction, and IR; resisted shoulder extension, resisted low rows, seated flexion stretch on table   Consulted and Agree with Plan of Care Patient      Patient will benefit from skilled therapeutic intervention in order to improve the following deficits and impairments:  Decreased activity tolerance, Decreased range of motion, Decreased strength, Hypomobility, Increased muscle spasms, Impaired UE functional use, Pain, Postural dysfunction  Visit Diagnosis: Stiffness of right shoulder, not elsewhere classified  Muscle weakness (generalized)     Problem List Patient Active Problem List   Diagnosis Date Noted  . HBP (high blood pressure) 01/17/2015  . Obesity 01/17/2015   Kelli Chapman, DPT   Kelli Chapman 06/17/2016, 5:14 PM  [place] [facility]  [facility] PHYSICAL AND SPORTS MEDICINE [address] [place], [address] Phone: [phone]   Fax:  [phone]  Name: NAYDA RIESEN MRN: [id number] Date of Birth: December 08, 1960

## 2016-06-24 ENCOUNTER — Ambulatory Visit: Payer: 59 | Admitting: Physical Therapy

## 2016-06-24 ENCOUNTER — Encounter: Payer: Self-pay | Admitting: Physical Therapy

## 2016-06-24 DIAGNOSIS — M25611 Stiffness of right shoulder, not elsewhere classified: Secondary | ICD-10-CM | POA: Diagnosis not present

## 2016-06-24 DIAGNOSIS — M25511 Pain in right shoulder: Secondary | ICD-10-CM | POA: Diagnosis not present

## 2016-06-24 DIAGNOSIS — M6281 Muscle weakness (generalized): Secondary | ICD-10-CM

## 2016-06-24 NOTE — Therapy (Signed)
[facility] [address] [place], [name], [address] Phone: [phone]   Fax:  [phone]  Physical Therapy Treatment  Patient Details  Name: Shane CrutchMary C Lintner MRN: [id number] Date of Birth: 11-26-1960 Referring Provider: Altamese CabalMaurice Jones  Encounter Date: 06/24/2016      PT End of Session - 06/24/16 0807    Visit Number 16   Number of Visits 33   Date for PT Re-Evaluation 08/06/16   PT Start Time 0802   PT Stop Time 0842   PT Time Calculation (min) 40 min   Activity Tolerance Patient tolerated treatment well   Behavior During Therapy [facility] for tasks assessed/performed      Past Medical History:  Diagnosis Date  . Arthritis    right shoulder  . Hypertension     Past Surgical History:  Procedure Laterality Date  . NO PAST SURGERIES    . SHOULDER ARTHROSCOPY WITH OPEN ROTATOR CUFF REPAIR Right 03/18/2016   Procedure: SHOULDER ARTHROSCOPY WITH OPEN ROTATOR CUFF REPAIR Label debridement, Decompression;  Surgeon: Christena FlakeJohn J Poggi, MD;  Location: [facility] ORS;  Service: Orthopedics;  Laterality: Right;    There were no vitals filed for this visit.      Subjective Assessment - 06/24/16 0805    Subjective Pt reports she is doing well with no pain this morning.  Pt does her HEP 2x/day with some pain with flexion.  She reports difficulty reaching overhead and reaching behind her back.   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   Limitations Lifting;Writing;House hold activities   Patient Stated Goals sleep in her bed comfortably without pain or restlessness, return to work   Currently in Pain? No/denies       TREATMENT   Manual Therapy  STM R middle deltoid and bicep with trigger points appreciated x605minutes.  Hooklying overhead stretches with 3# ankle weight on PVC pipe 20x hold x 5  R shoulder inferior mobilizations at end range  flexion, grade III, 45s/bout x 4 bouts  AAROM after intervention: 164 flexion   Therapeutic Exercise:  Forearm wall slides into flexion x 10 with 5 second holds  Standing R shoulder F x10 with GTB with cues to decrease UT overactivation  Standing R shoulder Abd x10 with GTB with cues to decrease UT overactivation  R shoulder at 90 deg F and rolling soccer ball x20 clockwise and x20 counterclockwise for scapular control  Mat table push up plus x 10 with cues to exaggerate plus motion.               PT Education - 06/24/16 0805    Education provided Yes   Education Details Exercise technique   Person(s) Educated Patient   Methods Explanation;Demonstration   Comprehension Verbalized understanding;Returned demonstration             PT Long Term Goals - 06/12/16 2048      PT LONG TERM GOAL #1   Title Pts QuickDash score will decrease to 50 so she can perform ADLs with less difficulty   Baseline 75, 06/11/16: 36.4   Time 8   Period Weeks   Status Achieved     PT LONG TERM GOAL #2   Title Pts ROM will be full in all directions to improve ability to perform ADLs   Baseline PROM flexion 110, scaption 120, ER 70, IR 42, 06/11/16: 165 flex, 145 abd, 74 ER,  60 IR   Time 8   Period Weeks   Status On-going     PT LONG TERM GOAL #3   Title Pt will be independent with HEP to prevent reinjury and accelerate healing process   Baseline not independent with HEP   Time 8   Period Weeks   Status On-going     PT LONG TERM GOAL #4   Title Pt will decrease quick DASH score by at least 8% in order to demonstrate clinically significant reduction in disability.   Baseline 06/11/16: 36.4%   Time 8   Period Weeks   Status New     PT LONG TERM GOAL #5   Title Pt will improve R shoulder flexion and abduction strength to at least 4/5 in order to improve function at home and work   Baseline 06/11/16: 4-/5 flexion and abduction   Time 8   Period Weeks   Status New                Plan - 06/24/16 1610    Clinical Impression Statement Pt presents with increased muscular tension in R bicep and middle deltoid regions which was address with STM to this region.  She continues to demonstrate poor scapular control with reaching overhead activities but improves with verbal and tactile cues.  Pt will benefit from continued skilled PT interventions for improved strength, scapular control, and QOL.   Rehab Potential Good   Clinical Impairments Affecting Rehab Potential Positive: motivated, family support           Negative: medical comorbidities    PT Frequency 2x / week   PT Duration 8 weeks   PT Treatment/Interventions ADLs/Self Care Home Management;Functional mobility training;Therapeutic activities;Therapeutic exercise;Neuromuscular re-education;Patient/family education;Manual techniques;Passive range of motion;Dry needling;Scar mobilization;Iontophoresis 4mg /ml Dexamethasone;Electrical Stimulation;Moist Heat;Ultrasound   PT Next Visit Plan Continue to progress ROM with manual therapy/stretching and continue strengthening for shoulder flexion and abduction as well as scapular stabilizers   PT Home Exercise Plan Pulleys for flexion, abduction, and IR; resisted shoulder extension, resisted low rows, seated flexion stretch on table   Consulted and Agree with Plan of Care Patient      Patient will benefit from skilled therapeutic intervention in order to improve the following deficits and impairments:  Decreased activity tolerance, Decreased range of motion, Decreased strength, Hypomobility, Increased muscle spasms, Impaired UE functional use, Pain, Postural dysfunction  Visit Diagnosis: Stiffness of right shoulder, not elsewhere classified  Muscle weakness (generalized)     Problem List Patient Active Problem List   Diagnosis Date Noted  . HBP (high blood pressure) 01/17/2015  . Obesity 01/17/2015    Encarnacion Chu PT, DPT 06/24/2016, 8:45 AM  [place] [facility]  [facility] PHYSICAL AND SPORTS MEDICINE [address] [place], [address] Phone: [phone]   Fax:  [phone]  Name: FRANCOISE CHOJNOWSKI MRN: [id number] Date of Birth: 05-04-1960

## 2016-06-25 ENCOUNTER — Encounter: Payer: 59 | Admitting: Physical Therapy

## 2016-07-01 ENCOUNTER — Ambulatory Visit: Payer: 59 | Attending: Orthopedic Surgery

## 2016-07-01 DIAGNOSIS — M6281 Muscle weakness (generalized): Secondary | ICD-10-CM | POA: Insufficient documentation

## 2016-07-01 DIAGNOSIS — M25511 Pain in right shoulder: Secondary | ICD-10-CM | POA: Diagnosis not present

## 2016-07-01 DIAGNOSIS — M25611 Stiffness of right shoulder, not elsewhere classified: Secondary | ICD-10-CM | POA: Insufficient documentation

## 2016-07-01 NOTE — Therapy (Signed)
[facility]ORTS MEDICINE [address], [place], [address] Phone: [phone]   Fax:  [phone]  Physical Therapy Treatment  Patient Details  Name: Kelli Chapman MRN: [id number] Date of Birth: [birth year] Referring Provider: Altamese Cabal  Encounter Date: 07/01/2016      PT End of Session - 07/01/16 0805    Visit Number 17   Number of Visits 33   Date for PT Re-Evaluation 08/06/16   PT Start Time 0800   PT Stop Time 0845   PT Time Calculation (min) 45 min   Activity Tolerance Patient tolerated treatment well   Behavior During Therapy [facility] for tasks assessed/performed      Past Medical History:  Diagnosis Date  . Arthritis    right shoulder  . Hypertension     Past Surgical History:  Procedure Laterality Date  . NO PAST SURGERIES    . SHOULDER ARTHROSCOPY WITH OPEN ROTATOR CUFF REPAIR Right 03/18/2016   Procedure: SHOULDER ARTHROSCOPY WITH OPEN ROTATOR CUFF REPAIR Label debridement, Decompression;  Surgeon: Christena Flake, MD;  Location: [facility] ORS;  Service: Orthopedics;  Laterality: Right;    There were no vitals filed for this visit.      Subjective Assessment - 07/01/16 0803    Subjective Pt denies pain at this time. Reports some stiffness in her shoulder but no pain. Performing HEP consistently. No specific questions or concerns at this time.    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   Limitations Lifting;Writing;House hold activities   Patient Stated Goals sleep in her bed comfortably without pain or restlessness, return to work   Currently in Pain? No/denies          TREATMENT   Manual Therapy  Moist heat pack applied to R shoulder x 5 minutes prior to stretches (unbilled); PROM R shoulder flexion, abduction, IR, and ER stretch x 30 seconds each, additional 2 bouts performed for IR; R shoulder posterior/inferior  mobilizations at end range flexion, grade III, 30s/bout x 2 bouts;  R shoulder AP mobilizations at 90 abduction and end range IR, grade III, 30s/bout x 2 bouts;  Hooklying overhead stretches with 3# ankle weight on PVC pipe 45s hold x 3; AAROM after intervention: 140flexion, 150 abduction, 70 IR, 70 ER  Ther-ex  UBE x 4 minutes (2 min forward/2min backwards) during history for warm-up; Pt instructed in R sidelying R shoulder sleeper IR stretch, performed 45s x 3 Y and T prone on mat table 1# hand weight 2 x 10 each, assist for scapular retraction; Mat table push up plus  x 10 (table elevated to comfortable height);  Omega rows with RUE only 15# 2 x 10; Pt issued sleeper IR stretch for HEP, provided alternative modifications for work such as standing and sitting R shoulder IR stretches Cold pack applied at end of session x 5 minutes (no charge);                      PT Education - 07/01/16 0805    Education provided Yes   Education Details HEP reinforced, added new R shoulder IR stretches   Person(s) Educated Patient   Methods Explanation;Demonstration;Verbal cues;Handout   Comprehension Verbalized understanding;Returned demonstration             PT Long Term Goals - 06/12/16 2048      PT LONG TERM GOAL #1  Title Pts QuickDash score will decrease to 50 so she can perform ADLs with less difficulty   Baseline 75, 06/11/16: 36.4   Time 8   Period Weeks   Status Achieved     PT LONG TERM GOAL #2   Title Pts ROM will be full in all directions to improve ability to perform ADLs   Baseline PROM flexion 110, scaption 120, ER 70, IR 42, 06/11/16: 165 flex, 145 abd, 74 ER, 60 IR   Time 8   Period Weeks   Status On-going     PT LONG TERM GOAL #3   Title Pt will be independent with HEP to prevent reinjury and accelerate healing process   Baseline not independent with HEP   Time 8   Period Weeks   Status On-going     PT LONG TERM GOAL #4   Title Pt will  decrease quick DASH score by at least 8% in order to demonstrate clinically significant reduction in disability.   Baseline 06/11/16: 36.4%   Time 8   Period Weeks   Status New     PT LONG TERM GOAL #5   Title Pt will improve R shoulder flexion and abduction strength to at least 4/5 in order to improve function at home and work   Baseline 06/11/16: 4-/5 flexion and abduction   Time 8   Period Weeks   Status New               Plan - 07/01/16 0805    Clinical Impression Statement Pt continues to demonstrate improved R shoulder AAROM following manual interventions. The largest gain obtained today is with R shoulder IR following mobilizations and sidelying sleeper stretch. The biggest challenge at this time is for her to mainain ROM between session. She continues to demonstrate excessive use of upper trap with overhead motions. Pt provided continual encouragement to continue HEP especially focusing on R shoulder IR stretches. Follow-up as scheduled.    Rehab Potential Good   Clinical Impairments Affecting Rehab Potential Positive: motivated, family support           Negative: medical comorbidities    PT Frequency 2x / week   PT Duration 8 weeks   PT Treatment/Interventions ADLs/Self Care Home Management;Functional mobility training;Therapeutic activities;Therapeutic exercise;Neuromuscular re-education;Patient/family education;Manual techniques;Passive range of motion;Dry needling;Scar mobilization;Iontophoresis /ml Dexamethasone;Electrical Stimulation;Moist Heat;Ultrasound   PT Next Visit Plan Continue to progress ROM with manual therapy/stretching and continue strengthening for shoulder flexion and abduction as well as scapular stabilizers   PT Home Exercise Plan Pulleys for flexion, abduction, and IR; resisted shoulder extension, resisted low rows, seated flexion stretch on table, R sideling R shoulder IR sleeper stretch (can perform IR towel stretch or sitting IR stretch at home)    Consulted and Agree with Plan of Care Patient      Patient will benefit from skilled therapeutic intervention in order to improve the following deficits and impairments:  Decreased activity tolerance, Decreased range of motion, Decreased strength, Hypomobility, Increased muscle spasms, Impaired UE functional use, Pain, Postural dysfunction  Visit Diagnosis: Stiffness of right shoulder, not elsewhere classified  Muscle weakness (generalized)     Problem List Patient Active Problem List   Diagnosis Date Noted  . HBP (high blood pressure) 01/17/2015  . Obesity 01/17/2015   Lynnea Maizes PT, DPT   Marialice Newkirk 07/01/2016, 9:21 AM  [place] [facility] PHYSICAL AND SPORTS MEDICINE [address], [place], [address] Phone: [phone]   Fax:  [phone]  Name: Kelli Chapman MRN: [id number] Date of Birth: [birth year]

## 2016-07-02 ENCOUNTER — Ambulatory Visit: Payer: 59

## 2016-07-08 ENCOUNTER — Encounter: Payer: 59 | Admitting: Physical Therapy

## 2016-07-09 ENCOUNTER — Ambulatory Visit: Payer: 59

## 2016-07-09 DIAGNOSIS — M25511 Pain in right shoulder: Secondary | ICD-10-CM | POA: Diagnosis not present

## 2016-07-09 DIAGNOSIS — M25611 Stiffness of right shoulder, not elsewhere classified: Secondary | ICD-10-CM

## 2016-07-09 DIAGNOSIS — M6281 Muscle weakness (generalized): Secondary | ICD-10-CM | POA: Diagnosis not present

## 2016-07-09 NOTE — Therapy (Signed)
[facility] PH[facility]ORTS MEDICINE [address] [place], [address] Phone: [phone]   Fax:  [phone]  Physical Therapy Treatment  Patient Details  Name: Kelli Chapman MRN: [id number] Date of Birth: 01-25-1961 Referring Provider: Altamese Cabal  Encounter Date: 07/09/2016      PT End of Session - 07/09/16 1409    Visit Number 18   Number of Visits 33   Date for PT Re-Evaluation 08/06/16   PT Start Time 1400   PT Stop Time 1450   PT Time Calculation (min) 50 min   Activity Tolerance Patient tolerated treatment well   Behavior During Therapy [facility] for tasks assessed/performed      Past Medical History:  Diagnosis Date  . Arthritis    right shoulder  . Hypertension     Past Surgical History:  Procedure Laterality Date  . NO PAST SURGERIES    . SHOULDER ARTHROSCOPY WITH OPEN ROTATOR CUFF REPAIR Right 03/18/2016   Procedure: SHOULDER ARTHROSCOPY WITH OPEN ROTATOR CUFF REPAIR Label debridement, Decompression;  Surgeon: Christena Flake, MD;  Location: [facility] ORS;  Service: Orthopedics;  Laterality: Right;    There were no vitals filed for this visit.      Subjective Assessment - 07/09/16 1409    Subjective Pt denies pain at this time. Reports continued night pain. Performing HEP consistently. No specific questions or concerns at this time.    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   Limitations Lifting;Writing;House hold activities   Patient Stated Goals sleep in her bed comfortably without pain or restlessness, return to work   Currently in Pain? No/denies         TREATMENT   Manual Therapy  Moist heat pack applied to R shoulder x 5 minutes prior to stretches (unbilled); PROM R shoulder flexion, abduction, IR, and ER stretch x 30 seconds each; R shoulder AP mobilizations at 90 abduction and end range ER and IR, grade III, 30s/bout  x 3 bouts each;   Ther-ex  UBE x 4 minutes (2 min forward/49min backwards) during history for warm-up; R sidelying R shoulder sleeper IR stretch 30s x 3; Mat table push up plus 2 x 10 (table elevated to comfortable height);  R shoulder flexion wall slide with lift off at end range flexion with YTB around wrists 2 x 10; R shoulder CW/CCW circles at shoulder height forward and in abduction x 20 each; Body blade (black) flexion, abduction, and overhead x 30s each; Cold pack applied at end of session x 5 minutes (no charge);                         PT Education - 07/09/16 1409    Education provided Yes   Education Details HEP reinforced   Person(s) Educated Patient   Methods Explanation   Comprehension Verbalized understanding             PT Long Term Goals - 06/12/16 2048      PT LONG TERM GOAL #1   Title Pts QuickDash score will decrease to 50 so she can perform ADLs with less difficulty   Baseline 75, 06/11/16: 36.4   Time 8   Period Weeks   Status Achieved     PT LONG TERM GOAL #2   Title Pts ROM will be full in all directions to improve ability to perform ADLs  Baseline PROM flexion 110, scaption 120, ER 70, IR 42, 06/11/16: 165 flex, 145 abd, 74 ER, 60 IR   Time 8   Period Weeks   Status On-going     PT LONG TERM GOAL #3   Title Pt will be independent with HEP to prevent reinjury and accelerate healing process   Baseline not independent with HEP   Time 8   Period Weeks   Status On-going     PT LONG TERM GOAL #4   Title Pt will decrease quick DASH score by at least 8% in order to demonstrate clinically significant reduction in disability.   Baseline 06/11/16: 36.4%   Time 8   Period Weeks   Status New     PT LONG TERM GOAL #5   Title Pt will improve R shoulder flexion and abduction strength to at least 4/5 in order to improve function at home and work   Baseline 06/11/16: 4-/5 flexion and abduction   Time 8   Period Weeks   Status New                Plan - 07/09/16 1410    Clinical Impression Statement Pt demonstrates good progress with therapy. She continues to be most limited in R shoulder internal rotation. Continue current HEP with focus on IR stretches. Follow-up as scheduled.    Rehab Potential Good   Clinical Impairments Affecting Rehab Potential Positive: motivated, family support           Negative: medical comorbidities    PT Frequency 2x / week   PT Duration 8 weeks   PT Treatment/Interventions ADLs/Self Care Home Management;Functional mobility training;Therapeutic activities;Therapeutic exercise;Neuromuscular re-education;Patient/family education;Manual techniques;Passive range of motion;Dry needling;Scar mobilization;Iontophoresis /ml Dexamethasone;Electrical Stimulation;Moist Heat;Ultrasound   PT Next Visit Plan Continue to progress ROM with manual therapy/stretching and continue strengthening for shoulder flexion and abduction as well as scapular stabilizers   PT Home Exercise Plan Pulleys for flexion, abduction, and IR; resisted shoulder extension, resisted low rows, seated flexion stretch on table, R sideling R shoulder IR sleeper stretch (can perform IR towel stretch or sitting IR stretch at home)   Consulted and Agree with Plan of Care Patient      Patient will benefit from skilled therapeutic intervention in order to improve the following deficits and impairments:  Decreased activity tolerance, Decreased range of motion, Decreased strength, Hypomobility, Increased muscle spasms, Impaired UE functional use, Pain, Postural dysfunction  Visit Diagnosis: Stiffness of right shoulder, not elsewhere classified  Muscle weakness (generalized)  Acute pain of right shoulder     Problem List Patient Active Problem List   Diagnosis Date Noted  . HBP (high blood pressure) 01/17/2015  . Obesity 01/17/2015   Lynnea Maizes PT, DPT   Corwyn Vora 07/10/2016, 8:01 AM  [facility]  [facility] PHYSICAL AND SPORTS MEDICINE [address] [place], [address] Phone: [phone]   Fax:  [phone]  Name: Kelli Chapman MRN: [id number] Date of Birth: [birth year]

## 2016-07-14 ENCOUNTER — Ambulatory Visit: Payer: 59

## 2016-07-16 ENCOUNTER — Ambulatory Visit: Payer: 59 | Admitting: Physical Therapy

## 2016-07-21 ENCOUNTER — Ambulatory Visit: Payer: 59

## 2016-07-21 DIAGNOSIS — M7581 Other shoulder lesions, right shoulder: Secondary | ICD-10-CM | POA: Diagnosis not present

## 2016-07-21 DIAGNOSIS — M75111 Incomplete rotator cuff tear or rupture of right shoulder, not specified as traumatic: Secondary | ICD-10-CM | POA: Diagnosis not present

## 2016-07-21 DIAGNOSIS — M24111 Other articular cartilage disorders, right shoulder: Secondary | ICD-10-CM | POA: Diagnosis not present

## 2016-07-23 ENCOUNTER — Ambulatory Visit: Payer: 59

## 2016-07-23 DIAGNOSIS — M6281 Muscle weakness (generalized): Secondary | ICD-10-CM | POA: Diagnosis not present

## 2016-07-23 DIAGNOSIS — M25611 Stiffness of right shoulder, not elsewhere classified: Secondary | ICD-10-CM | POA: Diagnosis not present

## 2016-07-23 DIAGNOSIS — M25511 Pain in right shoulder: Secondary | ICD-10-CM

## 2016-07-23 NOTE — Therapy (Signed)
[place] [facility]ORTS MEDICINE [address] [place], [address] Phone: [phone]   Fax:  [phone]  Physical Therapy Treatment  Patient Details  Name: Kelli Chapman MRN: [id number] Date of Birth: [birth year] Referring Provider: Altamese Cabal  Encounter Date: 07/23/2016      PT End of Session - 07/23/16 1433    Visit Number 19   Number of Visits 33   Date for PT Re-Evaluation 08/06/16   PT Start Time 1345   PT Stop Time 1430   PT Time Calculation (min) 45 min   Activity Tolerance Patient tolerated treatment well   Behavior During Therapy [facility] for tasks assessed/performed      Past Medical History:  Diagnosis Date  . Arthritis    right shoulder  . Hypertension     Past Surgical History:  Procedure Laterality Date  . NO PAST SURGERIES    . SHOULDER ARTHROSCOPY WITH OPEN ROTATOR CUFF REPAIR Right 03/18/2016   Procedure: SHOULDER ARTHROSCOPY WITH OPEN ROTATOR CUFF REPAIR Label debridement, Decompression;  Surgeon: Christena Flake, MD;  Location: [facility] ORS;  Service: Orthopedics;  Laterality: Right;    There were no vitals filed for this visit.      Subjective Assessment - 07/23/16 1432    Subjective Patient reports increased R shoulder pain currently states it's been worse since the inclimate weather.    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   Limitations Lifting;Writing;House hold activities   Patient Stated Goals sleep in her bed comfortably without pain or restlessness, return to work   Currently in Pain? Yes   Pain Score 5    Pain Location Shoulder   Pain Orientation Right   Pain Descriptors / Indicators Sore;Aching   Pain Type Surgical pain      TREATMENT    Manual Therapy  Long axis distraction on the R shoulder in supine - 45sec x3  R shoulder AP, Inferior mobilizations at 90 abduction and end range ER and IR, grade  III, 30s/bout x 3 bouts each;    Ther-ex  UBE x 4 minutes (2 min forward/8min backwards) during history for warm-up; Scapular retraction in sitting at OMEGA - 20# x20 Straight arm push at OMEGA - x20 20# Standing scapular retraction unilateral at OMEGA - x20 15# Seated High row at [facility] - x30 20# Standing shoulder flexion with yellow physioball  Push up with PLUS against wall - x20         PT Education - 07/23/16 1433    Education provided Yes   Education Details Anatomy of shoulder musculature   Person(s) Educated Patient   Methods Explanation;Demonstration   Comprehension Returned demonstration             PT Long Term Goals - 06/12/16 2048      PT LONG TERM GOAL #1   Title Pts QuickDash score will decrease to 50 so she can perform ADLs with less difficulty   Baseline 75, 06/11/16: 36.4   Time 8   Period Weeks   Status Achieved     PT LONG TERM GOAL #2   Title Pts ROM will be full in all directions to improve ability to perform ADLs   Baseline PROM flexion 110, scaption 120, ER 70, IR 42, 06/11/16: 165 flex, 145 abd, 74 ER, 60 IR   Time 8   Period Weeks   Status On-going  PT LONG TERM GOAL #3   Title Pt will be independent with HEP to prevent reinjury and accelerate healing process   Baseline not independent with HEP   Time 8   Period Weeks   Status On-going     PT LONG TERM GOAL #4   Title Pt will decrease quick DASH score by at least 8% in order to demonstrate clinically significant reduction in disability.   Baseline 06/11/16: 36.4%   Time 8   Period Weeks   Status New     PT LONG TERM GOAL #5   Title Pt will improve R shoulder flexion and abduction strength to at least 4/5 in order to improve function at home and work   Baseline 06/11/16: 4-/5 flexion and abduction   Time 8   Period Weeks   Status New               Plan - 07/23/16 1434    Clinical Impression Statement Focused on decreasing shoulder pain today as patient reports  increased pain and stiffness from the weather. Patient demonstrates decreased muscular endurance with lower trap exercises demonstrating increased fatigue after performance of exercise and will benefit from further skilled therapy to return to prior level of function.    Rehab Potential Good   Clinical Impairments Affecting Rehab Potential Positive: motivated, family support           Negative: medical comorbidities    PT Frequency 2x / week   PT Duration 8 weeks   PT Treatment/Interventions ADLs/Self Care Home Management;Functional mobility training;Therapeutic activities;Therapeutic exercise;Neuromuscular re-education;Patient/family education;Manual techniques;Passive range of motion;Dry needling;Scar mobilization;Iontophoresis /ml Dexamethasone;Electrical Stimulation;Moist Heat;Ultrasound   PT Next Visit Plan Continue to progress ROM with manual therapy/stretching and continue strengthening for shoulder flexion and abduction as well as scapular stabilizers   PT Home Exercise Plan Pulleys for flexion, abduction, and IR; resisted shoulder extension, resisted low rows, seated flexion stretch on table, R sideling R shoulder IR sleeper stretch (can perform IR towel stretch or sitting IR stretch at home)   Consulted and Agree with Plan of Care Patient      Patient will benefit from skilled therapeutic intervention in order to improve the following deficits and impairments:  Decreased activity tolerance, Decreased range of motion, Decreased strength, Hypomobility, Increased muscle spasms, Impaired UE functional use, Pain, Postural dysfunction  Visit Diagnosis: Stiffness of right shoulder, not elsewhere classified  Muscle weakness (generalized)  Acute pain of right shoulder     Problem List Patient Active Problem List   Diagnosis Date Noted  . HBP (high blood pressure) 01/17/2015  . Obesity 01/17/2015    Myrene Galas, PT DPT 07/23/2016, 2:37 PM  [facility] PHYSICAL AND SPORTS MEDICINE [address] [place], [address] Phone: [phone]   Fax:  [phone]  Name: CORLIS ANGELICA MRN: [id number] Date of Birth: 1960-05-08

## 2016-07-28 ENCOUNTER — Ambulatory Visit: Payer: 59

## 2016-07-30 ENCOUNTER — Ambulatory Visit: Payer: 59 | Attending: Orthopedic Surgery

## 2016-07-30 DIAGNOSIS — M25511 Pain in right shoulder: Secondary | ICD-10-CM | POA: Insufficient documentation

## 2016-07-30 DIAGNOSIS — M6281 Muscle weakness (generalized): Secondary | ICD-10-CM | POA: Diagnosis not present

## 2016-07-30 DIAGNOSIS — M25611 Stiffness of right shoulder, not elsewhere classified: Secondary | ICD-10-CM | POA: Insufficient documentation

## 2016-07-30 NOTE — Therapy (Signed)
[place] [facility] PHYSICAL [facility]ORTS MEDICINE [address] [place], [address] Phone: [phone]   Fax:  [phone]  Physical Therapy Treatment  Patient Details  Name: Kelli Chapman MRN: [id number] Date of Birth: 18-Feb-1961 Referring Provider: Altamese Cabal  Encounter Date: 07/30/2016      PT End of Session - 07/30/16 1315    Visit Number 20   Number of Visits 33   Date for PT Re-Evaluation 08/06/16   PT Start Time 1300   PT Stop Time 1345   PT Time Calculation (min) 45 min   Activity Tolerance Patient tolerated treatment well   Behavior During Therapy [facility] for tasks assessed/performed      Past Medical History:  Diagnosis Date  . Arthritis    right shoulder  . Hypertension     Past Surgical History:  Procedure Laterality Date  . NO PAST SURGERIES    . SHOULDER ARTHROSCOPY WITH OPEN ROTATOR CUFF REPAIR Right 03/18/2016   Procedure: SHOULDER ARTHROSCOPY WITH OPEN ROTATOR CUFF REPAIR Label debridement, Decompression;  Surgeon: Christena Flake, MD;  Location: [facility] ORS;  Service: Orthopedics;  Laterality: Right;    There were no vitals filed for this visit.      Subjective Assessment - 07/30/16 1303    Subjective Patient reports no current R shoulder pain but still continues to have difficulty with reaching her behind her back .    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   Limitations Lifting;Writing;House hold activities   Patient Stated Goals sleep in her bed comfortably without pain or restlessness, return to work   Currently in Pain? No/denies        TREATMENT    Manual Therapy  Long axis distraction on the R shoulder in supine - 45sec x3  R shoulder AP, Inferior mobilizations at 90 abduction and end range ER and IR, grade III, 30s/bout x 3 bouts each; Arm in horizontal add - A->P 3 x 30sec    Ther-ex   Chest press with 4# each arm - x  20 Serratus punches in supine - x 20 Scapular retraction in sitting at OMEGA - 25# x20 Seated High row at OMEGA - x20 35# Serratus Punches in sitting with yellow band - 2 x 20  Standing shoulder flexion with yellow band - 2 x 20 Standing shoulder IR at OMEGA - 2 x 20 #10  Wall angels against wall in standing - x20  Reaching behind back with towel - x 5 with 3 sec holds.        PT Education - 07/30/16 1314    Education provided Yes   Education Details Form/technique with exercise   Person(s) Educated Patient   Methods Explanation;Demonstration   Comprehension Verbalized understanding;Returned demonstration             PT Long Term Goals - 06/12/16 2048      PT LONG TERM GOAL #1   Title Pts QuickDash score will decrease to 50 so she can perform ADLs with less difficulty   Baseline 75, 06/11/16: 36.4   Time 8   Period Weeks   Status Achieved     PT LONG TERM GOAL #2   Title Pts ROM will be full in all directions to improve ability to perform ADLs   Baseline PROM flexion 110, scaption 120, ER 70, IR 42, 06/11/16: 165 flex, 145 abd, 74 ER, 60 IR  Time 8   Period Weeks   Status On-going     PT LONG TERM GOAL #3   Title Pt will be independent with HEP to prevent reinjury and accelerate healing process   Baseline not independent with HEP   Time 8   Period Weeks   Status On-going     PT LONG TERM GOAL #4   Title Pt will decrease quick DASH score by at least 8% in order to demonstrate clinically significant reduction in disability.   Baseline 06/11/16: 36.4%   Time 8   Period Weeks   Status New     PT LONG TERM GOAL #5   Title Pt will improve R shoulder flexion and abduction strength to at least 4/5 in order to improve function at home and work   Baseline 06/11/16: 4-/5 flexion and abduction   Time 8   Period Weeks   Status New               Plan - 07/30/16 1315    Clinical Impression Statement Focused on improving shoulder stabilization and ability to  reach behind her back to improve ability to wash her back. Patient demonstrates improved ability to reach behind  her back by the end of session. Patient continues to demonstrate decreased coordination and endurance with exercises; patient will benefit from further skilled therapy to return to prior level of function.    Rehab Potential Good   Clinical Impairments Affecting Rehab Potential Positive: motivated, family support           Negative: medical comorbidities    PT Frequency 2x / week   PT Duration 8 weeks   PT Treatment/Interventions ADLs/Self Care Home Management;Functional mobility training;Therapeutic activities;Therapeutic exercise;Neuromuscular re-education;Patient/family education;Manual techniques;Passive range of motion;Dry needling;Scar mobilization;Iontophoresis /ml Dexamethasone;Electrical Stimulation;Moist Heat;Ultrasound   PT Next Visit Plan Continue to progress ROM with manual therapy/stretching and continue strengthening for shoulder flexion and abduction as well as scapular stabilizers   PT Home Exercise Plan Pulleys for flexion, abduction, and IR; resisted shoulder extension, resisted low rows, seated flexion stretch on table, R sideling R shoulder IR sleeper stretch (can perform IR towel stretch or sitting IR stretch at home)   Consulted and Agree with Plan of Care Patient      Patient will benefit from skilled therapeutic intervention in order to improve the following deficits and impairments:  Decreased activity tolerance, Decreased range of motion, Decreased strength, Hypomobility, Increased muscle spasms, Impaired UE functional use, Pain, Postural dysfunction  Visit Diagnosis: Stiffness of right shoulder, not elsewhere classified  Muscle weakness (generalized)  Acute pain of right shoulder     Problem List Patient Active Problem List   Diagnosis Date Noted  . HBP (high blood pressure) 01/17/2015  . Obesity 01/17/2015    Myrene Galas, PT DPT 07/30/2016,  2:01 PM  [place] [facility] PHYSICAL AND SPORTS MEDICINE [address], [place], [address] Phone: [phone]   Fax:  [phone]  Name: Kelli Chapman MRN: [id number] Date of Birth: 16-Aug-1960

## 2016-08-04 ENCOUNTER — Ambulatory Visit: Payer: 59

## 2016-08-06 ENCOUNTER — Ambulatory Visit: Payer: 59

## 2016-08-06 DIAGNOSIS — M6281 Muscle weakness (generalized): Secondary | ICD-10-CM | POA: Diagnosis not present

## 2016-08-06 DIAGNOSIS — M25611 Stiffness of right shoulder, not elsewhere classified: Secondary | ICD-10-CM

## 2016-08-06 DIAGNOSIS — M25511 Pain in right shoulder: Secondary | ICD-10-CM | POA: Diagnosis not present

## 2016-08-06 NOTE — Therapy (Signed)
[facility] PHYSICAL AND SPORTS MEDICINE [address] [place], [name], [id number] Phone: [phone]   Fax:  [phone]  Physical Therapy Treatment  Patient Details  Name: Kelli Chapman MRN: [id number] Date of Birth: 1960/08/10 Referring Provider: Altamese CabalMaurice Jones  Encounter Date: 08/06/2016      PT End of Session - 08/06/16 1600    Visit Number 21   Number of Visits 33   Date for PT Re-Evaluation 08/06/16   PT Start Time 1530   PT Stop Time 1615   PT Time Calculation (min) 45 min   Activity Tolerance Patient tolerated treatment well   Behavior [facility] for tasks assessed/performed      Past Medical History:  Diagnosis Date  . Arthritis    right shoulder  . Hypertension     Past Surgical History:  Procedure Laterality Date  . NO PAST SURGERIES    . SHOULDER ARTHROSCOPY WITH OPEN ROTATOR CUFF REPAIR Right 03/18/2016   Procedure: SHOULDER ARTHROSCOPY WITH OPEN ROTATOR CUFF REPAIR Label debridement, Decompression;  Surgeon: Christena FlakeJohn J Poggi, MD;  Location: [facility] ORS;  Service: Orthopedics;  Laterality: Right;    There were no vitals filed for this visit.      Subjective Assessment - 08/06/16 1558    Subjective Patient reports no current R shoulder pain and states she's been able to reach behind her back with increased ease.   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   Limitations Lifting;Writing;House hold activities   Patient Stated Goals sleep in her bed comfortably without pain or restlessness, return to work   Currently in Pain? No/denies           TREATMENT    Manual Therapy  Long axis distraction on the R shoulder in supine - 45sec x3  R shoulder AP, Inferior mobilizations at 90 abduction and end range ER and IR, grade III, 30s/bout x 3 bouts each; Arm in horizontal add - A->P 3 x 30sec    Ther-ex    Chest press with 10# each arm  into serratus punches - x 20 Scapular retraction in sitting at [facility] - 35# 3 x 10 [facility]ated [facility] - [facility] x 10 35# Reaching behind back with towel - x 10 with 3 sec holds. Standing shoulder IR at [facility] - 4 x 10  #10 Ball circles against the wall -  x 60sec  Straight arm ball taps on the wall - x 45 sec   Patient demonstrates improvement with shoulder mobility after performing ther-ex       PT Education - 08/06/16 1600    Education provided Yes   Education Details Form/technique with exercise   Person(s) Educated Patient   Methods Explanation;Demonstration   Comprehension Verbalized understanding;Returned demonstration             PT Long Term Goals - 06/12/16 2048      PT LONG TERM GOAL #1   Title Pts QuickDash score will decrease to 50 so she can perform ADLs with less difficulty   Baseline 75, 06/11/16: 36.4   Time 8   Period Weeks   Status Achieved     PT LONG TERM GOAL #2   Title Pts ROM will be full in all directions to improve ability to perform ADLs   Baseline PROM flexion 110, scaption 120, ER 70, IR 42, 06/11/16: 165 flex, 145 abd, 74 ER, 60  IR   Time 8   Period Weeks   Status On-going     PT LONG TERM GOAL #3   Title Pt will be independent with HEP to prevent reinjury and accelerate healing process   Baseline not independent with HEP   Time 8   Period Weeks   Status On-going     PT LONG TERM GOAL #4   Title Pt will decrease quick DASH score by at least 8% in order to demonstrate clinically significant reduction in disability.   Baseline 06/11/16: 36.4%   Time 8   Period Weeks   Status New     PT LONG TERM GOAL #5   Title Pt will improve R shoulder flexion and abduction strength to at least 4/5 in order to improve function at home and work   Baseline 06/11/16: 4-/5 flexion and abduction   Time 8   Period Weeks   Status New               Plan - 08/06/16 1805    Clinical Impression Statement Patient demonstrates improvement with  shoulder AROM after performing exercises indicating improved coordination and shoulder AROM. Patient demonstrates improvement in shoulder strength as indicated by ability to perform exercises with greater amount of resistance and patient will benefit from further skilled therapy to return to prior level of function.    Rehab Potential Good   Clinical Impairments Affecting Rehab Potential Positive: motivated, family support           Negative: medical comorbidities    PT Frequency 2x / week   PT Duration 8 weeks   PT Treatment/Interventions ADLs/Self Care Home Management;Functional mobility training;Therapeutic activities;Therapeutic exercise;Neuromuscular re-education;Patient/family education;Manual techniques;Passive range of motion;Dry needling;Scar mobilization;Iontophoresis 4mg /ml Dexamethasone;Electrical Stimulation;Moist Heat;Ultrasound   PT Next Visit Plan Continue to progress ROM with manual therapy/stretching and continue strengthening for shoulder flexion and abduction as well as scapular stabilizers   PT Home Exercise Plan Pulleys for flexion, abduction, and IR; resisted shoulder extension, resisted low rows, seated flexion stretch on table, R sideling R shoulder IR sleeper stretch (can perform IR towel stretch or sitting IR stretch at home)   Consulted and Agree with Plan of Care Patient      Patient will benefit from skilled therapeutic intervention in order to improve the following deficits and impairments:  Decreased activity tolerance, Decreased range of motion, Decreased strength, Hypomobility, Increased muscle spasms, Impaired UE functional use, Pain, Postural dysfunction  Visit Diagnosis: Stiffness of right shoulder, not elsewhere classified  Muscle weakness (generalized)  Acute pain of right shoulder     Problem List Patient Active Problem List   Diagnosis Date Noted  . HBP (high blood pressure) 01/17/2015  . Obesity 01/17/2015    Myrene Galas, PT DPT 08/06/2016,  6:08 PM  [facility] PHYSICAL AND SPORTS MEDICINE [address], [place], [address] Phone: [phone]   Fax:  [phone]  Name: Kelli Chapman MRN: [id number] Date of Birth: 1961/03/04

## 2016-08-13 ENCOUNTER — Ambulatory Visit: Payer: 59

## 2016-08-22 DIAGNOSIS — M7581 Other shoulder lesions, right shoulder: Secondary | ICD-10-CM | POA: Diagnosis not present

## 2016-08-22 DIAGNOSIS — M24111 Other articular cartilage disorders, right shoulder: Secondary | ICD-10-CM | POA: Diagnosis not present

## 2016-08-22 DIAGNOSIS — M75111 Incomplete rotator cuff tear or rupture of right shoulder, not specified as traumatic: Secondary | ICD-10-CM | POA: Diagnosis not present

## 2016-11-03 DIAGNOSIS — M19011 Primary osteoarthritis, right shoulder: Secondary | ICD-10-CM | POA: Insufficient documentation

## 2016-11-03 DIAGNOSIS — M24111 Other articular cartilage disorders, right shoulder: Secondary | ICD-10-CM | POA: Diagnosis not present

## 2016-11-03 DIAGNOSIS — M7581 Other shoulder lesions, right shoulder: Secondary | ICD-10-CM | POA: Diagnosis not present

## 2016-11-03 DIAGNOSIS — M75111 Incomplete rotator cuff tear or rupture of right shoulder, not specified as traumatic: Secondary | ICD-10-CM | POA: Diagnosis not present

## 2017-01-05 DIAGNOSIS — M24111 Other articular cartilage disorders, right shoulder: Secondary | ICD-10-CM | POA: Diagnosis not present

## 2017-01-05 DIAGNOSIS — M7581 Other shoulder lesions, right shoulder: Secondary | ICD-10-CM | POA: Diagnosis not present

## 2017-01-05 DIAGNOSIS — M75111 Incomplete rotator cuff tear or rupture of right shoulder, not specified as traumatic: Secondary | ICD-10-CM | POA: Diagnosis not present

## 2017-01-05 DIAGNOSIS — M19011 Primary osteoarthritis, right shoulder: Secondary | ICD-10-CM | POA: Diagnosis not present

## 2017-01-06 DIAGNOSIS — H8110 Benign paroxysmal vertigo, unspecified ear: Secondary | ICD-10-CM | POA: Diagnosis not present

## 2017-01-28 DIAGNOSIS — H5203 Hypermetropia, bilateral: Secondary | ICD-10-CM | POA: Diagnosis not present

## 2017-01-28 DIAGNOSIS — H52223 Regular astigmatism, bilateral: Secondary | ICD-10-CM | POA: Diagnosis not present

## 2017-01-28 DIAGNOSIS — H524 Presbyopia: Secondary | ICD-10-CM | POA: Diagnosis not present

## 2017-05-08 ENCOUNTER — Ambulatory Visit: Payer: 59 | Admitting: Nurse Practitioner

## 2017-07-29 ENCOUNTER — Ambulatory Visit (INDEPENDENT_AMBULATORY_CARE_PROVIDER_SITE_OTHER): Payer: No Typology Code available for payment source | Admitting: Nurse Practitioner

## 2017-07-29 ENCOUNTER — Other Ambulatory Visit: Payer: Self-pay

## 2017-07-29 ENCOUNTER — Encounter: Payer: Self-pay | Admitting: Nurse Practitioner

## 2017-07-29 VITALS — BP 152/87 | HR 73 | Temp 98.1°F | Ht 64.0 in | Wt 235.2 lb

## 2017-07-29 DIAGNOSIS — Z87898 Personal history of other specified conditions: Secondary | ICD-10-CM

## 2017-07-29 DIAGNOSIS — I10 Essential (primary) hypertension: Secondary | ICD-10-CM

## 2017-07-29 MED ORDER — LISINOPRIL 40 MG PO TABS: 40.0000 mg | ORAL_TABLET | Freq: Every day | ORAL | 1 refills | Status: DC

## 2017-07-29 MED ORDER — SCOPOLAMINE 1 MG/3DAYS TD PT72: 1.0000 | MEDICATED_PATCH | TRANSDERMAL | 0 refills | Status: DC

## 2017-07-29 MED ORDER — HYDROCHLOROTHIAZIDE 25 MG PO TABS
25.0000 mg | ORAL_TABLET | Freq: Every day | ORAL | 5 refills | Status: DC
Start: 2017-07-29 — End: 2017-09-02

## 2017-07-29 MED ORDER — AMLODIPINE BESYLATE 5 MG PO TABS: 5.0000 mg | ORAL_TABLET | Freq: Every day | ORAL | 1 refills | Status: DC

## 2017-07-29 NOTE — Assessment & Plan Note (Signed)
Currently uncontrolled hypertension off medications x 5 days, but had similarly elevated BP readings when checked at work in past.  BP goal < 130/80.  Pt is not currently working on lifestyle modifications.  Taking medications tolerating well without side effects. No currently identified complications.  Plan: 1. Continue taking lisinopril 40 mg and amlodipine 5 mg once daily - START hydrochlorothiazide 25 mg once daily.  If reaching goal, reduce lisinopril and consider combination pill. 2. Obtain labs CMP today  3. Encouraged heart healthy diet and increasing exercise to 30 minutes most days of the week. 4. Check BP 1-2 x per week at home, keep log, and bring to clinic at next appointment. 5. Follow up 4 weeks.

## 2017-07-29 NOTE — Patient Instructions (Addendum)
Kelli Chapman,   Thank you for coming in to clinic today.  1. BP goal < 130/80. - Continue taking lisinopril and amlodipine without changes - START hydrochlorothiazide 25 mg once daily.  Lab results will be released to MyChart once they return.  - Eat a heart healthy diet.  Increase your physical activity until you are increasing your heart rate for 30 minutes on most days of the week.  Check BP 2-3 x per week at home or work, keep log, and bring to clinic at next appointment.   Please schedule a follow-up appointment with Wilhelmina Mcardle, AGNP. Return in about 1 month (around 08/26/2017) for hypertension.  If you have any other questions or concerns, please feel free to call the clinic or send a message through MyChart. You may also schedule an earlier appointment if necessary.  You will receive a survey after today's visit either digitally by e-mail or paper by [employer]. Your experiences and feedback matter to [place].  Please respond so we know how we are doing as we provide care for you.   Wilhelmina Mcardle, DNP, AGNP-BC Adult Gerontology Nurse Practitioner [facility], [facility]   Managing Your Hypertension Hypertension is commonly called high blood pressure. This is when the force of your blood pressing against the walls of your arteries is too strong. Arteries are blood vessels that carry blood from your heart throughout your body. Hypertension forces the heart to work harder to pump blood, and may cause the arteries to become narrow or stiff. Having untreated or uncontrolled hypertension can cause heart attack, stroke, kidney disease, and other problems. What are blood pressure readings? A blood pressure reading consists of a higher number over a lower number. Ideally, your blood pressure should be below 120/80. The first ("top") number is called the systolic pressure. It is a measure of the pressure in your arteries as your heart beats. The second ("bottom") number is called  the diastolic pressure. It is a measure of the pressure in your arteries as the heart relaxes. What does my blood pressure reading mean? Blood pressure is classified into four stages. Based on your blood pressure reading, your health care provider may use the following stages to determine what type of treatment you need, if any. Systolic pressure and diastolic pressure are measured in a unit called mm Hg. Normal  Systolic pressure: below 120.  Diastolic pressure: below 80. Elevated  Systolic pressure: 120-129.  Diastolic pressure: below 80. Hypertension stage 1  Systolic pressure: 130-139.  Diastolic pressure: 80-89. Hypertension stage 2  Systolic pressure: 140 or above.  Diastolic pressure: 90 or above. What health risks are associated with hypertension? Managing your hypertension is an important responsibility. Uncontrolled hypertension can lead to:  A heart attack.  A stroke.  A weakened blood vessel (aneurysm).  Heart failure.  Kidney damage.  Eye damage.  Metabolic syndrome.  Memory and concentration problems.  What changes can I make to manage my hypertension? Hypertension can be managed by making lifestyle changes and possibly by taking medicines. Your health care provider will help you make a plan to bring your blood pressure within a normal range. Eating and drinking  Eat a diet that is high in fiber and potassium, and low in salt (sodium), added sugar, and fat. An example eating plan is called the DASH (Dietary Approaches to Stop Hypertension) diet. To eat this way: ? Eat plenty of fresh fruits and vegetables. Try to fill half of your plate at each meal  with fruits and vegetables. ? Eat whole grains, such as whole wheat pasta, brown rice, or whole grain bread. Fill about one quarter of your plate with whole grains. ? Eat low-fat diary products. ? Avoid fatty cuts of meat, processed or cured meats, and poultry with skin. Fill about one quarter of your plate  with lean proteins such as fish, chicken without skin, beans, eggs, and tofu. ? Avoid premade and processed foods. These tend to be higher in sodium, added sugar, and fat.  Reduce your daily sodium intake. Most people with hypertension should eat less than 1,500 mg of sodium a day.  Limit alcohol intake to no more than 1 drink a day for nonpregnant women and 2 drinks a day for men. One drink equals 12 oz of beer, 5 oz of wine, or 1 oz of hard liquor. Lifestyle  Work with your health care provider to maintain a healthy body weight, or to lose weight. Ask what an ideal weight is for you.  Get at least 30 minutes of exercise that causes your heart to beat faster (aerobic exercise) most days of the week. Activities may include walking, swimming, or biking.  Include exercise to strengthen your muscles (resistance exercise), such as weight lifting, as part of your weekly exercise routine. Try to do these types of exercises for 30 minutes at least 3 days a week.  Do not use any products that contain nicotine or tobacco, such as cigarettes and e-cigarettes. If you need help quitting, ask your health care provider.  Control any long-term (chronic) conditions you have, such as high cholesterol or diabetes. Monitoring  Monitor your blood pressure at home as told by your health care provider. Your personal target blood pressure may vary depending on your medical conditions, your age, and other factors.  Have your blood pressure checked regularly, as often as told by your health care provider. Working with your health care provider  Review all the medicines you take with your health care provider because there may be side effects or interactions.  Talk with your health care provider about your diet, exercise habits, and other lifestyle factors that may be contributing to hypertension.  Visit your health care provider regularly. Your health care provider can help you create and adjust your plan for  managing hypertension. Will I need medicine to control my blood pressure? Your health care provider may prescribe medicine if lifestyle changes are not enough to get your blood pressure under control, and if:  Your systolic blood pressure is 130 or higher.  Your diastolic blood pressure is 80 or higher.  Take medicines only as told by your health care provider. Follow the directions carefully. Blood pressure medicines must be taken as prescribed. The medicine does not work as well when you skip doses. Skipping doses also puts you at risk for problems. Contact a health care provider if:  You think you are having a reaction to medicines you have taken.  You have repeated (recurrent) headaches.  You feel dizzy.  You have swelling in your ankles.  You have trouble with your vision. Get help right away if:  You develop a severe headache or confusion.  You have unusual weakness or numbness, or you feel faint.  You have severe pain in your chest or abdomen.  You vomit repeatedly.  You have trouble breathing. Summary  Hypertension is when the force of blood pumping through your arteries is too strong. If this condition is not controlled, it may put you at  risk for serious complications.  Your personal target blood pressure may vary depending on your medical conditions, your age, and other factors. For most people, a normal blood pressure is less than 120/80.  Hypertension is managed by lifestyle changes, medicines, or both. Lifestyle changes include weight loss, eating a healthy, low-sodium diet, exercising more, and limiting alcohol. This information is not intended to replace advice given to you by your health care provider. Make sure you discuss any questions you have with your health care provider. Document Released: 12/10/2011 Document Revised: 02/13/2016 Document Reviewed: 02/13/2016 Elsevier Interactive Patient Education  [employer].

## 2017-07-29 NOTE — Progress Notes (Signed)
Subjective:    Patient ID: Kelli Chapman, female    DOB: June 24, 1960, 57 y.o.   MRN: [id number]  Kelli Chapman is a 57 y.o. female presenting on 07/29/2017 for Hypertension   HPI   Hypertension - She is checking BP at home or outside of clinic.  Readings 150/80s occasionally at home - Current medications: amlodipine 5 mg once daily, lisinopril 40 mg once daily, tolerating well without side effects - She is not currently symptomatic. - Pt denies headache, lightheadedness, dizziness, changes in vision, chest tightness/pressure, palpitations, leg swelling, sudden loss of speech or loss of consciousness. - She  reports no regular exercise routine outside of work.  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 - Her diet is moderate in salt, moderate in fat, and moderate in carbohydrates.   History of  Motion sickness Pt has history of motion sickness and use of scopolamine patch for prior cruises.  She is going on a cruise next week and would like to have a prescription for her motion sickness.  Social History   Tobacco Use  . Smoking status: Never Smoker  . Smokeless tobacco: Never Used  Substance Use Topics  . Alcohol use: No  . Drug use: No    Review of Systems Per HPI unless specifically indicated above     Objective:    BP (!) 152/87 (BP Location: Right Arm, Patient Position: Sitting, Cuff Size: Large)   Pulse 73   Temp 98.1 F (36.7 C) (Oral)   Ht  (1.626 m)   Wt 235 lb 3.2 oz (106.7 kg)   BMI 40.37 kg/m   Wt Readings from Last 3 Encounters:  07/29/17 235 lb 3.2 oz (106.7 kg)  03/18/16 235 lb (106.6 kg)  03/10/16 235 lb (106.6 kg)    Physical Exam  Constitutional: She is oriented to person, place, and time. She appears well-developed and well-nourished. No distress.  HENT:  Head: Normocephalic and atraumatic.  Cardiovascular: Normal rate, regular rhythm, S1 normal, S2 normal, normal heart sounds and  intact distal pulses.  Pulmonary/Chest: Effort normal and breath sounds normal. No respiratory distress.  Neurological: She is alert and oriented to person, place, and time.  Skin: Skin is warm and dry. Capillary refill takes less than 2 seconds.  Psychiatric: She has a normal mood and affect. Her behavior is normal.  Vitals reviewed.      Assessment & Plan:   Problem List Items Addressed This Visit      Cardiovascular and Mediastinum   HBP (high blood pressure) - Primary    Currently uncontrolled hypertension off medications x 5 days, but had similarly elevated BP readings when checked at work in past.  BP goal < 130/80.  Pt is not currently working on lifestyle modifications.  Taking medications tolerating well without side effects. No currently identified complications.  Plan: 1. Continue taking lisinopril 40 mg and amlodipine 5 mg once daily - START hydrochlorothiazide 25 mg once daily.  If reaching goal, reduce lisinopril and consider combination pill. 2. Obtain labs CMP today  3. Encouraged heart healthy diet and increasing exercise to 30 minutes most days of the week. 4. Check BP 1-2 x per week at home, keep log, and bring to clinic at next appointment. 5. Follow up 4 weeks.        Relevant Medications   lisinopril (PRINIVIL,ZESTRIL) 40 MG tablet   hydrochlorothiazide (HYDRODIURIL) 25  MG tablet   amLODipine (NORVASC) 5 MG tablet   Other Relevant Orders   COMPLETE METABOLIC PANEL WITH GFR    Other Visit Diagnoses    Hx of motion sickness       Pt with history of motion sickness with cruise.  Use of scopolamine patch in past prevented motion sickness. Place one patch 6 hrs prior.  Replace 72 hours.   Relevant Medications   scopolamine (TRANSDERM-SCOP, 1.5 MG,) 1 MG/3DAYS      Meds ordered this encounter  Medications  . lisinopril (PRINIVIL,ZESTRIL) 40 MG tablet    Sig: Take 1 tablet (40 mg total) by mouth daily.    Dispense:  90 tablet    Refill:  1    Order Specific  Question:   Supervising Provider    Answer:   Smitty Cords [[id number]]  . hydrochlorothiazide (HYDRODIURIL) 25 MG tablet    Sig: Take 1 tablet (25 mg total) by mouth daily.    Dispense:  30 tablet    Refill:  5    Order Specific Question:   Supervising Provider    Answer:   Smitty Cords [[id number]]  . amLODipine (NORVASC) 5 MG tablet    Sig: Take 1 tablet (5 mg total) by mouth daily.    Dispense:  90 tablet    Refill:  1    Order Specific Question:   Supervising Provider    Answer:   Smitty Cords [[id number]]  . scopolamine (TRANSDERM-SCOP, 1.5 MG,) 1 MG/3DAYS    Sig: Place 1 patch (1.5 mg total) onto the skin every 3 (three) days.    Dispense:  2 patch    Refill:  0    May provide a partial fill.    Order Specific Question:   Supervising Provider    Answer:   Smitty Cords [[id number]]    Follow up plan: Return in about 1 month (around 08/26/2017) for hypertension.  Wilhelmina Mcardle, DNP, AGPCNP-BC Adult Gerontology Primary Care Nurse Practitioner [facility] 07/29/2017, 11:48 AM

## 2017-07-30 LAB — COMPLETE METABOLIC PANEL WITH GFR
AG Ratio: 1.4 (calc) (ref 1.0–2.5)
ALT: 9 U/L (ref 6–29)
AST: 15 U/L (ref 10–35)
Albumin: 3.9 g/dL (ref 3.6–5.1)
Alkaline phosphatase (APISO): 97 U/L (ref 33–130)
BUN: 11 mg/dL (ref 7–25)
CO2: 26 mmol/L (ref 20–32)
Calcium: 9 mg/dL (ref 8.6–10.4)
Chloride: 109 mmol/L (ref 98–110)
Creat: 0.75 mg/dL (ref 0.50–1.05)
GFR, Est African American: 104 mL/min/{1.73_m2} (ref >=60)
GFR, Est Non African American: 90 mL/min/{1.73_m2} (ref >=60)
Globulin: 2.8 g/dL (calc) (ref 1.9–3.7)
Glucose, Bld: 101 mg/dL — ABNORMAL HIGH (ref 65–99)
Potassium: 3.8 mmol/L (ref 3.5–5.3)
Sodium: 142 mmol/L (ref 135–146)
Total Bilirubin: 0.3 mg/dL (ref 0.2–1.2)
Total Protein: 6.7 g/dL (ref 6.1–8.1)

## 2017-09-02 ENCOUNTER — Encounter: Payer: Self-pay | Admitting: Nurse Practitioner

## 2017-09-02 ENCOUNTER — Ambulatory Visit (INDEPENDENT_AMBULATORY_CARE_PROVIDER_SITE_OTHER): Payer: No Typology Code available for payment source | Admitting: Nurse Practitioner

## 2017-09-02 ENCOUNTER — Other Ambulatory Visit: Payer: Self-pay

## 2017-09-02 VITALS — BP 134/65 | HR 68 | Temp 98.2°F | Ht 64.0 in | Wt 238.6 lb

## 2017-09-02 DIAGNOSIS — G8929 Other chronic pain: Secondary | ICD-10-CM

## 2017-09-02 DIAGNOSIS — I10 Essential (primary) hypertension: Secondary | ICD-10-CM

## 2017-09-02 DIAGNOSIS — M25512 Pain in left shoulder: Secondary | ICD-10-CM

## 2017-09-02 MED ORDER — DICLOFENAC SODIUM 75 MG PO TBEC: 75.0000 mg | DELAYED_RELEASE_TABLET | Freq: Two times a day (BID) | ORAL | 1 refills | Status: DC

## 2017-09-02 MED ORDER — HYDROCHLOROTHIAZIDE 25 MG PO TABS: 25.0000 mg | ORAL_TABLET | Freq: Every day | ORAL | 1 refills | Status: DC

## 2017-09-02 MED ORDER — LISINOPRIL 20 MG PO TABS: 20.0000 mg | ORAL_TABLET | Freq: Every day | ORAL | 1 refills | Status: DC

## 2017-09-02 NOTE — Patient Instructions (Addendum)
Kelli Chapman,   Thank you for coming in to clinic today.  1. Continue amlodipine and hydrochlorothiazide daily. 2. CHANGE lisinopril to 20 mg daily. 3. Send mychart message if you have any question or concern about BP or medicines. Goal for BP < 130/80 and higher (on meds) than 100/70.  TOO low is less than 90/60 or anytime you have dizziness/lightheadedness.  For shoulder pain: - START diclofenac 75 mg twice daily for 7 days.  Then take asneeded.  NO extra Ibuprofen, Aleeve, or Aspirin for pain.  ONLY take Tylenol/acetaminophen for pain relief between with diclofenac.  Please schedule a follow-up appointment with Wilhelmina McardleLauren Jyssica Rief, AGNP. Return in about 3 months (around 12/03/2017) for hypertension AND annual physical before or on 09/25/2017.  If you have any other questions or concerns, please feel free to call the clinic or send a message through MyChart. You may also schedule an earlier appointment if necessary.  You will receive a survey after today's visit either digitally by e-mail or paper by [employer] mail. Your experiences and feedback matter to us.  Please respond so we know how we are doing as we provide care for you.   Wilhelmina McardleLauren Emersynn Deatley, DNP, AGNP-BC Adult Gerontology Nurse Practitioner [facility], [facility]

## 2017-09-02 NOTE — Progress Notes (Signed)
Subjective:    Patient ID: Kelli Chapman, female    DOB: 1960/12/12, 57 y.o.   MRN: [id number]  Kelli Chapman is a 57 y.o. female presenting on 09/02/2017 for Hypertension (blood pressure ranging around 108/70 w/o medication)   HPI Hypertension - She is checking BP at home or outside of clinic.  Readings 105-120/80s after several weeks of new hctz - Current medications: amlodipine 5 mg once daily, tolerating well without side effects - She is not currently symptomatic. - Pt denies headache, lightheadedness, dizziness, changes in vision, chest tightness/pressure, palpitations, leg swelling, sudden loss of speech or loss of consciousness. - She  reports no regular exercise routine. - Her diet is moderate in salt, moderate in fat, and moderate in carbohydrates.    LEFT Shoulder Pain Patient presents today with chronic left shoulder pain that has not significantly worsened over the last several months.  Patient takes ibuprofen 800 mg 3 times daily as needed usually only takes to 3 times per week.  Patient does have pain daily, however does not wish to take ibuprofen daily.  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  She notes it is often very difficult to practice proper body mechanics because she does not always have assistant for patient mobility.  She has had previous right rotator cuff tear with repair.  Right shoulder continues to be weaker after her surgery, so patient for dominantly uses left arm and shoulder for strength.  Social History   Tobacco Use  . Smoking status: Never Smoker  . Smokeless tobacco: Never Used  Substance Use Topics  . Alcohol use: No  . Drug use: No    Review of Systems Per HPI unless specifically indicated above     Objective:    BP 134/65 (BP Location: Right Arm, Patient Position: Sitting, Cuff Size: Large)   Pulse 68   Temp 98.2 F (36.8 C) (Oral)   Ht 5\' 4"  (1.626 m)   Wt 238 lb 9.6 oz (108.2 kg)   BMI 40.96 kg/m   Wt Readings from  Last 3 Encounters:  09/02/17 238 lb 9.6 oz (108.2 kg)  07/29/17 235 lb 3.2 oz (106.7 kg)  03/18/16 235 lb (106.6 kg)    Physical Exam  Constitutional: She is oriented to person, place, and time. She appears well-developed and well-nourished. No distress.  Neck: Normal range of motion. Neck supple. Carotid bruit is not present.  Cardiovascular: Normal rate, regular rhythm, S1 normal, S2 normal, normal heart sounds and intact distal pulses.  Pulmonary/Chest: Effort normal and breath sounds normal. No respiratory distress.  Musculoskeletal: She exhibits no edema (pedal).  LEFT Shoulder Inspection: Normal appearance bilateral symmetrical Palpation: Non-tender to palpation over anterior, lateral, or posterior shoulder  ROM: Full intact active ROM forward flexion, abduction, internal / external rotation, symmetrical.  Crepitus present.  Mild tenderness of posterior shoulder external rotation. Special Testing: Rotator cuff testing negative for weakness with supraspinatus full can and empty can test, O'brien's negative for labral pain, Hawkin's AC impingement negative for pain Strength: Normal strength 5/5 flex/ext, ext rot / int rot, grip, rotator cuff str testing. Neurovascular: Distally intact pulses, sensation to light touch  Neurological: She is alert and oriented to person, place, and time.  Skin: Skin is warm and dry.  Psychiatric: She has a normal mood and affect. Her behavior is normal.  Vitals reviewed.  Results for orders placed or performed in visit on 07/29/17  COMPLETE METABOLIC PANEL WITH GFR  Result Value Ref Range   Glucose, Bld 101 (H) 65 - 99 mg/dL   BUN 11 7 - 25 mg/dL   Creat 1.61 0.96 - 0.45 mg/dL   GFR, Est Non African American 90 > OR = 60 mL/min/1.27m2   GFR, Est African American 104 > OR = 60 mL/min/1.69m2   BUN/Creatinine Ratio NOT APPLICABLE 6 - 22 (calc)   Sodium 142 135 - 146 mmol/L   Potassium 3.8 3.5 - 5.3 mmol/L   Chloride 109 98 - 110 mmol/L   CO2 26 20 -  32 mmol/L   Calcium 9.0 8.6 - 10.4 mg/dL   Total Protein 6.7 6.1 - 8.1 g/dL   Albumin 3.9 3.6 - 5.1 g/dL   Globulin 2.8 1.9 - 3.7 g/dL (calc)   AG Ratio 1.4 1.0 - 2.5 (calc)   Total Bilirubin 0.3 0.2 - 1.2 mg/dL   Alkaline phosphatase (APISO) 97 33 - 130 U/L   AST 15 10 - 35 U/L   ALT 9 6 - 29 U/L      Assessment & Plan:   Problem List Items Addressed This Visit      Cardiovascular and Mediastinum   HBP (high blood pressure) Sub-optimally controlled hypertension.  BP goal < 130/80.  Pt is working on lifestyle modifications.  Taking medications tolerating well without side effects. No currently identified complications.  Plan: 1. Continue taking amlodipine and hydrochlorothiazide daily - INCREASE lisinopril to 20 mg one tab po daily from 10 mg daily. 2. Last labs kidney function normal.  Recheck at next visit.  3. Encouraged heart healthy diet and increasing exercise to 30 minutes most days of the week. 4. Check BP 1-2 x per week at home, keep log, and bring to clinic at next appointment. 5. Follow up 3 months.     Relevant Medications   lisinopril (PRINIVIL,ZESTRIL) 20 MG tablet   hydrochlorothiazide (HYDRODIURIL) 25 MG tablet    Other Visit Diagnoses    Chronic left shoulder pain    -  Primary Pain likely related to osteoarthritis or overuse.  Plan:  1. Treat with OTC pain meds (acetaminophen).  Discussed alternate dosing and max dosing.  with prescription NSAID. - START diclofenac 75 mg one tab po bid x 7 days, then prn.  Avoid all other NSAIDS. 2. Apply heat and/or ice to affected area. 3. May also apply a muscle rub with lidocaine or lidocaine patch after heat or ice. 4. Encouraged patient to continue using proper body mechanics at her work.  5. Follow up 2-4 weeks prn.  Consider physical therapy in future if needed.     Relevant Medications   diclofenac (VOLTAREN) 75 MG EC tablet      Meds ordered this encounter  Medications  . diclofenac (VOLTAREN) 75 MG EC  tablet    Sig: Take 1 tablet (75 mg total) by mouth 2 (two) times daily.    Dispense:  30 tablet    Refill:  1    Order Specific Question:   Supervising Provider    Answer:   Smitty Cords [[id number]]  . lisinopril (PRINIVIL,ZESTRIL) 20 MG tablet    Sig: Take 1 tablet (20 mg total) by mouth daily.    Dispense:  90 tablet    Refill:  1    Order Specific Question:   Supervising Provider    Answer:   Smitty Cords [[id number]]  . hydrochlorothiazide (HYDRODIURIL) 25 MG tablet    Sig: Take 1 tablet (25 mg total) by mouth daily.  Dispense:  90 tablet    Refill:  1    Order Specific Question:   Supervising Provider    Answer:   Smitty Cords [[id number]]    Follow up plan: Return in about 3 months (around 12/03/2017) for hypertension AND annual physical before or on 09/25/2017.  Kelli Mcardle, DNP, AGPCNP-BC Adult Gerontology Primary Care Nurse Practitioner [facility] 09/02/2017, 8:15 AM

## 2017-09-04 ENCOUNTER — Ambulatory Visit (INDEPENDENT_AMBULATORY_CARE_PROVIDER_SITE_OTHER): Payer: No Typology Code available for payment source | Admitting: Nurse Practitioner

## 2017-09-04 ENCOUNTER — Encounter: Payer: Self-pay | Admitting: Nurse Practitioner

## 2017-09-04 ENCOUNTER — Other Ambulatory Visit: Payer: Self-pay

## 2017-09-04 VITALS — BP 139/75 | HR 65 | Temp 98.0°F | Ht 64.0 in | Wt 236.2 lb

## 2017-09-04 DIAGNOSIS — Z124 Encounter for screening for malignant neoplasm of cervix: Secondary | ICD-10-CM | POA: Diagnosis not present

## 2017-09-04 DIAGNOSIS — Z1211 Encounter for screening for malignant neoplasm of colon: Secondary | ICD-10-CM | POA: Diagnosis not present

## 2017-09-04 DIAGNOSIS — E7800 Pure hypercholesterolemia, unspecified: Secondary | ICD-10-CM | POA: Diagnosis not present

## 2017-09-04 DIAGNOSIS — Z0000 Encounter for general adult medical examination without abnormal findings: Secondary | ICD-10-CM

## 2017-09-04 DIAGNOSIS — Z1231 Encounter for screening mammogram for malignant neoplasm of breast: Secondary | ICD-10-CM | POA: Diagnosis not present

## 2017-09-04 DIAGNOSIS — Z23 Encounter for immunization: Secondary | ICD-10-CM | POA: Diagnosis not present

## 2017-09-04 DIAGNOSIS — Z13820 Encounter for screening for osteoporosis: Secondary | ICD-10-CM

## 2017-09-04 DIAGNOSIS — Z1239 Encounter for other screening for malignant neoplasm of breast: Secondary | ICD-10-CM

## 2017-09-04 MED ORDER — TETANUS-DIPHTH-ACELL PERTUSSIS 5-2.5-18.5 LF-MCG/0.5 IM SUSP: 0.5000 mL | Freq: Once | INTRAMUSCULAR | Status: DC

## 2017-09-04 NOTE — Patient Instructions (Addendum)
Kelli Chapman,   Thank you for coming in to clinic today.  1. You will be due for FASTING BLOOD WORK.  This means you should eat no food or drink after midnight.  Drink only water or coffee without cream/sugar on the morning of your lab visit. - Have these drawn in the next 7 days at the [place] [facility]. - Your results will be available about 2-3 days after blood draw.  If you have set up a MyChart account, you can can log in to MyChart online to view your results and a brief explanation. Also, we can discuss your results together at your next office visit if you would like.   2. Your mammogram and bone density scan orders have been placed.  Call the Scheduling phone number at [phone] to schedule your tests at your convenience.  You can choose to go to either location listed below.  Let the scheduler know which location you prefer.  [place]  [place], [place] [address]   [facility] [address] [place], [place] [address]   3. Continue work with healthy eating and regular physical activity.  4. Dental Options Locally:  Others are also available, so check with your insurance for preferred providers. [facility] Address: [address], [place], [place] [address]  Phone: [phone]   Ruthy Dick, DDS Address: [address], [place], [place] [address]  Phone: [phone]   5. You received your tetanus vaccine today.  Your arm will be sore for a day or two.    6. Colon Cancer Screening: - For all adults age 51 and older, routine colon cancer screening is highly recommended. - Early detection of colon cancer is important, because often there are no warning signs or symptoms.  If colon cancer is found early, usually it can be cured. Advanced cancer is hard to treat.  - If you are not interested in Colonoscopy screening (if done and normal you could be  cleared for 5 to 10 years until next due), then Cologuard is an excellent alternative for screening test for Colon Cancer. It is highly sensitive for detecting DNA of colon cancer from even the earliest stages. Also, there is NO bowel prep required. - If Cologuard is NEGATIVE, then it is good for 3 years before next due - If Cologuard is POSITIVE, then it is strongly advised to get a Colonoscopy, which allows the GI doctor to locate the source of the cancer or polyp (even very early stage) and treat it by removing it.  - FIRST, call your insurance company and tell them you want to check cost of Cologuard tell them CPT Code [phone] (it may be completely covered with a small or no cost, OR max cost without any coverage is about $600). If you do NOT open the kit, and decide not to do the test, you will NOT be charged, you should contact the company to return the kit if you decide not to do the test. The test kit will be delivered to your house in about 1 week. Follow instructions to collect your stool sample.  You may call the company for any help or questions, 24/7 telephone support at [phone].   Please schedule a follow-up appointment with [name], AGNP. Return in about 1 year (around 09/05/2018) for annual physical.  If you have any other questions or concerns, please feel free to call  the clinic or send a message through [facility]. You may also schedule an earlier appointment if necessary.  You will receive a survey after today's visit either digitally by e-mail or paper by [name]. Your experiences and feedback matter to [place].  Please respond so we know how we are doing as we provide care for you.   [name], DNP, AGNP-BC Adult Gerontology Nurse Practitioner [place]

## 2017-09-04 NOTE — Progress Notes (Signed)
Subjective:    Patient ID: Kelli Chapman, female    DOB: 07/14/1960, 57 y.o.   MRN: [id number]  Kelli Chapman is a 57 y.o. female presenting on 09/04/2017 for Annual Exam   HPI Annual Physical Exam Patient has been feeling well and better with diclofenac for shoulder pain.  They have no acute concerns today. Sleeps 5-6 hours per night uninterrupted.  HEALTH MAINTENANCE: Weight/BMI: stable Physical activity: with job only Diet: always meals out Seatbelt: always Sunscreen: not usually PAP: due Mammogram: due DEXA: due Colon Cancer Screen: pt never in past, no fam hx, cologuard HIV/HEP C:  Optometry: in last 1 year Dentistry: 1 year ago  VACCINES: Tetanus: due today Influenza: regularly with work Shingles: counseled  Past Medical History:  Diagnosis Date  . Arthritis    right shoulder  . Hypertension    Past Surgical History:  Procedure Laterality Date  . NO PAST SURGERIES    . SHOULDER ARTHROSCOPY WITH OPEN ROTATOR CUFF REPAIR Right 03/18/2016   Procedure: SHOULDER ARTHROSCOPY WITH OPEN ROTATOR CUFF REPAIR Label debridement, Decompression;  Surgeon: Christena Flake, MD;  Location: [facility] ORS;  Service: Orthopedics;  Laterality: Right;   Social History   Socioeconomic History  . Marital status: Widowed    Spouse name: Not on file  . Number of children: Not on file  . Years of education: Not on file  . Highest education level: Not on file  Occupational History  . Not on file  Social Needs  . Financial resource strain: Not on file  . Food insecurity:    Worry: Not on file    Inability: Not on file  . Transportation needs:    Medical: Not on file    Non-medical: Not on file  Tobacco Use  . Smoking status: Never Smoker  . Smokeless tobacco: Never Used  Substance and Sexual Activity  . Alcohol use: No  . Drug use: No  . Sexual activity: Not on file  Lifestyle  . Physical activity:    Days per week: Not on file    Minutes per session: Not on file  . Stress: Not on  file  Relationships  . Social connections:    Talks on phone: Not on file    Gets together: Not on file    Attends religious service: Not on file    Active member of club or organization: Not on file    Attends meetings of clubs or organizations: Not on file    Relationship status: Not on file  . Intimate partner violence:    Fear of current or ex partner: Not on file    Emotionally abused: Not on file    Physically abused: Not on file    Forced sexual activity: Not on file  Other Topics Concern  . Not on file  Social History Narrative  . Not on file   Family History  Problem Relation Age of Onset  . Hypertension Mother   . Heart disease Mother   . Kidney disease Paternal Grandfather    Current Outpatient Medications on File Prior to Visit  Medication Sig  . amLODipine (NORVASC) 5 MG tablet Take 1 tablet (5 mg total) by mouth daily.  . diclofenac (VOLTAREN) 75 MG EC tablet Take 1 tablet (75 mg total) by mouth 2 (two) times daily.  . hydrochlorothiazide (HYDRODIURIL) 25 MG tablet Take 1 tablet (25 mg total) by mouth daily.  [place] lisinopril (PRINIVIL,ZESTRIL) 20 MG tablet Take 1 tablet (20 mg total)  by mouth daily.   No current facility-administered medications on file prior to visit.     Review of Systems  Constitutional: Negative for chills and fever.  HENT: Negative for congestion and sore throat.   Eyes: Negative for pain.  Respiratory: Negative for cough, shortness of breath and wheezing.   Cardiovascular: Negative for chest pain, palpitations and leg swelling.  Gastrointestinal: Negative for abdominal pain, blood in stool, constipation, diarrhea, nausea and vomiting.  Endocrine: Negative for polydipsia.  Genitourinary: Negative for difficulty urinating, dysuria, frequency, hematuria and urgency.  Musculoskeletal: Negative for back pain, myalgias and neck pain.  Skin: Negative.  Negative for rash.  Allergic/Immunologic: Negative for environmental allergies.    Neurological: Negative for dizziness, weakness and headaches.  Hematological: Does not bruise/bleed easily.  Psychiatric/Behavioral: Negative for dysphoric mood and suicidal ideas. The patient is not nervous/anxious.    Per HPI unless specifically indicated above     Objective:    BP 139/75 (BP Location: Right Arm, Patient Position: Sitting, Cuff Size: Normal)   Pulse 65   Temp 98 F (36.7 C) (Oral)   Ht 5\' 4"  (1.626 m)   Wt 236 lb 3.2 oz (107.1 kg)   BMI 40.54 kg/m   Wt Readings from Last 3 Encounters:  09/04/17 236 lb 3.2 oz (107.1 kg)  09/02/17 238 lb 9.6 oz (108.2 kg)  07/29/17 235 lb 3.2 oz (106.7 kg)    Physical Exam  Constitutional: She is oriented to person, place, and time. She appears well-developed. No distress.  Obese with central adiposity  HENT:  Head: Normocephalic and atraumatic.  Right Ear: External ear normal.  Left Ear: External ear normal.  Nose: Nose normal.  Mouth/Throat: Oropharynx is clear and moist.  Eyes: Pupils are equal, round, and reactive to light. Conjunctivae are normal.  Neck: Normal range of motion. Neck supple. No JVD present. No tracheal deviation present. No thyromegaly present.  Cardiovascular: Normal rate, regular rhythm, normal heart sounds and intact distal pulses. Exam reveals no gallop and no friction rub.  No murmur heard. Pulmonary/Chest: Effort normal and breath sounds normal. No respiratory distress.  Breast - Normal exam w/ symmetric breasts, no mass, no nipple discharge, no skin changes or tenderness.   Abdominal: Soft. Bowel sounds are normal. She exhibits no distension. There is no tenderness.  Genitourinary:  Genitourinary Comments: Normal external female genitalia without lesions or fusion. Vaginal canal without lesions. Normal appearing cervix without lesions or friability. Physiologic discharge on exam. Bimanual exam without adnexal masses, enlarged uterus, or cervical motion tenderness.  Musculoskeletal: Normal range of  motion.  Lymphadenopathy:    She has no cervical adenopathy.  Neurological: She is alert and oriented to person, place, and time. No cranial nerve deficit.  Skin: Skin is warm and dry.  Psychiatric: She has a normal mood and affect. Her behavior is normal. Judgment and thought content normal.  Nursing note and vitals reviewed.    Results for orders placed or performed in visit on 07/29/17  COMPLETE METABOLIC PANEL WITH GFR  Result Value Ref Range   Glucose, Bld 101 (H) 65 - 99 mg/dL   BUN 11 7 - 25 mg/dL   Creat 1.61 0.96 - 0.45 mg/dL   GFR, Est Non African American 90 > OR = 60 mL/min/1.61m2   GFR, Est African American 104 > OR = 60 mL/min/1.65m2   BUN/Creatinine Ratio NOT APPLICABLE 6 - 22 (calc)   Sodium 142 135 - 146 mmol/L   Potassium 3.8 3.5 - 5.3 mmol/L  Chloride 109 98 - 110 mmol/L   CO2 26 20 - 32 mmol/L   Calcium 9.0 8.6 - 10.4 mg/dL   Total Protein 6.7 6.1 - 8.1 g/dL   Albumin 3.9 3.6 - 5.1 g/dL   Globulin 2.8 1.9 - 3.7 g/dL (calc)   AG Ratio 1.4 1.0 - 2.5 (calc)   Total Bilirubin 0.3 0.2 - 1.2 mg/dL   Alkaline phosphatase (APISO) 97 33 - 130 U/L   AST 15 10 - 35 U/L   ALT 9 6 - 29 U/L      Assessment & Plan:   Problem List Items Addressed This Visit    None    Visit Diagnoses    Encounter for annual physical exam    -  Primary   Relevant Orders   Pap IG and HPV (high risk) DNA detection   CBC with Differential/Platelet   Hemoglobin A1c   Hepatitis C antibody   HIV antibody   Lipid panel   TSH   Comprehensive Metabolic Panel (CMET)   MM DIGITAL SCREENING BILATERAL   DG Bone Density   Cologuard   Encounter for Papanicolaou smear for cervical cancer screening       Relevant Orders   Pap IG and HPV (high risk) DNA detection   Breast cancer screening       Relevant Orders   MM DIGITAL SCREENING BILATERAL   Colon cancer screening       Relevant Orders   Cologuard   Osteoporosis screening       Relevant Orders   DG Bone Density      Physical  exam with no new findings.  Well adult with no acute concerns.  Plan: 1. Obtain health maintenance screenings as above according to age (Labs, PAP, Mammo, DEXA, Cologuard). - Increase physical activity to 30 minutes most days of the week.  - Eat healthy diet high in vegetables and fruits; low in refined carbohydrates. 2. Return 1 year for annual physical.   Follow up plan: Return in about 1 year (around 09/05/2018) for annual physical.  Wilhelmina McardleLauren Andrius Andrepont, DNP, AGPCNP-BC Adult Gerontology Primary Care Nurse Practitioner [facility] 09/04/2017, 11:20 AM

## 2017-09-07 LAB — PAP IG AND HPV HIGH-RISK: HPV DNA High Risk: NOT DETECTED

## 2017-09-07 NOTE — Addendum Note (Signed)
Addended by: Vernard GamblesKENNEDY, Dangela How R on: 09/07/2017 10:18 AM   Modules accepted: Orders

## 2017-09-07 NOTE — Addendum Note (Signed)
Addended by: Wilhelmina McardleKENNEDY, Laurette Villescas R on: 09/07/2017 10:17 AM   Modules accepted: Orders

## 2017-09-09 ENCOUNTER — Other Ambulatory Visit
Admission: RE | Admit: 2017-09-09 | Discharge: 2017-09-09 | Disposition: A | Discharge status: Home / Self Care | Payer: No Typology Code available for payment source | Source: Ambulatory Visit | Attending: Nurse Practitioner | Admitting: Nurse Practitioner

## 2017-09-09 DIAGNOSIS — Z0000 Encounter for general adult medical examination without abnormal findings: Secondary | ICD-10-CM | POA: Insufficient documentation

## 2017-09-09 LAB — LIPID PANEL
Cholesterol: 207 mg/dL — ABNORMAL HIGH (ref 0–200)
HDL: 42 mg/dL (ref >40)
LDL Cholesterol: 148 mg/dL — ABNORMAL HIGH (ref 0–99)
Total CHOL/HDL Ratio: 4.9 RATIO
Triglycerides: 86 mg/dL (ref <150)
VLDL: 17 mg/dL (ref 0–40)

## 2017-09-09 LAB — COMPREHENSIVE METABOLIC PANEL
ALT: 19 U/L (ref 14–54)
AST: 25 U/L (ref 15–41)
Albumin: 4.1 g/dL (ref 3.5–5.0)
Alkaline Phosphatase: 76 U/L (ref 38–126)
Anion gap: 8 (ref 5–15)
BUN: 17 mg/dL (ref 6–20)
CO2: 27 mmol/L (ref 22–32)
Calcium: 8.8 mg/dL — ABNORMAL LOW (ref 8.9–10.3)
Chloride: 107 mmol/L (ref 101–111)
Creatinine, Ser: 0.86 mg/dL (ref 0.44–1.00)
GFR calc Af Amer: >60 mL/min (ref >60)
GFR calc non Af Amer: >60 mL/min (ref >60)
Glucose, Bld: 102 mg/dL — ABNORMAL HIGH (ref 65–99)
Potassium: 3.4 mmol/L — ABNORMAL LOW (ref 3.5–5.1)
Sodium: 142 mmol/L (ref 135–145)
Total Bilirubin: 0.5 mg/dL (ref 0.3–1.2)
Total Protein: 7.9 g/dL (ref 6.5–8.1)

## 2017-09-09 LAB — CBC WITH DIFFERENTIAL/PLATELET
Basophils Absolute: 0.1 10*3/uL (ref 0–0.1)
Basophils Relative: 1 %
Eosinophils Absolute: 0.1 10*3/uL (ref 0–0.7)
Eosinophils Relative: 2 %
HCT: 36.6 % (ref 35.0–47.0)
Hemoglobin: 12.1 g/dL (ref 12.0–16.0)
Lymphocytes Relative: 30 %
Lymphs Abs: 2.1 10*3/uL (ref 1.0–3.6)
MCH: 26 pg (ref 26.0–34.0)
MCHC: 33 g/dL (ref 32.0–36.0)
MCV: 78.8 fL — ABNORMAL LOW (ref 80.0–100.0)
Monocytes Absolute: 1 10*3/uL — ABNORMAL HIGH (ref 0.2–0.9)
Monocytes Relative: 14 %
Neutro Abs: 3.8 10*3/uL (ref 1.4–6.5)
Neutrophils Relative %: 53 %
Platelets: 301 10*3/uL (ref 150–440)
RBC: 4.64 MIL/uL (ref 3.80–5.20)
RDW: 14.6 % — ABNORMAL HIGH (ref 11.5–14.5)
WBC: 7.1 10*3/uL (ref 3.6–11.0)

## 2017-09-09 LAB — TSH: TSH: 1.492 u[IU]/mL (ref 0.350–4.500)

## 2017-09-09 LAB — HEMOGLOBIN A1C
Hgb A1c MFr Bld: 6 % — ABNORMAL HIGH (ref 4.8–5.6)
Mean Plasma Glucose: 125.5 mg/dL

## 2017-09-09 MED ORDER — ATORVASTATIN CALCIUM 20 MG PO TABS: 20.0000 mg | ORAL_TABLET | Freq: Every day | ORAL | 3 refills | Status: DC

## 2017-09-09 NOTE — Addendum Note (Signed)
Addended by: Vernard GamblesKENNEDY, Ascencion Stegner R on: 09/09/2017 01:43 PM   Modules accepted: Orders

## 2017-09-10 LAB — HEPATITIS C ANTIBODY: HCV Ab: <0.1 s/co ratio (ref 0.0–0.9)

## 2017-09-10 LAB — HIV ANTIBODY (ROUTINE TESTING W REFLEX): HIV Screen 4th Generation wRfx: NONREACTIVE

## 2017-09-13 IMAGING — MR MR SHOULDER*R* W/O CM
5 series · 40 of 40 positions shown · non-contrast
Comparison: None.

CLINICAL DATA: Right shoulder pain radiating into the upper arm

EXAM:
MRI OF THE RIGHT SHOULDER WITHOUT CONTRAST
TECHNIQUE: Multiplanar, multisequence MR imaging of the shoulder was performed.
No intravenous contrast was administered.

[Series 3: T2 fat-sat · axial · 4.0mm · 0.55mm/px · z∈[-39,+58]mm · 8 of 23 slices shown (1 of 3)]
[im 1/23]
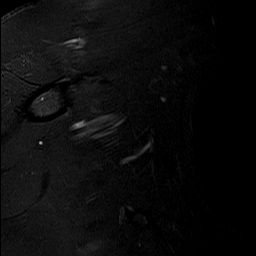
[im 4/23]
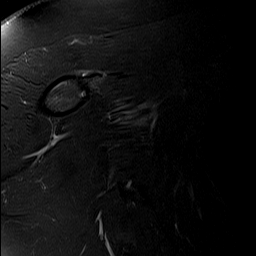
[im 7/23]
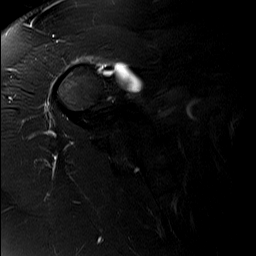
[im 10/23]
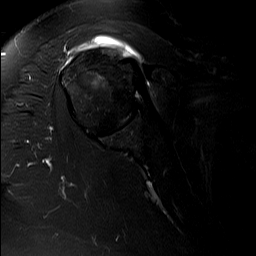
[im 13/23]
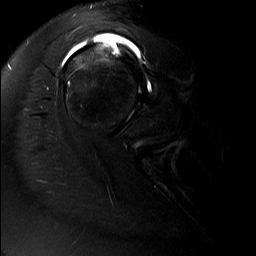
[im 16/23]
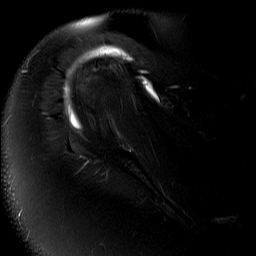
[im 19/23]
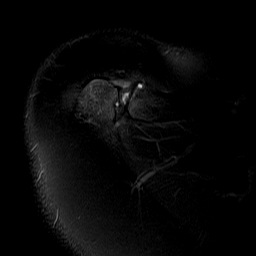
[im 23/23]
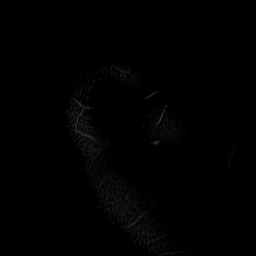

[Series 4: T2 fat-sat · sagittal · 4.0mm · 0.62mm/px · 8 of 23 slices shown (2 of 3)]
[im 1/23]
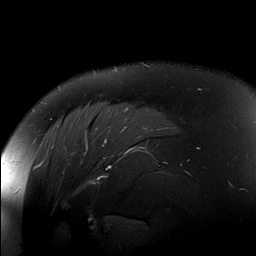
[im 4/23]
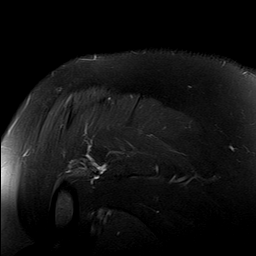
[im 7/23]
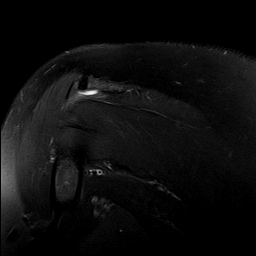
[im 10/23]
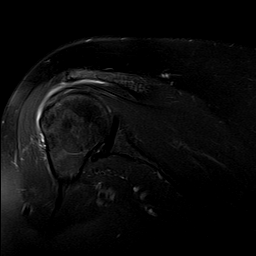
[im 13/23]
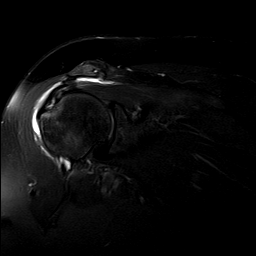
[im 16/23]
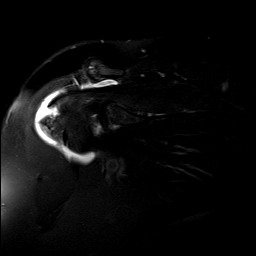
[im 19/23]
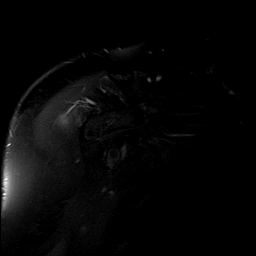
[im 23/23]
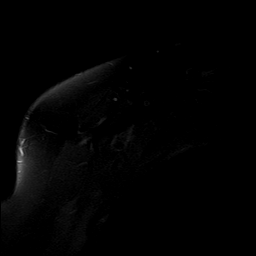

[Series 5: PD · sagittal · 4.0mm · 0.62mm/px · 8 of 23 slices shown]
[im 1/23]
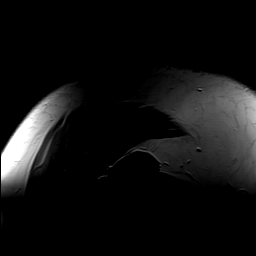
[im 4/23]
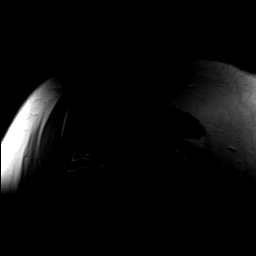
[im 7/23]
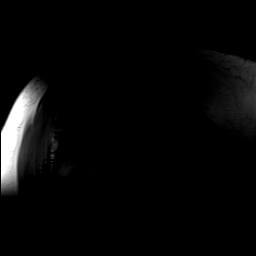
[im 10/23]
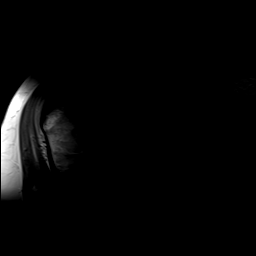
[im 13/23]
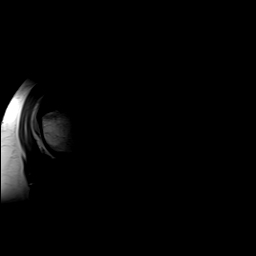
[im 16/23]
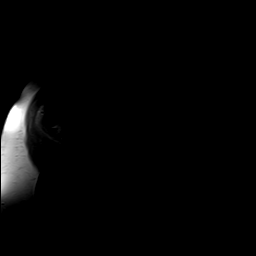
[im 19/23]
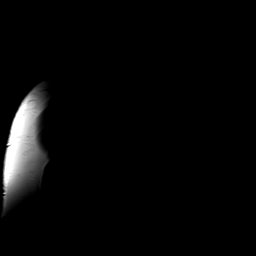
[im 23/23]
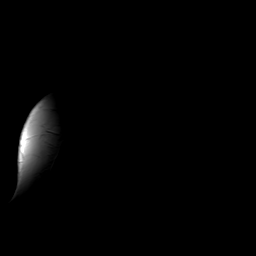

[Series 6: T1 · coronal · 4.0mm · 0.62mm/px · 8 of 23 slices shown]
[im 1/23]
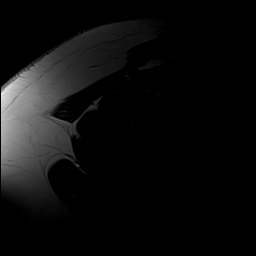
[im 4/23]
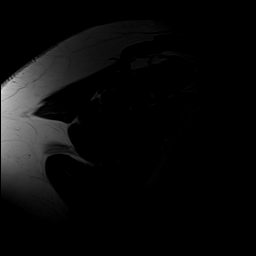
[im 7/23]
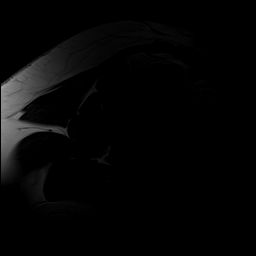
[im 10/23]
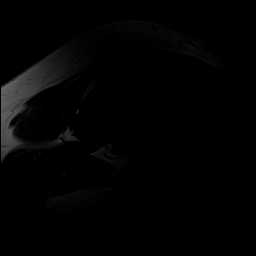
[im 13/23]
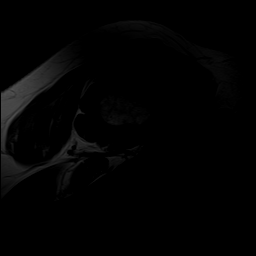
[im 16/23]
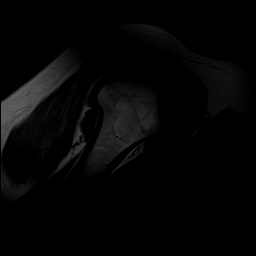
[im 19/23]
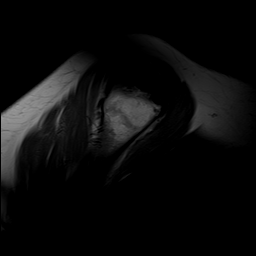
[im 23/23]
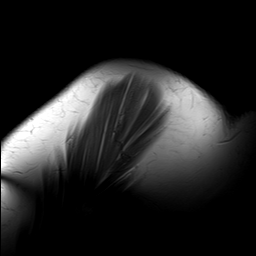

[Series 7: T2 fat-sat · coronal · 4.0mm · 0.62mm/px · 8 of 23 slices shown (3 of 3)]
[im 1/23]
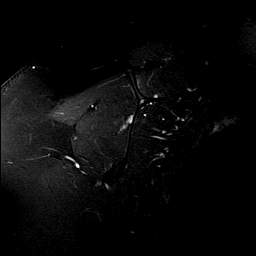
[im 4/23]
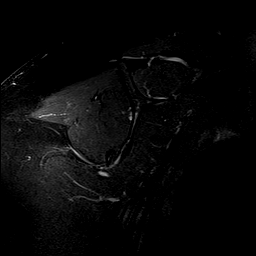
[im 7/23]
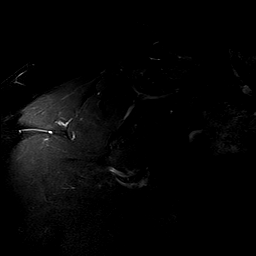
[im 10/23]
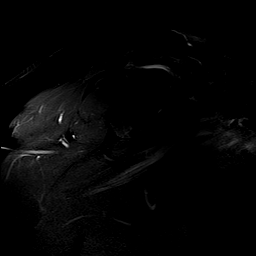
[im 13/23]
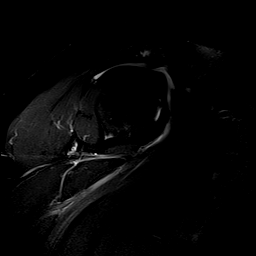
[im 16/23]
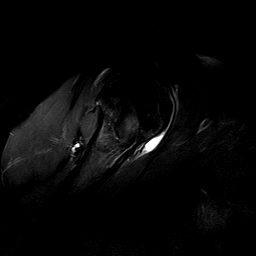
[im 19/23]
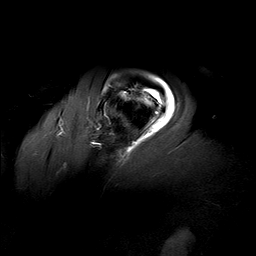
[im 23/23]
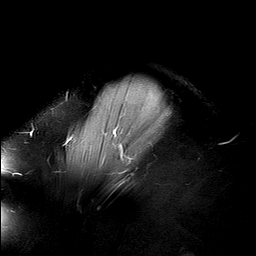

[40 of 40 positions shown; findings below may reference images not displayed]

FINDINGS: Rotator cuff: Severe tendinosis of the supraspinatus tendon with a
high-grade partial-thickness bursal surface tear versus
full-thickness tear of the anterior fibers measuring 9 mm in
anterior-posterior dimension. Moderate tendinosis of the
infraspinatus tendon. Teres minor tendon is intact. Subscapularis
tendon is intact.

Muscles: No atrophy or fatty replacement of nor abnormal signal
within, the muscles of the rotator cuff.

Biceps long head:  Intact.

Acromioclavicular Joint: Mild arthropathy of the acromioclavicular
joint. Type I acromion. Moderate amount of fluid in the
subacromial/subdeltoid bursa.

Glenohumeral Joint: No joint effusion.  No chondral defect.

Labrum: Grossly intact, but evaluation is limited by lack of
intraarticular fluid.

Bones:  No marrow abnormality, fracture or dislocation.

Other: None.
IMPRESSION: 1. Severe tendinosis of the supraspinatus tendon with a high-grade
partial-thickness bursal surface tear versus full-thickness tear of
the anterior fibers measuring 9 mm in anterior-posterior dimension.
2. Moderate tendinosis of the infraspinatus tendon.
3. Moderate subacromial/subdeltoid bursitis.

## 2017-10-05 LAB — COLOGUARD: Cologuard: NEGATIVE

## 2017-10-19 ENCOUNTER — Ambulatory Visit
Admission: RE | Admit: 2017-10-19 | Discharge: 2017-10-19 | Disposition: A | Discharge status: Home / Self Care | Payer: No Typology Code available for payment source | Source: Ambulatory Visit | Attending: Nurse Practitioner | Admitting: Nurse Practitioner

## 2017-10-19 DIAGNOSIS — Z13820 Encounter for screening for osteoporosis: Secondary | ICD-10-CM | POA: Diagnosis present

## 2017-10-19 DIAGNOSIS — Z1231 Encounter for screening mammogram for malignant neoplasm of breast: Secondary | ICD-10-CM | POA: Insufficient documentation

## 2017-10-19 DIAGNOSIS — Z0000 Encounter for general adult medical examination without abnormal findings: Secondary | ICD-10-CM

## 2017-10-19 DIAGNOSIS — Z1239 Encounter for other screening for malignant neoplasm of breast: Secondary | ICD-10-CM

## 2017-10-20 ENCOUNTER — Other Ambulatory Visit: Payer: Self-pay | Admitting: Nurse Practitioner

## 2017-10-20 DIAGNOSIS — R928 Other abnormal and inconclusive findings on diagnostic imaging of breast: Secondary | ICD-10-CM

## 2017-10-20 DIAGNOSIS — R2231 Localized swelling, mass and lump, right upper limb: Secondary | ICD-10-CM

## 2017-10-28 ENCOUNTER — Ambulatory Visit
Admission: RE | Admit: 2017-10-28 | Discharge: 2017-10-28 | Disposition: A | Discharge status: Home / Self Care | Payer: No Typology Code available for payment source | Source: Ambulatory Visit | Attending: Nurse Practitioner | Admitting: Nurse Practitioner

## 2017-10-28 DIAGNOSIS — R2231 Localized swelling, mass and lump, right upper limb: Secondary | ICD-10-CM | POA: Insufficient documentation

## 2017-10-28 DIAGNOSIS — R928 Other abnormal and inconclusive findings on diagnostic imaging of breast: Secondary | ICD-10-CM | POA: Diagnosis present

## 2017-12-16 ENCOUNTER — Ambulatory Visit (INDEPENDENT_AMBULATORY_CARE_PROVIDER_SITE_OTHER): Payer: No Typology Code available for payment source | Admitting: Nurse Practitioner

## 2017-12-16 ENCOUNTER — Other Ambulatory Visit: Payer: Self-pay

## 2017-12-16 ENCOUNTER — Encounter: Payer: Self-pay | Admitting: Nurse Practitioner

## 2017-12-16 VITALS — BP 174/80 | HR 62 | Temp 98.5°F | Resp 16 | Ht 64.0 in | Wt 234.2 lb

## 2017-12-16 DIAGNOSIS — Z6841 Body Mass Index (BMI) 40.0 and over, adult: Secondary | ICD-10-CM | POA: Diagnosis not present

## 2017-12-16 DIAGNOSIS — Z23 Encounter for immunization: Secondary | ICD-10-CM | POA: Diagnosis not present

## 2017-12-16 DIAGNOSIS — I10 Essential (primary) hypertension: Secondary | ICD-10-CM

## 2017-12-16 MED ORDER — AMLODIPINE BESYLATE 10 MG PO TABS: 10.0000 mg | ORAL_TABLET | Freq: Every day | ORAL | 5 refills | Status: DC

## 2017-12-16 NOTE — Progress Notes (Signed)
Subjective:    Patient ID: Kelli Chapman, female    DOB: [birth year], 57 y.o.   MRN: [id number]  Kelli Chapman is a 57 y.o. female presenting on 12/16/2017 for Hypertension   HPI Hypertension - She is not regularly checking BP at home or outside of clinic.    - Current medications: amlodipine 5 mg, lisinopril 20 mg, hydrochlorothiazide 25 mg once daily, tolerating well without side effects - She is not currently symptomatic. - Pt denies headache, lightheadedness, dizziness, changes in vision, chest tightness/pressure, palpitations, leg swelling, sudden loss of speech or loss of consciousness. - She  reports no regular exercise routine outside of work. - Her diet is moderate in salt, moderate in fat, and moderate in carbohydrates.  - Patient reports increased life stressors.  Her father is ill and she is primary caregiver, working multiple jobs, taking care of children.  Patient denies anxiety, but is constantly responsible for the organization of her home.  She is not often taking time to take care of herself.   Obesity Patient has had another weight loss amount of 2 lbs for total of 4 lbs in last 3 months.  Patient states she is often a little too busy to eat.  She is eating lots of salads convenience foods on the go.  No regular exercise outside of work.   Social History   Tobacco Use  . Smoking status: Never Smoker  . Smokeless tobacco: Never Used  Substance Use Topics  . Alcohol use: No  . Drug use: No    Review of Systems Per HPI unless specifically indicated above     Objective:    BP (!) 174/80 (BP Location: Left Arm, Patient Position: Sitting, Cuff Size: Large)   Pulse 62   Temp 98.5 F (36.9 C) (Oral)   Resp 16   Ht 5\' 4"  (1.626 m)   Wt 234 lb 3.2 oz (106.2 kg)   SpO2 100%   BMI 40.20 kg/m   Wt Readings from Last 3 Encounters:  12/16/17 234 lb 3.2 oz (106.2 kg)  09/04/17 236 lb 3.2 oz (107.1 kg)  09/02/17 238 lb 9.6 oz (108.2 kg)    Physical Exam    Constitutional: She is oriented to person, place, and time. She appears well-developed and well-nourished. No distress.  HENT:  Head: Normocephalic and atraumatic.  Cardiovascular: Normal rate, regular rhythm, S1 normal, S2 normal, normal heart sounds and intact distal pulses.  Pulmonary/Chest: Effort normal and breath sounds normal. No respiratory distress.  Neurological: She is alert and oriented to person, place, and time.  Skin: Skin is warm and dry. Capillary refill takes less than 2 seconds.  Psychiatric: She has a normal mood and affect. Her behavior is normal. Judgment and thought content normal.  Vitals reviewed.   Results for orders placed or performed in visit on 10/13/17  Cologuard  Result Value Ref Range   Cologuard Negative       Assessment & Plan:   Problem List Items Addressed This Visit      Cardiovascular and Mediastinum   HBP (high blood pressure) - Primary    Currently uncontrolled hypertension on medications.  BP goal < 130/80.  Pt is not currently working on lifestyle modifications.  Taking medications tolerating well without side effects. No currently identified complications.  Plan: 1. Continue taking lisinopril 20 mg and hydrochlorothiazide 25 mg once daily.   - INCREASE amlodipine to 10 mg once daily 2. Last labs with normal Kidney function  3.  Encouraged heart healthy diet and increasing exercise to 30 minutes most days of the week, at least 10 minutes daily to take care of herself. 4. Check BP 1-2 x per week at home, keep log, and bring to clinic at next appointment.  Reviewed BP goals and when to call clinic. 5. Follow up 3 months.        Relevant Medications   amLODipine (NORVASC) 10 MG tablet     Other   Class 3 severe obesity with serious comorbidity and body mass index (BMI) of 40.0 to 44.9 in adult (HCC)    Stable to improving gradually.  Patient has lost 4 lbs in 3 months.  Briefly reviewed heart healthy diet.  Encourage patient to continue  healthy eating, increase physical activity.  Followup 3 months.       Other Visit Diagnoses    Need for prophylactic vaccination and inoculation against influenza       Relevant Orders   Flu Vaccine QUAD 6+ mos PF IM (Fluarix Quad PF)     Pt < age 55.  Needs annual influenza vaccine.  Plan: 1. Administer Quad flu today.  Meds ordered this encounter  Medications  . amLODipine (NORVASC) 10 MG tablet    Sig: Take 1 tablet (10 mg total) by mouth daily.    Dispense:  30 tablet    Refill:  5    Order Specific Question:   Supervising Provider    Answer:   Smitty CordsKARAMALEGOS, ALEXANDER J [[id number]]    Follow up plan: Return in about 3 months (around 03/17/2018) for hypertension.  Kelli McardleLauren Damere Brandenburg, DNP, AGPCNP-BC Adult Gerontology Primary Care Nurse Practitioner [facility] 12/16/2017, 8:04 AM

## 2017-12-16 NOTE — Patient Instructions (Addendum)
Shane CrutchMary C Askari,   Thank you for coming in to clinic today.  1. Continue lisinopril and hydrochlorothiazide without change.  2. INCREASE amlodipine to 10 mg once daily  3. BP goal < 130/80.  If you are regularly < 110/70, we will decrease your amlodipine back to 5 mg.  Call us to let us know.  Please schedule a follow-up appointment with Wilhelmina McardleLauren Delfin Squillace, AGNP. Return in about 3 months (around 03/17/2018) for hypertension.  If you have any other questions or concerns, please feel free to call the clinic or send a message through MyChart. You may also schedule an earlier appointment if necessary.  You will receive a survey after today's visit either digitally by e-mail or paper by [employer] mail. Your experiences and feedback matter to us.  Please respond so we know how we are doing as we provide care for you.   Wilhelmina McardleLauren Taelon Bendorf, DNP, AGNP-BC Adult Gerontology Nurse Practitioner [facility], [facility]

## 2017-12-16 NOTE — Assessment & Plan Note (Signed)
Currently uncontrolled hypertension on medications.  BP goal < 130/80.  Pt is not currently working on lifestyle modifications.  Taking medications tolerating well without side effects. No currently identified complications.  Plan: 1. Continue taking lisinopril 20 mg and hydrochlorothiazide 25 mg once daily.   - INCREASE amlodipine to 10 mg once daily 2. Last labs with normal Kidney function  3. Encouraged heart healthy diet and increasing exercise to 30 minutes most days of the week, at least 10 minutes daily to take care of herself. 4. Check BP 1-2 x per week at home, keep log, and bring to clinic at next appointment.  Reviewed BP goals and when to call clinic. 5. Follow up 3 months.

## 2017-12-16 NOTE — Assessment & Plan Note (Signed)
Stable to improving gradually.  Patient has lost 4 lbs in 3 months.  Briefly reviewed heart healthy diet.  Encourage patient to continue healthy eating, increase physical activity.  Followup 3 months.

## 2018-01-22 ENCOUNTER — Other Ambulatory Visit: Payer: Self-pay

## 2018-01-22 ENCOUNTER — Telehealth: Payer: Self-pay | Admitting: Nurse Practitioner

## 2018-01-22 NOTE — Telephone Encounter (Signed)
Pt got the flu shot in office but the hospital needs a copy.  Pt will stop by to pick up [phone]

## 2018-01-28 ENCOUNTER — Telehealth: Payer: Self-pay | Admitting: Nurse Practitioner

## 2018-01-28 NOTE — Telephone Encounter (Signed)
Attempted to contact the pt, no answer. LMOM to return my call.  

## 2018-01-28 NOTE — Telephone Encounter (Signed)
I attempted to contact the pt, no answer. I sent a mychart message also to the patient to notify her of [place] recommendations.

## 2018-01-28 NOTE — Telephone Encounter (Signed)
Patient will need to have an appointment.  Tooth ache can be caused bay many reasons.  I strongly suggest dentist visit if patient has a Education officer, community or dental insurance.

## 2018-01-28 NOTE — Telephone Encounter (Signed)
Pt has a tooth ache and asked if she could have something called in [phone]

## 2018-03-17 ENCOUNTER — Encounter: Payer: Self-pay | Admitting: Nurse Practitioner

## 2018-03-17 ENCOUNTER — Other Ambulatory Visit: Payer: Self-pay

## 2018-03-17 ENCOUNTER — Ambulatory Visit (INDEPENDENT_AMBULATORY_CARE_PROVIDER_SITE_OTHER): Payer: No Typology Code available for payment source | Admitting: Nurse Practitioner

## 2018-03-17 VITALS — BP 116/64 | HR 80 | Temp 98.5°F | Ht 64.0 in | Wt 237.2 lb

## 2018-03-17 DIAGNOSIS — Z6841 Body Mass Index (BMI) 40.0 and over, adult: Secondary | ICD-10-CM

## 2018-03-17 DIAGNOSIS — I10 Essential (primary) hypertension: Secondary | ICD-10-CM

## 2018-03-17 MED ORDER — AMLODIPINE BESYLATE 10 MG PO TABS: 10.0000 mg | ORAL_TABLET | Freq: Every day | ORAL | 1 refills | Status: DC

## 2018-03-17 NOTE — Patient Instructions (Addendum)
Shane CrutchmARY C Ceja,   Thank you for coming in to clinic today.  1. Continue amlodipine.  2. Work to maintain weight during the next couple of weeks. - Then, start your plans for healthier eating choices.  You may start eating a snack before the end of your work shift to allow a bit more time to make a healthier choice for your next meal.  Please schedule a follow-up appointment with Wilhelmina McardleLauren Chistine Dematteo, AGNP. Return in about 6 months (around 09/16/2018) for hypertension and 8 mos for annual physical.  If you have any other questions or concerns, please feel free to call the clinic or send a message through MyChart. You may also schedule an earlier appointment if necessary.  You will receive a survey after today's visit either digitally by e-mail or paper by [employer] mail. Your experiences and feedback matter to us.  Please respond so we know how we are doing as we provide care for you.   Wilhelmina McardleLauren Faizon Capozzi, DNP, AGNP-BC Adult Gerontology Nurse Practitioner [facility], [facility]

## 2018-03-17 NOTE — Progress Notes (Signed)
Subjective:    Patient ID: Kelli Chapman, female    DOB: 1960/06/25, 57 y.o.   MRN: [id number]  Kelli Chapman is a 57 y.o. female presenting on 03/17/2018 for Hypertension   HPI Hypertension - She is not checking BP at home or outside of clinic.   Readings usually 115-120s/70-80 - Current medications: amlodipine 10 mg once daily, tolerating well without side effects - She is not currently symptomatic. - Pt denies headache, lightheadedness, dizziness, changes in vision, chest tightness/pressure, palpitations, leg swelling, sudden loss of speech or loss of consciousness. - She  reports no regular exercise routine. - Her diet is moderate in salt, high in fat, and high in carbohydrates.  Obesity Has just returned from a cruise and notes weight gain due to increased oral intake.  Is working to make some healthier choices now, but is hard with holidays.   - Plans to get back to exercising - is member at [employer] [facility]. - Also plans to continue healthier choices in new year.  Social History   Tobacco Use  . Smoking status: Never Smoker  . Smokeless tobacco: Never Used  Substance Use Topics  . Alcohol use: No  . Drug use: No    Review of Systems Per HPI unless specifically indicated above     Objective:    BP 116/64 (BP Location: Left Arm, Patient Position: Sitting, Cuff Size: Large)   Pulse 80   Temp 98.5 F (36.9 C) (Oral)   Ht 5\' 4"  (1.626 m)   Wt 237 lb 3.2 oz (107.6 kg)   BMI 40.72 kg/m   Wt Readings from Last 3 Encounters:  03/17/18 237 lb 3.2 oz (107.6 kg)  12/16/17 234 lb 3.2 oz (106.2 kg)  09/04/17 236 lb 3.2 oz (107.1 kg)    Physical Exam Vitals signs reviewed.  Constitutional:      General: She is not in acute distress.    Appearance: She is well-developed.  HENT:     Head: Normocephalic and atraumatic.  Cardiovascular:     Rate and Rhythm: Normal rate and regular rhythm.     Heart sounds: Normal heart sounds, S1 normal and S2 normal.  Pulmonary:     Effort:  Pulmonary effort is normal. No respiratory distress.     Breath sounds: Normal breath sounds.  Skin:    General: Skin is warm and dry.     Capillary Refill: Capillary refill takes less than 2 seconds.  Neurological:     General: No focal deficit present.     Mental Status: She is alert and oriented to person, place, and time. Mental status is at baseline.  Psychiatric:        Mood and Affect: Mood normal.        Behavior: Behavior normal.        Thought Content: Thought content normal.        Judgment: Judgment normal.     Results for orders placed or performed in visit on 10/13/17  Cologuard  Result Value Ref Range   Cologuard Negative       Assessment & Plan:   Problem List Items Addressed This Visit      Cardiovascular and Mediastinum   HBP (high blood pressure) - Primary Controlled hypertension.  BP goal < 130/80.  Pt is working on lifestyle modifications to have a lower salt diet.  Taking medications tolerating well without side effects. No current complications.  Plan: 1. Continue taking amlodipine 10 mg once daily 2. Obtain  labs in 6 months.  3. Encouraged heart healthy diet and increasing exercise to 30 minutes most days of the week. 4. Check BP 1-2 x per week at home, keep log, and bring to clinic at next appointment. 5. Follow up 6 months.     Relevant Medications   amLODipine (NORVASC) 10 MG tablet     Other   Class 3 severe obesity with serious comorbidity and body mass index (BMI) of 40.0 to 44.9 in adult (HCC) Generally stable, but with increase over last 6 months and in last 2 weeks with travel.  Comorbid arthritis and hypertension.  Plan: 1. Encouraged patient to have a food log for baseline, make changes, then repeat food log with healthier choices to make these changes a habit.  Discussed substitution of baked vs fried, no sweets and fruit instead, snacking in later part of shift to allow time to make a healthier meal choice and not feeling incredibly  hungry. 2. Follow-up 6 months - 8 months with annual physical      Meds ordered this encounter  Medications  . amLODipine (NORVASC) 10 MG tablet    Sig: Take 1 tablet (10 mg total) by mouth daily.    Dispense:  90 tablet    Refill:  1    Order Specific Question:   Supervising Provider    Answer:   Smitty Cords [[id number]]    Follow up plan: Return in about 6 months (around 09/16/2018) for hypertension and 8 mos for annual physical.  Wilhelmina Mcardle, DNP, AGPCNP-BC Adult Gerontology Primary Care Nurse Practitioner [facility] 03/17/2018, 8:04 AM

## 2018-03-30 ENCOUNTER — Telehealth: Payer: Self-pay | Admitting: Nurse Practitioner

## 2018-03-30 ENCOUNTER — Ambulatory Visit (INDEPENDENT_AMBULATORY_CARE_PROVIDER_SITE_OTHER): Payer: No Typology Code available for payment source | Admitting: Nurse Practitioner

## 2018-03-30 ENCOUNTER — Other Ambulatory Visit: Payer: Self-pay

## 2018-03-30 ENCOUNTER — Encounter: Payer: Self-pay | Admitting: Nurse Practitioner

## 2018-03-30 VITALS — BP 112/66 | HR 79 | Temp 98.4°F | Resp 18 | Ht 64.0 in | Wt 231.6 lb

## 2018-03-30 DIAGNOSIS — J111 Influenza due to unidentified influenza virus with other respiratory manifestations: Secondary | ICD-10-CM

## 2018-03-30 MED ORDER — ALBUTEROL SULFATE 108 (90 BASE) MCG/ACT IN AEPB: 1.0000 | INHALATION_SPRAY | RESPIRATORY_TRACT | 0 refills | Status: DC | PRN

## 2018-03-30 NOTE — Telephone Encounter (Signed)
Pt said she did the e-visit on Sunday and was prescribed tamiflu.  She not any better and asked to have antibiotic sent to phamacy.  Her call back is [phone]

## 2018-03-30 NOTE — Patient Instructions (Addendum)
Shane CrutchmARY C Garrelts,   Thank you for coming in to clinic today.  1. START mucinex or guaifenesin twice daily to loosen secretions.   2. START albuterol 1-2 puffs every 4 hours as needed for wheezing and shortness of breath.  Please schedule a follow-up appointment with Wilhelmina McardleLauren Cheria Sadiq, AGNP. Return if symptoms worsen or fail to improve.   If you have any other questions or concerns, please feel free to call the clinic or send a message through MyChart. You may also schedule an earlier appointment if necessary.  You will receive a survey after today's visit either digitally by e-mail or paper by [employer] mail. Your experiences and feedback matter to us.  Please respond so we know how we are doing as we provide care for you.   Wilhelmina McardleLauren Brylea Pita, DNP, AGNP-BC Adult Gerontology Nurse Practitioner [facility], [facility]

## 2018-03-30 NOTE — Telephone Encounter (Signed)
The pt called complaining of fever 101, chills, cold, headache, sore throat, and coughing. X 4 days. She did a E-visit on Sunday and was prescribed Tamiflu. She's currently taking the Tamiflu with no improvement. The pt states a lot of coughing, but she feel like she cannot get it up.She's called requesting that you send a abx to her pharmacy. Please advise

## 2018-03-30 NOTE — Telephone Encounter (Signed)
Please request that patient come in at the 4pm appointment today.  It is best to have an office evaluation.

## 2018-03-30 NOTE — Progress Notes (Signed)
Subjective:    Patient ID: Kelli Chapman, female    DOB: 23-Feb-1961, [age] y.o.   MRN: [id number]  Kelli CrutchMary C Schaible is a 57 y.o. female presenting on 03/30/2018 for Cough (fever, sore throat, coughing, fatigue x 4 days )   HPI Flu with prolonged cough and mild shortness of breath Patient was seen for E-visit through mychart 4 days ago.  She has been taking Tamiflu with some mild improvement over last 24 hours.  Notes her fever broke 3 am. Has had nausea, headache, now continues with bad chest congestion is now bothersome and creating significant cough. - Decreased appetite, but has been able to eat a little today.  - Does feel some mild wheezing at time, especially before cough.  Social History   Tobacco Use  . Smoking status: Never Smoker  . Smokeless tobacco: Never Used  Substance Use Topics  . Alcohol use: No  . Drug use: No    Review of Systems Per HPI unless specifically indicated above     Objective:    BP 112/66 (BP Location: Left Arm, Patient Position: Sitting, Cuff Size: Large)   Pulse 79   Temp 98.4 F (36.9 C) (Oral)   Resp 18   Ht 5\' 4"  (1.626 m)   Wt 231 lb 9.6 oz (105.1 kg)   SpO2 99%   BMI 39.75 kg/m   Wt Readings from Last 3 Encounters:  03/30/18 231 lb 9.6 oz (105.1 kg)  03/17/18 237 lb 3.2 oz (107.6 kg)  12/16/17 234 lb 3.2 oz (106.2 kg)    Physical Exam Vitals signs reviewed.  Constitutional:      General: She is not in acute distress.    Appearance: She is well-developed.  HENT:     Head: Normocephalic and atraumatic.     Right Ear: Hearing, ear canal and external ear normal. Tympanic membrane is retracted.     Left Ear: Hearing, ear canal and external ear normal. Tympanic membrane is retracted.     Nose: Mucosal edema and rhinorrhea present. Rhinorrhea is clear.     Right Sinus: No maxillary sinus tenderness or frontal sinus tenderness.     Left Sinus: No maxillary sinus tenderness or frontal sinus tenderness.  Eyes:     General: Lids are normal.    Neck:     Musculoskeletal: Full passive range of motion without pain, normal range of motion and neck supple.  Cardiovascular:     Rate and Rhythm: Normal rate and regular rhythm.     Pulses:          Radial pulses are 2+ on the right side and 2+ on the left side.       Posterior tibial pulses are 1+ on the right side and 1+ on the left side.     Heart sounds: Normal heart sounds, S1 normal and S2 normal.  Pulmonary:     Effort: Pulmonary effort is normal. No respiratory distress.     Breath sounds: Normal air entry. Decreased breath sounds and wheezing (throughout) present.  Musculoskeletal:     Right lower leg: No edema.     Left lower leg: No edema.  Lymphadenopathy:     Cervical: No cervical adenopathy.  Skin:    General: Skin is warm and dry.     Capillary Refill: Capillary refill takes less than 2 seconds.  Neurological:     Mental Status: She is alert and oriented to person, place, and time.  Psychiatric:  Attention and Perception: Attention normal.        Mood and Affect: Mood and affect normal.        Behavior: Behavior normal. Behavior is cooperative.    Results for orders placed or performed in visit on 10/13/17  Cologuard  Result Value Ref Range   Cologuard Negative       Assessment & Plan:   Problem List Items Addressed This Visit    None    Visit Diagnoses    Bronchitis with flu    -  Primary   Relevant Medications   Albuterol Sulfate 108 (90 Base) MCG/ACT AEPB      Clinically consistent with flu and improvement with tamiflu, but now with respiratory complications. - Did not receive influenza vaccine this season   Plan: 1. Continue Tamiflu until completed on Friday. 2. Supportive care as advised with NSAID / Tylenol PRN fever/myalgias, improve hydration, may take OTC Cold/Flu meds 3. START albuterol 1-2 puffs q4 hr prn shortness of breath and wheezing for up to 14 days. - START mucinex for respiratory congestion, mucinex-DM if needed for  cough 4. Return criteria given if significant worsening, consider post-influenza complications, otherwise follow-up if needed  Meds ordered this encounter  Medications  . Albuterol Sulfate 108 (90 Base) MCG/ACT AEPB    Sig: Inhale 1-2 puffs into the lungs every 4 (four) hours as needed for up to 14 days (wheezing or shortness of breath).    Dispense:  1 each    Refill:  0    Order Specific Question:   Supervising Provider    Answer:   Smitty CordsKARAMALEGOS, ALEXANDER J [[id number]]    Follow up plan: Return if symptoms worsen or fail to improve.  Wilhelmina McardleLauren Kayonna Lawniczak, DNP, AGPCNP-BC Adult Gerontology Primary Care Nurse Practitioner [facility] 03/30/2018, 2:41 PM

## 2018-09-15 ENCOUNTER — Ambulatory Visit: Payer: No Typology Code available for payment source | Admitting: Nurse Practitioner

## 2018-11-17 ENCOUNTER — Encounter: Payer: No Typology Code available for payment source | Admitting: Nurse Practitioner

## 2019-02-03 ENCOUNTER — Other Ambulatory Visit: Payer: Self-pay | Admitting: Nurse Practitioner

## 2019-02-03 DIAGNOSIS — I10 Essential (primary) hypertension: Secondary | ICD-10-CM

## 2019-02-08 ENCOUNTER — Emergency Department
Admission: EM | Admit: 2019-02-08 | Discharge: 2019-02-08 | Disposition: A | Discharge status: Home / Self Care | Payer: No Typology Code available for payment source | Attending: Emergency Medicine | Admitting: Emergency Medicine

## 2019-02-08 ENCOUNTER — Other Ambulatory Visit: Payer: Self-pay

## 2019-02-08 ENCOUNTER — Emergency Department: Payer: No Typology Code available for payment source

## 2019-02-08 DIAGNOSIS — Y92410 Unspecified street and highway as the place of occurrence of the external cause: Secondary | ICD-10-CM | POA: Insufficient documentation

## 2019-02-08 DIAGNOSIS — Y999 Unspecified external cause status: Secondary | ICD-10-CM | POA: Diagnosis not present

## 2019-02-08 DIAGNOSIS — M542 Cervicalgia: Secondary | ICD-10-CM | POA: Insufficient documentation

## 2019-02-08 DIAGNOSIS — R0789 Other chest pain: Secondary | ICD-10-CM | POA: Diagnosis not present

## 2019-02-08 DIAGNOSIS — I10 Essential (primary) hypertension: Secondary | ICD-10-CM | POA: Diagnosis not present

## 2019-02-08 DIAGNOSIS — Y9389 Activity, other specified: Secondary | ICD-10-CM | POA: Diagnosis not present

## 2019-02-08 DIAGNOSIS — Z79899 Other long term (current) drug therapy: Secondary | ICD-10-CM | POA: Diagnosis not present

## 2019-02-08 MED ORDER — HYDROCODONE-ACETAMINOPHEN 5-325 MG PO TABS
1.0000 | ORAL_TABLET | Freq: Once | ORAL | Status: AC
  Administered 2019-02-08: 1 via ORAL
  Filled 2019-02-08: qty 1

## 2019-02-08 MED ORDER — ONDANSETRON 4 MG PO TBDP
4.0000 mg | ORAL_TABLET | Freq: Once | ORAL | Status: AC
  Administered 2019-02-08: 4 mg via ORAL
  Filled 2019-02-08: qty 1

## 2019-02-08 MED ORDER — MELOXICAM 15 MG PO TABS: 15.0000 mg | ORAL_TABLET | Freq: Every day | ORAL | 1 refills | Status: AC

## 2019-02-08 MED ORDER — METHOCARBAMOL 500 MG PO TABS: 500.0000 mg | ORAL_TABLET | Freq: Three times a day (TID) | ORAL | 0 refills | Status: AC | PRN
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
## 2019-02-08 NOTE — ED Notes (Signed)
Patient transported to X-ray 

## 2019-06-15 ENCOUNTER — Other Ambulatory Visit: Payer: Self-pay

## 2019-06-15 ENCOUNTER — Ambulatory Visit (INDEPENDENT_AMBULATORY_CARE_PROVIDER_SITE_OTHER): Payer: No Typology Code available for payment source | Admitting: Family Medicine

## 2019-06-15 ENCOUNTER — Encounter: Payer: Self-pay | Admitting: Family Medicine

## 2019-06-15 VITALS — BP 144/86 | HR 67 | Temp 97.3°F | Ht 64.0 in | Wt 223.0 lb

## 2019-06-15 DIAGNOSIS — I10 Essential (primary) hypertension: Secondary | ICD-10-CM

## 2019-06-15 DIAGNOSIS — E785 Hyperlipidemia, unspecified: Secondary | ICD-10-CM

## 2019-06-15 DIAGNOSIS — R590 Localized enlarged lymph nodes: Secondary | ICD-10-CM

## 2019-06-15 DIAGNOSIS — Z6838 Body mass index (BMI) 38.0-38.9, adult: Secondary | ICD-10-CM

## 2019-06-15 DIAGNOSIS — M542 Cervicalgia: Secondary | ICD-10-CM

## 2019-06-15 DIAGNOSIS — Z1231 Encounter for screening mammogram for malignant neoplasm of breast: Secondary | ICD-10-CM | POA: Diagnosis not present

## 2019-06-15 MED ORDER — AMLODIPINE BESYLATE 10 MG PO TABS: 10.0000 mg | ORAL_TABLET | Freq: Every day | ORAL | 1 refills | Status: DC

## 2019-06-15 NOTE — Patient Instructions (Signed)
As we discussed, your blood pressure was slightly elevated today, we want to aim for this to be under 130/80.  I have sent in a refill on your blood pressure medication. Take this as directed.  Have your labs drawn and we will contact you with the results.  For Mammogram screening for breast cancer and repeat right breast ultrasound  Call the [facility] below anytime to schedule your own appointment now that order has been placed.  [facility] [place], [place] [address] Phone: [phone]  [facility] [address] [place], [place] [address] Phone: [phone]   Try to get exercise a minimum of 30 minutes per day at least 5 days per week as well as  adequate water intake all while measuring blood pressure a few times per week.  Keep a blood pressure log and bring back to clinic at your next visit.  If your readings are consistently over 140/90 to contact our office/send me a MyChart message and we will see you sooner.  Can try DASH and Mediterranean diet options, avoiding processed foods, lowering sodium intake, avoiding pork products, and eating a plant based diet for optimal health.  Education and discussion with patient regarding hypertension as well as the effects on the organs and body.  Specifically, we spoke about kidney disease, kidney failure, heart attack, stroke and up to and including death, as likely outcomes if non-compliant with blood pressure regulation.  Discussed how all of these habits are attached to each other and each has the effect on each other.  Having high HDL (good) cholesterol and low triglyceride levels can reduce your risk of heart attack and stroke.  The following steps can be taken to reduce triglyceride levels and increase HDL (good) cholesterol levels.  Avoid or limit alcohol Alcohol significantly raises triglyceride levels.  If your triglycerides are higher than  150, avoid or limit alcohol.  Limit fruit juice and dried fruits Even fresh fruit juice contains a large amount of fructose sugar and can increase triglyceride and blood sugar levels.  Fruit juice and dried fruit are also high in calories and can add unwanted pounds.  Replace fruit juice and dried fruits with fresh fruit that has more fiber and fewer calories.  Limit non-diet soft drinks and sports drinks Non-diet soft drinks and sport drinks contain as many as 12 teaspoons of sugar per serving.  This sugar can increase triglyceride and blood sugar levels.  Non-diet soft drinks and sport drinks also contain calories that add unwanted pounds.  Replace non-diet soft drinks and sports drinks with water, unsweetened tea, diet colas or beverages sweetened with Nutra-Sweet.  Limit concentrated sweets Even fat free or low fat sweets can raise your triglyceride and/or blood sugar levels.  Concentrated sweets include sugar, honey, jelly, candy, cookies, cakes, pies, pastries, frozen desserts, puddings, and sugar sweetened cereals.  Replace concentrated sweets with high fiber foods such as fruit, low fat yogurt, sugar free gelatin and low fat puddings.  Limit refined carbohydrates Refined carbohydrates include white flour, white bread, white rice, and some pasta.  Limit portion sizes of these foods or replace them with whole wheat bread, lentils, whole grains, brown rice, and spinach or whole-wheat pastas.  Reduce your weight, if needed Even a 5-10 pound weight loss can cause your triglyceride level to decrease significantly.  You can lose 1-2 pounds per week by limiting your portion sizes and exercising 5-6 times per week.  Include  monounsaturated fats in your diet.  Include Monounsaturated fats in your diet Moderate amounts of monounsaturated fat can raise your HDL (good) cholesterol.  Sources of monounsaturated fat include olive oil, canola oil, peanut oil, peanuts, peanut butter, cashews, olives, and  avocados.  Choose peanut butter that does not have sugar in the ingredient list.  Since these foods are high in calories, serving sizes should be moderate.  Include fish in your diet Fish is low in saturated fat and rich in Omega-3 fatty acids.  Good choices include mackerel, salmon, herring, bluefish, lake trout, tuna, and sardines canned in oil.  If you smoke, quit Smoking lowers HDL (good) cholesterol and raises triglycerides.  When you quit smoking your HDL (good) cholesterol increases and triglycerides decrease.  Stay active Exercise raises your HDL (good) cholesterol.  Walk, ride a bike, or swim for at least 20 minutes, 3-5 times per week.  Ideally, you should try to exercise for 30-60 minutes most days of the week.  Check with your Healthcare Provider before beginning an exercise program.  We will plan to see you back in 3 months for blood pressure recheck and review of your cholesterol management  You will receive a survey after today's visit either digitally by e-mail or paper by USPS mail. Your experiences and feedback matter to [place].  Please respond so we know how we are doing as we provide care for you.  Call us with any questions/concerns/needs.  It is my goal to be available to you for your health concerns.  Thanks for choosing me to be a partner in your healthcare needs!  Charlaine Dalton, FNP-C Family Nurse Practitioner [facility] Phone: [phone]

## 2019-06-15 NOTE — Assessment & Plan Note (Signed)
Currently uncontrolled hypertension.  BP goal < 130/80.  Pt is not working on lifestyle modifications.  Has recently run out of the amlodipine prescription but otherwise is taking medications tolerating well without side effects.   Plan: 1. Continue taking amlodipine 10mg  daily 2. Obtain labs ordered today  3. Encouraged heart healthy diet and increasing exercise to 30 minutes most days of the week, going no more than 2 days in a row without exercise. 4. Check BP 1-2 x per week at home, keep log, and bring to clinic at next appointment. 5. Follow up 3 months.
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
## 2019-06-16 LAB — LIPID PANEL
Cholesterol: 206 mg/dL — ABNORMAL HIGH (ref <200)
HDL: 49 mg/dL — ABNORMAL LOW (ref >=50)
LDL Cholesterol (Calc): 139 mg/dL (calc) — ABNORMAL HIGH
Non-HDL Cholesterol (Calc): 157 mg/dL (calc) — ABNORMAL HIGH (ref <130)
Total CHOL/HDL Ratio: 4.2 (calc) (ref <5.0)
Triglycerides: 85 mg/dL (ref <150)

## 2019-06-16 LAB — CBC WITH DIFFERENTIAL/PLATELET
Absolute Monocytes: 536 cells/uL (ref 200–950)
Basophils Absolute: 50 cells/uL (ref 0–200)
Basophils Relative: 0.8 %
Eosinophils Absolute: 50 cells/uL (ref 15–500)
Eosinophils Relative: 0.8 %
HCT: 41.7 % (ref 35.0–45.0)
Hemoglobin: 13.2 g/dL (ref 11.7–15.5)
Lymphs Abs: 2703 cells/uL (ref 850–3900)
MCH: 25.6 pg — ABNORMAL LOW (ref 27.0–33.0)
MCHC: 31.7 g/dL — ABNORMAL LOW (ref 32.0–36.0)
MCV: 80.8 fL (ref 80.0–100.0)
MPV: 10.4 fL (ref 7.5–12.5)
Monocytes Relative: 8.5 %
Neutro Abs: 2961 cells/uL (ref 1500–7800)
Neutrophils Relative %: 47 %
Platelets: 318 10*3/uL (ref 140–400)
RBC: 5.16 10*6/uL — ABNORMAL HIGH (ref 3.80–5.10)
RDW: 14 % (ref 11.0–15.0)
Total Lymphocyte: 42.9 %
WBC: 6.3 10*3/uL (ref 3.8–10.8)

## 2019-06-16 LAB — COMPLETE METABOLIC PANEL WITH GFR
AG Ratio: 1.4 (calc) (ref 1.0–2.5)
ALT: 11 U/L (ref 6–29)
AST: 15 U/L (ref 10–35)
Albumin: 4 g/dL (ref 3.6–5.1)
Alkaline phosphatase (APISO): 92 U/L (ref 37–153)
BUN: 13 mg/dL (ref 7–25)
CO2: 25 mmol/L (ref 20–32)
Calcium: 9.4 mg/dL (ref 8.6–10.4)
Chloride: 106 mmol/L (ref 98–110)
Creat: 0.81 mg/dL (ref 0.50–1.05)
GFR, Est African American: 93 mL/min/{1.73_m2} (ref >=60)
GFR, Est Non African American: 81 mL/min/{1.73_m2} (ref >=60)
Globulin: 2.8 g/dL (calc) (ref 1.9–3.7)
Glucose, Bld: 95 mg/dL (ref 65–99)
Potassium: 4.3 mmol/L (ref 3.5–5.3)
Sodium: 143 mmol/L (ref 135–146)
Total Bilirubin: 0.3 mg/dL (ref 0.2–1.2)
Total Protein: 6.8 g/dL (ref 6.1–8.1)

## 2019-06-16 LAB — HEMOGLOBIN A1C
Hgb A1c MFr Bld: 5.7 % of total Hgb — ABNORMAL HIGH (ref <5.7)
Mean Plasma Glucose: 117 (calc)
eAG (mmol/L): 6.5 (calc)

## 2019-06-16 LAB — THYROID PANEL WITH TSH
Free Thyroxine Index: 2.9 (ref 1.4–3.8)
T3 Uptake: 27 % (ref 22–35)
T4, Total: 10.8 ug/dL (ref 5.1–11.9)
TSH: 1.25 mIU/L (ref 0.40–4.50)

## 2019-06-16 NOTE — Progress Notes (Signed)
Labs are within normal limits. Cholesterol labs are  still elevated, continue with the atorvastatin 20mg  daily

## 2019-06-17 ENCOUNTER — Other Ambulatory Visit: Payer: Self-pay

## 2019-06-17 ENCOUNTER — Ambulatory Visit: Payer: Self-pay | Admitting: Family Medicine

## 2019-06-17 DIAGNOSIS — E7800 Pure hypercholesterolemia, unspecified: Secondary | ICD-10-CM

## 2019-06-17 MED ORDER — ATORVASTATIN CALCIUM 20 MG PO TABS: 20.0000 mg | ORAL_TABLET | Freq: Every day | ORAL | 3 refills | Status: DC

## 2019-06-30 ENCOUNTER — Other Ambulatory Visit: Payer: Self-pay | Admitting: Family Medicine

## 2019-06-30 DIAGNOSIS — Z1231 Encounter for screening mammogram for malignant neoplasm of breast: Secondary | ICD-10-CM

## 2019-06-30 NOTE — Progress Notes (Signed)
Updated MMG order

## 2019-08-16 ENCOUNTER — Ambulatory Visit
Admission: RE | Admit: 2019-08-16 | Discharge: 2019-08-16 | Disposition: A | Discharge status: Home / Self Care | Payer: No Typology Code available for payment source | Source: Ambulatory Visit | Attending: Family Medicine | Admitting: Family Medicine

## 2019-08-16 DIAGNOSIS — Z1231 Encounter for screening mammogram for malignant neoplasm of breast: Secondary | ICD-10-CM | POA: Insufficient documentation

## 2019-09-21 ENCOUNTER — Other Ambulatory Visit: Payer: Self-pay

## 2019-09-21 ENCOUNTER — Encounter: Payer: Self-pay | Admitting: Family Medicine

## 2019-09-21 ENCOUNTER — Ambulatory Visit (INDEPENDENT_AMBULATORY_CARE_PROVIDER_SITE_OTHER): Payer: No Typology Code available for payment source | Admitting: Family Medicine

## 2019-09-21 ENCOUNTER — Other Ambulatory Visit: Payer: Self-pay | Admitting: Family Medicine

## 2019-09-21 VITALS — BP 128/82 | HR 65 | Temp 97.1°F | Ht 64.0 in | Wt 223.2 lb

## 2019-09-21 DIAGNOSIS — R8290 Unspecified abnormal findings in urine: Secondary | ICD-10-CM | POA: Diagnosis not present

## 2019-09-21 DIAGNOSIS — I10 Essential (primary) hypertension: Secondary | ICD-10-CM

## 2019-09-21 DIAGNOSIS — E785 Hyperlipidemia, unspecified: Secondary | ICD-10-CM | POA: Diagnosis not present

## 2019-09-21 DIAGNOSIS — E7800 Pure hypercholesterolemia, unspecified: Secondary | ICD-10-CM | POA: Diagnosis not present

## 2019-09-21 LAB — POCT URINALYSIS DIPSTICK
Bilirubin, UA: NEGATIVE
Glucose, UA: NEGATIVE
Ketones, UA: NEGATIVE
Leukocytes, UA: NEGATIVE
Nitrite, UA: NEGATIVE
Protein, UA: NEGATIVE
Spec Grav, UA: 1.01 (ref 1.010–1.025)
Urobilinogen, UA: 0.2 E.U./dL
pH, UA: 7 (ref 5.0–8.0)

## 2019-09-21 MED ORDER — ATORVASTATIN CALCIUM 20 MG PO TABS: 20.0000 mg | ORAL_TABLET | Freq: Every day | ORAL | 3 refills | Status: DC

## 2019-09-21 MED ORDER — AMLODIPINE BESYLATE 10 MG PO TABS: 10.0000 mg | ORAL_TABLET | Freq: Every day | ORAL | 1 refills | Status: DC

## 2019-09-21 NOTE — Assessment & Plan Note (Signed)
Controlled hypertension.  Pt is working on lifestyle modifications.  Taking medications tolerating well without side effects. Complications: Obesity, hyperlipidemia  Plan: 1. Continue taking amlodipine 10mg  daily 2. Obtain labs before next visit in 6 months  3. Encouraged heart healthy diet and increasing exercise to 30 minutes most days of the week, going no more than 2 days in a row without exercise. 4. Check BP 1-2 x per week at home, keep log, and bring to clinic at next appointment. 5. Follow up 6 months.

## 2019-09-21 NOTE — Assessment & Plan Note (Signed)
Continue taking atorvastatin 20mg .  Will repeat labs at next visit in 6 months.

## 2019-09-21 NOTE — Progress Notes (Signed)
Subjective:    Patient ID: Kelli Chapman, female    DOB: 1960/05/15, 59 y.o.   MRN: [id number]  Kelli Chapman is a 59 y.o. female presenting on 09/21/2019 for Hypertension   HPI   Hypertension - She is checking BP at home or outside of clinic.  Readings 120's/80's - Current medications: amlodipine 10mg  daily, tolerating well without side effects - She is not currently symptomatic. - Pt denies headache, lightheadedness, dizziness, changes in vision, chest tightness/pressure, palpitations, leg swelling, sudden loss of speech or loss of consciousness. - She  reports no regular exercise routine. - Her diet is moderate in salt, moderate in fat, and moderate in carbohydrates.   Depression screen [facility] 2/9 06/15/2019 07/29/2017 01/17/2015  Decreased Interest 0 0 0  Down, Depressed, Hopeless 0 0 0  PHQ - 2 Score 0 0 0    Social History   Tobacco Use  . Smoking status: Never Smoker  . Smokeless tobacco: Never Used  Vaping Use  . Vaping Use: Never used  Substance Use Topics  . Alcohol use: No  . Drug use: No    Review of Systems  Constitutional: Negative.   HENT: Negative.   Eyes: Negative.   Respiratory: Negative.   Cardiovascular: Negative.   Gastrointestinal: Negative.   Endocrine: Negative.   Genitourinary: Negative.   Musculoskeletal: Negative.   Skin: Negative.   Allergic/Immunologic: Negative.   Neurological: Negative.   Hematological: Negative.   Psychiatric/Behavioral: Negative.    Per HPI unless specifically indicated above     Objective:    BP 128/82 (BP Location: Left Arm, Patient Position: Sitting, Cuff Size: Large)   Pulse 65   Temp (!) 97.1 F (36.2 C) (Temporal)   Ht 5\' 4"  (1.626 m)   Wt 223 lb 3.2 oz (101.2 kg)   BMI 38.31 kg/m   Wt Readings from Last 3 Encounters:  09/21/19 223 lb 3.2 oz (101.2 kg)  06/15/19 223 lb (101.2 kg)  02/08/19 233 lb (105.7 kg)    Physical Exam Vitals reviewed.  Constitutional:      General: She is not in acute  distress.    Appearance: Normal appearance. She is obese. She is not ill-appearing or toxic-appearing.  HENT:     Head: Normocephalic and atraumatic.     Nose:     Comments: Lizbeth Bark is in place, covering mouth and nose Eyes:     General: Lids are normal. Vision grossly intact.        Right eye: No discharge.        Left eye: No discharge.     Extraocular Movements: Extraocular movements intact.     Conjunctiva/sclera: Conjunctivae normal.     Pupils: Pupils are equal, round, and reactive to light.  Cardiovascular:     Rate and Rhythm: Normal rate and regular rhythm.     Pulses: Normal pulses.     Heart sounds: Normal heart sounds. No murmur heard.  No friction rub. No gallop.   Pulmonary:     Effort: Pulmonary effort is normal. No respiratory distress.     Breath sounds: Normal breath sounds.  Musculoskeletal:     Right lower leg: No edema.     Left lower leg: No edema.  Skin:    General: Skin is warm and dry.     Capillary Refill: Capillary refill takes less than 2 seconds.  Neurological:     General: No focal deficit present.     Mental Status: She is alert and oriented  to person, place, and time.     Cranial Nerves: No cranial nerve deficit.     Sensory: No sensory deficit.     Motor: No weakness.     Coordination: Coordination normal.     Gait: Gait normal.  Psychiatric:        Attention and Perception: Attention and perception normal.        Mood and Affect: Mood and affect normal.        Speech: Speech normal.        Behavior: Behavior normal. Behavior is cooperative.        Thought Content: Thought content normal.        Cognition and Memory: Cognition and memory normal.        Judgment: Judgment normal.    Results for orders placed or performed in visit on 06/15/19  CBC with Differential  Result Value Ref Range   WBC 6.3 3.8 - 10.8 Thousand/uL   RBC 5.16 (H) 3.80 - 5.10 Million/uL   Hemoglobin 13.2 11.7 - 15.5 g/dL   HCT 46.5 35 - 45 %   MCV 80.8 80.0 -  100.0 fL   MCH 25.6 (L) 27.0 - 33.0 pg   MCHC 31.7 (L) 32.0 - 36.0 g/dL   RDW 68.1 27.5 - 17.0 %   Platelets 318 140 - 400 Thousand/uL   MPV 10.4 7.5 - 12.5 fL   Neutro Abs 2,961 1,500 - 7,800 cells/uL   Lymphs Abs 2,703 850 - 3,900 cells/uL   Absolute Monocytes 536 200 - 950 cells/uL   Eosinophils Absolute 50 15 - 500 cells/uL   Basophils Absolute 50 0 - 200 cells/uL   Neutrophils Relative % 47 %   Total Lymphocyte 42.9 %   Monocytes Relative 8.5 %   Eosinophils Relative 0.8 %   Basophils Relative 0.8 %  COMPLETE METABOLIC PANEL WITH GFR  Result Value Ref Range   Glucose, Bld 95 65 - 99 mg/dL   BUN 13 7 - 25 mg/dL   Creat 0.17 4.94 - 4.96 mg/dL   GFR, Est Non African American 81 > OR = 60 mL/min/1.69m2   GFR, Est African American 93 > OR = 60 mL/min/1.60m2   BUN/Creatinine Ratio NOT APPLICABLE 6 - 22 (calc)   Sodium 143 135 - 146 mmol/L   Potassium 4.3 3.5 - 5.3 mmol/L   Chloride 106 98 - 110 mmol/L   CO2 25 20 - 32 mmol/L   Calcium 9.4 8.6 - 10.4 mg/dL   Total Protein 6.8 6.1 - 8.1 g/dL   Albumin 4.0 3.6 - 5.1 g/dL   Globulin 2.8 1.9 - 3.7 g/dL (calc)   AG Ratio 1.4 1.0 - 2.5 (calc)   Total Bilirubin 0.3 0.2 - 1.2 mg/dL   Alkaline phosphatase (APISO) 92 37 - 153 U/L   AST 15 10 - 35 U/L   ALT 11 6 - 29 U/L  Lipid Profile  Result Value Ref Range   Cholesterol 206 (H) <200 mg/dL   HDL 49 (L) > OR = 50 mg/dL   Triglycerides 85 <759 mg/dL   LDL Cholesterol (Calc) 139 (H) mg/dL (calc)   Total CHOL/HDL Ratio 4.2 <5.0 (calc)   Non-HDL Cholesterol (Calc) 157 (H) <130 mg/dL (calc)  Thyroid Panel With TSH  Result Value Ref Range   T3 Uptake 27 22 - 35 %   T4, Total 10.8 5.1 - 11.9 mcg/dL   Free Thyroxine Index 2.9 1.4 - 3.8   TSH 1.25 0.40 - 4.50 mIU/L  HgB A1c  Result Value Ref Range   Hgb A1c MFr Bld 5.7 (H) <5.7 % of total Hgb   Mean Plasma Glucose 117 (calc)   eAG (mmol/L) 6.5 (calc)      Assessment & Plan:   Problem List Items Addressed This Visit       Cardiovascular and Mediastinum   HBP (high blood pressure)    Controlled hypertension.  Pt is working on lifestyle modifications.  Taking medications tolerating well without side effects. Complications: Obesity, hyperlipidemia  Plan: 1. Continue taking amlodipine 10mg  daily 2. Obtain labs before next visit in 6 months  3. Encouraged heart healthy diet and increasing exercise to 30 minutes most days of the week, going no more than 2 days in a row without exercise. 4. Check BP 1-2 x per week at home, keep log, and bring to clinic at next appointment. 5. Follow up 6 months.         Relevant Medications   atorvastatin (LIPITOR) 20 MG tablet   amLODipine (NORVASC) 10 MG tablet     Other   Hyperlipidemia    Continue taking atorvastatin 20mg .  Will repeat labs at next visit in 6 months.      Relevant Medications   atorvastatin (LIPITOR) 20 MG tablet   amLODipine (NORVASC) 10 MG tablet      Meds ordered this encounter  Medications  . atorvastatin (LIPITOR) 20 MG tablet    Sig: Take 1 tablet (20 mg total) by mouth daily.    Dispense:  90 tablet    Refill:  3  . amLODipine (NORVASC) 10 MG tablet    Sig: Take 1 tablet (10 mg total) by mouth daily.    Dispense:  90 tablet    Refill:  1      Follow up plan: Return in about 6 months (around 03/22/2020) for HTN & HLD F/U.   , FNP Family Nurse Practitioner [facility] 09/21/2019, 8:28 AM

## 2019-09-21 NOTE — Patient Instructions (Signed)
Continue your medication as directed.  I have sent refills to your pharmacy today.  Try to get exercise a minimum of 30 minutes per day at least 5 days per week as well as  adequate water intake all while measuring blood pressure a few times per week.  Keep a blood pressure log and bring back to clinic at your next visit.  If your readings are consistently over 130/80 to contact our office/send me a MyChart message and we will see you sooner.  Can try DASH and Mediterranean diet options, avoiding processed foods, lowering sodium intake, avoiding pork products, and eating a plant based diet for optimal health.  We will plan to see you back in 6 months for hypertension and hyperlipidemia follow up visit  You will receive a survey after today's visit either digitally by e-mail or paper by USPS mail. Your experiences and feedback matter to [place].  Please respond so we know how we are doing as we provide care for you.  Call us with any questions/concerns/needs.  It is my goal to be available to you for your health concerns.  Thanks for choosing me to be a partner in your healthcare needs!  Charlaine Dalton, FNP-C Family Nurse Practitioner [facility] Phone: [phone]

## 2019-09-21 NOTE — Addendum Note (Signed)
Addended by: Lonna Cobb on: 09/21/2019 08:46 AM   Modules accepted: Orders

## 2019-09-22 LAB — URINALYSIS, MICROSCOPIC ONLY
Bacteria, UA: NONE SEEN /HPF
Hyaline Cast: NONE SEEN /LPF
RBC / HPF: NONE SEEN /HPF (ref 0–2)
WBC, UA: NONE SEEN /HPF (ref 0–5)

## 2020-08-22 ENCOUNTER — Other Ambulatory Visit: Payer: Self-pay

## 2020-08-23 ENCOUNTER — Other Ambulatory Visit: Payer: Self-pay

## 2020-08-24 ENCOUNTER — Other Ambulatory Visit: Payer: Self-pay

## 2020-08-24 ENCOUNTER — Other Ambulatory Visit: Payer: Self-pay | Admitting: Internal Medicine

## 2020-08-24 DIAGNOSIS — E7800 Pure hypercholesterolemia, unspecified: Secondary | ICD-10-CM

## 2020-08-24 DIAGNOSIS — I10 Essential (primary) hypertension: Secondary | ICD-10-CM

## 2020-08-24 MED ORDER — AMLODIPINE BESYLATE 10 MG PO TABS
ORAL_TABLET | Freq: Every day | ORAL | 0 refills | Status: DC
  Filled 2020-08-24: qty 30, 30d supply, fill #0

## 2020-08-24 NOTE — Telephone Encounter (Signed)
Appointment 08/29/20- courtesy #[phone] given

## 2020-08-24 NOTE — Telephone Encounter (Signed)
Medication Refill - Medication:   amLODipine (NORVASC) 10 MG tablet  Has the patient contacted their pharmacy? Yes.  Pt stated the refill was denied, Pt has upcoming appt on 08/29/20.  Preferred Pharmacy (with phone number or street name):   [facility]  [address] [place] [address]  Phone: [phone] Fax: [phone]    Agent: Please be advised that RX refills may take up to 3 business days. We ask that you follow-up with your pharmacy.

## 2020-08-29 ENCOUNTER — Other Ambulatory Visit: Payer: Self-pay

## 2020-08-29 ENCOUNTER — Ambulatory Visit (INDEPENDENT_AMBULATORY_CARE_PROVIDER_SITE_OTHER): Payer: No Typology Code available for payment source | Admitting: Internal Medicine

## 2020-08-29 ENCOUNTER — Encounter: Payer: Self-pay | Admitting: Internal Medicine

## 2020-08-29 VITALS — BP 123/63 | HR 70 | Temp 97.5°F | Resp 17 | Ht 64.0 in | Wt 229.4 lb

## 2020-08-29 DIAGNOSIS — E782 Mixed hyperlipidemia: Secondary | ICD-10-CM

## 2020-08-29 DIAGNOSIS — Z6839 Body mass index (BMI) 39.0-39.9, adult: Secondary | ICD-10-CM

## 2020-08-29 DIAGNOSIS — I10 Essential (primary) hypertension: Secondary | ICD-10-CM | POA: Diagnosis not present

## 2020-08-29 MED ORDER — AMLODIPINE BESYLATE 10 MG PO TABS
ORAL_TABLET | Freq: Every day | ORAL | 1 refills | Status: DC
  Filled 2020-08-29 – 2020-09-13 (×2): qty 90, 90d supply, fill #0
  Filled 2021-01-10: qty 90, 90d supply, fill #1

## 2020-08-29 NOTE — Patient Instructions (Signed)

## 2020-08-29 NOTE — Progress Notes (Signed)
Subjective:    Patient ID: Kelli Chapman, female    DOB: 05/17/1960, 60 y.o.   MRN: [id number]  HPI  Patient presents to clinic today for follow-up of chronic conditions.  She is establishing care with me today, transferring care from Malva Cogan, NP.  HTN: Her BP today is 123/63.  She is taking Amlodipine as prescribed.  ECG from 01/2019 reviewed.  HLD: Her last LDL was 139, triglycerides 85, 05/2019.  She dis not currently taking Atorvastatin.  She does not try to consume a low-fat diet.  Review of Systems      Past Medical History:  Diagnosis Date  . Arthritis    right shoulder  . Hypertension     Current Outpatient Medications  Medication Sig Dispense Refill  . amLODipine (NORVASC) 10 MG tablet TAKE 1 TABLET BY MOUTH DAILY. 30 tablet 0  . atorvastatin (LIPITOR) 20 MG tablet Take 1 tablet (20 mg total) by mouth daily. 90 tablet 3   No current facility-administered medications for this visit.    Allergies  Allergen Reactions  . Other     Family History  Problem Relation Age of Onset  . Hypertension Mother   . Heart disease Mother   . Kidney disease Paternal Grandfather     Social History   Socioeconomic History  . Marital status: Widowed    Spouse name: Not on file  . Number of children: Not on file  . Years of education: Not on file  . Highest education level: Not on file  Occupational History  . Not on file  Tobacco Use  . Smoking status: Never Smoker  . Smokeless tobacco: Never Used  Vaping Use  . Vaping Use: Never used  Substance and Sexual Activity  . Alcohol use: No  . Drug use: No  . Sexual activity: Not on file  Other Topics Concern  . Not on file  Social History Narrative  . Not on file   Social Determinants of Health   Financial Resource Strain: Not on file  Food Insecurity: Not on file  Transportation Needs: Not on file  Physical Activity: Not on file  Stress: Not on file  Social Connections: Not on file  Intimate Partner  Violence: Not on file     Constitutional: Denies fever, malaise, fatigue, headache or abrupt weight changes.  Respiratory: Denies difficulty breathing, shortness of breath, cough or sputum production.   Cardiovascular: Denies chest pain, chest tightness, palpitations or swelling in the hands or feet.  Neurological: Denies dizziness, difficulty with memory, difficulty with speech or problems with balance and coordination.    No other specific complaints in a complete review of systems (except as listed in HPI above).  Objective:   Physical Exam   BP 123/63 (BP Location: Right Arm, Patient Position: Sitting, Cuff Size: Large)   Pulse 70   Temp (!) 97.5 F (36.4 C) (Temporal)   Resp 17   Ht 5\' 4"  (1.626 m)   Wt 229 lb 6.4 oz (104.1 kg)   SpO2 100%   BMI 39.38 kg/m   Wt Readings from Last 3 Encounters:  09/21/19 223 lb 3.2 oz (101.2 kg)  06/15/19 223 lb (101.2 kg)  02/08/19 233 lb (105.7 kg)    General: Appears her stated age, obese, in NAD. HEENT: Head: normal shape and size; Eyes: sclera white and EOMs intact; Cardiovascular: Normal rate and rhythm. S1,S2 noted.  No murmur, rubs or gallops noted. No JVD or BLE edema. No carotid bruits noted. Pulmonary/Chest:  Normal effort and positive vesicular breath sounds. No respiratory distress. No wheezes, rales or ronchi noted.  AMusculoskeletal: No difficulty with gait.  Neurological: Alert and oriented.   BMET    Component Value Date/Time   NA 143 06/15/2019 0840   NA 141 04/13/2014 0654   K 4.3 06/15/2019 0840   K 3.5 04/13/2014 0654   CL 106 06/15/2019 0840   CL 106 04/13/2014 0654   CO2 25 06/15/2019 0840   CO2 29 04/13/2014 0654   GLUCOSE 95 06/15/2019 0840   GLUCOSE 91 04/13/2014 0654   BUN 13 06/15/2019 0840   BUN 12 04/13/2014 0654   CREATININE 0.81 06/15/2019 0840   CALCIUM 9.4 06/15/2019 0840   CALCIUM 8.7 04/13/2014 0654   GFRNONAA 81 06/15/2019 0840   GFRAA 93 06/15/2019 0840    Lipid Panel      Component Value Date/Time   CHOL 206 (H) 06/15/2019 0840   CHOL 178 09/16/2012 0743   TRIG 85 06/15/2019 0840   TRIG 79 09/16/2012 0743   HDL 49 (L) 06/15/2019 0840   HDL 41 09/16/2012 0743   CHOLHDL 4.2 06/15/2019 0840   VLDL 17 09/09/2017 0739   VLDL 16 09/16/2012 0743   LDLCALC 139 (H) 06/15/2019 0840   LDLCALC 121 (H) 09/16/2012 0743    CBC    Component Value Date/Time   WBC 6.3 06/15/2019 0840   RBC 5.16 (H) 06/15/2019 0840   HGB 13.2 06/15/2019 0840   HGB 12.8 05/09/2011 1506   HCT 41.7 06/15/2019 0840   HCT 40.2 05/09/2011 1506   PLT 318 06/15/2019 0840   PLT 289 05/09/2011 1506   MCV 80.8 06/15/2019 0840   MCV 80 05/09/2011 1506   MCH 25.6 (L) 06/15/2019 0840   MCHC 31.7 (L) 06/15/2019 0840   RDW 14.0 06/15/2019 0840   RDW 13.7 05/09/2011 1506   LYMPHSABS 2,703 06/15/2019 0840   LYMPHSABS 2.3 05/09/2011 1506   MONOABS 1.0 (H) 09/09/2017 0739   MONOABS 0.6 05/09/2011 1506   EOSABS 50 06/15/2019 0840   EOSABS 0.2 05/09/2011 1506   BASOSABS 50 06/15/2019 0840   BASOSABS 0.0 05/09/2011 1506    Hgb A1C Lab Results  Component Value Date   HGBA1C 5.7 (H) 06/15/2019           Assessment & Plan:     Nicki Reaper, NP This visit occurred during the SARS-CoV-2 public health emergency.  Safety protocols were in place, including screening questions prior to the visit, additional usage of staff PPE, and extensive cleaning of exam room while observing appropriate contact time as indicated for disinfecting solutions.

## 2020-08-29 NOTE — Assessment & Plan Note (Signed)
Controlled on Amlodipine Reinforced DASH diet and exercise for weight loss CMET today 

## 2020-08-29 NOTE — Assessment & Plan Note (Signed)
CMET and lipid profile today Will let her know if she needs to restart Atorvastatin Encouraged her to consume a low fat diet

## 2020-08-30 LAB — LIPID PANEL
Cholesterol: 187 mg/dL (ref <200)
HDL: 52 mg/dL (ref >=50)
LDL Cholesterol (Calc): 116 mg/dL (calc) — ABNORMAL HIGH
Non-HDL Cholesterol (Calc): 135 mg/dL (calc) — ABNORMAL HIGH (ref <130)
Total CHOL/HDL Ratio: 3.6 (calc) (ref <5.0)
Triglycerides: 93 mg/dL (ref <150)

## 2020-08-30 LAB — COMPLETE METABOLIC PANEL WITH GFR
AG Ratio: 1.5 (calc) (ref 1.0–2.5)
ALT: 13 U/L (ref 6–29)
AST: 17 U/L (ref 10–35)
Albumin: 4.1 g/dL (ref 3.6–5.1)
Alkaline phosphatase (APISO): 101 U/L (ref 37–153)
BUN: 13 mg/dL (ref 7–25)
CO2: 29 mmol/L (ref 20–32)
Calcium: 9.3 mg/dL (ref 8.6–10.4)
Chloride: 106 mmol/L (ref 98–110)
Creat: 0.88 mg/dL (ref 0.50–1.05)
GFR, Est African American: 84 mL/min/{1.73_m2} (ref >=60)
GFR, Est Non African American: 72 mL/min/{1.73_m2} (ref >=60)
Globulin: 2.8 g/dL (calc) (ref 1.9–3.7)
Glucose, Bld: 93 mg/dL (ref 65–99)
Potassium: 3.8 mmol/L (ref 3.5–5.3)
Sodium: 142 mmol/L (ref 135–146)
Total Bilirubin: 0.3 mg/dL (ref 0.2–1.2)
Total Protein: 6.9 g/dL (ref 6.1–8.1)

## 2020-09-13 ENCOUNTER — Other Ambulatory Visit: Payer: Self-pay

## 2020-09-17 ENCOUNTER — Other Ambulatory Visit: Payer: Self-pay
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
## 2020-11-08 ENCOUNTER — Other Ambulatory Visit: Payer: Self-pay | Admitting: Internal Medicine

## 2020-11-14 ENCOUNTER — Ambulatory Visit (INDEPENDENT_AMBULATORY_CARE_PROVIDER_SITE_OTHER): Payer: No Typology Code available for payment source | Admitting: Internal Medicine

## 2020-11-14 ENCOUNTER — Encounter: Payer: Self-pay | Admitting: Internal Medicine

## 2020-11-14 ENCOUNTER — Other Ambulatory Visit: Payer: Self-pay

## 2020-11-14 VITALS — BP 126/64 | HR 73 | Temp 97.1°F | Resp 17 | Ht 64.0 in | Wt 232.0 lb

## 2020-11-14 DIAGNOSIS — R7303 Prediabetes: Secondary | ICD-10-CM | POA: Insufficient documentation

## 2020-11-14 DIAGNOSIS — R2231 Localized swelling, mass and lump, right upper limb: Secondary | ICD-10-CM

## 2020-11-14 NOTE — Patient Instructions (Signed)
Lymphadenopathy °Lymphadenopathy means that your lymph glands are swollen or larger than normal. Lymph glands, also called lymph nodes, are collections of tissue that filter excess fluid, bacteria, viruses, and waste from your bloodstream. They are part of your body's disease-fighting system (immune system), which protects your body from germs. °There may be different causes of lymphadenopathy, depending on where it is in your body. Some types go away on their own. Lymphadenopathy can occur anywhere that you have lymph glands, including these areas: °Neck (cervical lymphadenopathy). °Chest (mediastinal lymphadenopathy). °Lungs (hilar lymphadenopathy). °Underarms (axillary lymphadenopathy). °Groin (inguinal lymphadenopathy). °When your immune system responds to germs, infection-fighting cells and fluid build up in your lymph glands. This causes some swelling and enlargement. If the lymph nodes do not go back to normal size after you have an infection or disease, your health care provider may do tests. These tests help to monitor your condition and find the reason why the glands are still swollen and enlarged. °Follow these instructions at home: ° °Get plenty of rest. °Your health care provider may recommend over-the-counter medicines for pain. Take over-the-counter and prescription medicines only as told by your health care provider. °If directed, apply heat to swollen lymph glands as often as told by your health care provider. Use the heat source that your health care provider recommends, such as a moist heat pack or a heating pad. °Place a towel between your skin and the heat source. °Leave the heat on for 20-30 minutes. °Remove the heat if your skin turns bright red. This is especially important if you are unable to feel pain, heat, or cold. You may have a greater risk of getting burned. °Check your affected lymph glands every day for changes. Check other lymph gland areas as told by your health care provider.  Check for changes such as: °More swelling. °Sudden increase in size. °Redness or pain. °Hardness. °Keep all follow-up visits. This is important. °Contact a health care provider if you have: °Lymph glands that: °Are still swollen after 2 weeks. °Have suddenly gotten bigger or the swelling spreads. °Are red, painful, or hard. °Fluid leaking from the skin near an enlarged lymph gland. °Problems with breathing. °A fever, chills, or night sweats. °Fatigue. °A sore throat. °Pain in your abdomen. °Weight loss. °Get help right away if you have: °Severe pain. °Chest pain. °Shortness of breath. °These symptoms may represent a serious problem that is an emergency. Do not wait to see if the symptoms will go away. Get medical help right away. Call your local emergency services (911 in the [place] Do not drive yourself to the hospital. °Summary °Lymphadenopathy means that your lymph glands are swollen or larger than normal. °Lymph glands, also called lymph nodes, are collections of tissue that filter excess fluid, bacteria, viruses, and waste from the bloodstream. They are part of your body's disease-fighting system (immune system). °Lymphadenopathy can occur anywhere that you have lymph glands. °If the lymph nodes do not go back to normal size after you have an infection or disease, your health care provider may do tests to monitor your condition and find the reason why the glands are still swollen and enlarged. °Check your affected lymph glands every day for changes. Check other lymph gland areas as told by your health care provider. °This information is not intended to replace advice given to you by your health care provider. Make sure you discuss any questions you have with your health care provider. °Document Revised: 01/11/2020 Document Reviewed: 01/11/2020 °Elsevier Patient Education © 2022   [employer] ° °

## 2020-11-14 NOTE — Progress Notes (Signed)
Subjective:    Patient ID: Kelli Chapman, female    DOB: 28-Jun-1960, 60 y.o.   MRN: [id number]  HPI  Patient presents the clinic today with complaint of a lump in her axilla.  She noticed this 2 weeks ago.  The area has not gotten larger in size.  She has not noticed any redness, swelling or drainage from the area.  The area is not tender to touch.  She denies any masses or lumps within her right breast.  Her last mammogram was 07/2019.  She has not recently gotten her COVID-vaccine.  Review of Systems     Past Medical History:  Diagnosis Date   Arthritis    right shoulder   Hypertension     Current Outpatient Medications  Medication Sig Dispense Refill   amLODipine (NORVASC) 10 MG tablet TAKE 1 TABLET BY MOUTH DAILY. 90 tablet 1   atorvastatin (LIPITOR) 20 MG tablet Take 1 tablet (20 mg total) by mouth daily. (Patient not taking: Reported on 08/29/2020) 90 tablet 3   No current facility-administered medications for this visit.    Allergies  Allergen Reactions   Other     Family History  Problem Relation Age of Onset   Hypertension Mother    Heart disease Mother    Kidney disease Paternal Grandfather     Social History   Socioeconomic History   Marital status: Widowed    Spouse name: Not on file   Number of children: Not on file   Years of education: Not on file   Highest education level: Not on file  Occupational History   Not on file  Tobacco Use   Smoking status: Never   Smokeless tobacco: Never  Vaping Use   Vaping Use: Never used  Substance and Sexual Activity   Alcohol use: No   Drug use: No   Sexual activity: Not on file  Other Topics Concern   Not on file  Social History Narrative   Not on file   Social Determinants of Health   Financial Resource Strain: Not on file  Food Insecurity: Not on file  Transportation Needs: Not on file  Physical Activity: Not on file  Stress: Not on file  Social Connections: Not on file  Intimate Partner Violence:  Not on file     Constitutional: Denies fever, malaise, fatigue, headache or abrupt weight changes.  Respiratory: Denies difficulty breathing, shortness of breath, cough or sputum production.   Cardiovascular: Denies chest pain, chest tightness, palpitations or swelling in the hands or feet.  Skin: Patient reports a lump in her right axilla.  Denies redness, rashes,  or ulcercations.   No other specific complaints in a complete review of systems (except as listed in HPI above).  Objective:  BP 126/64 (BP Location: Right Arm, Patient Position: Sitting, Cuff Size: Large)   Pulse 73   Temp (!) 97.1 F (36.2 C) (Temporal)   Resp 17   Ht 5\' 4"  (1.626 m)   Wt 232 lb (105.2 kg)   SpO2 100%   BMI 39.82 kg/m   Wt Readings from Last 3 Encounters:  08/29/20 229 lb 6.4 oz (104.1 kg)  09/21/19 223 lb 3.2 oz (101.2 kg)  06/15/19 223 lb (101.2 kg)    General: Appears her stated age, obese, in NAD. Skin: Warm, dry and intact.  4 cm x 2 and half centimeter oval mass noted from 3-6 o'clock in the right axilla.  Breast symmetrical.  No mass or lump palpated in  the right breast.  No discharge noted from the nipple. Cardiovascular: Normal rate. Pulmonary/Chest: Normal effort. Neurological: Alert and oriented.    BMET    Component Value Date/Time   NA 142 08/29/2020 0930   NA 141 04/13/2014 0654   K 3.8 08/29/2020 0930   K 3.5 04/13/2014 0654   CL 106 08/29/2020 0930   CL 106 04/13/2014 0654   CO2 29 08/29/2020 0930   CO2 29 04/13/2014 0654   GLUCOSE 93 08/29/2020 0930   GLUCOSE 91 04/13/2014 0654   BUN 13 08/29/2020 0930   BUN 12 04/13/2014 0654   CREATININE 0.88 08/29/2020 0930   CALCIUM 9.3 08/29/2020 0930   CALCIUM 8.7 04/13/2014 0654   GFRNONAA 72 08/29/2020 0930   GFRAA 84 08/29/2020 0930    Lipid Panel     Component Value Date/Time   CHOL 187 08/29/2020 0930   CHOL 178 09/16/2012 0743   TRIG 93 08/29/2020 0930   TRIG 79 09/16/2012 0743   HDL 52 08/29/2020 0930   HDL  41 09/16/2012 0743   CHOLHDL 3.6 08/29/2020 0930   VLDL 17 09/09/2017 0739   VLDL 16 09/16/2012 0743   LDLCALC 116 (H) 08/29/2020 0930   LDLCALC 121 (H) 09/16/2012 0743    CBC    Component Value Date/Time   WBC 6.3 06/15/2019 0840   RBC 5.16 (H) 06/15/2019 0840   HGB 13.2 06/15/2019 0840   HGB 12.8 05/09/2011 1506   HCT 41.7 06/15/2019 0840   HCT 40.2 05/09/2011 1506   PLT 318 06/15/2019 0840   PLT 289 05/09/2011 1506   MCV 80.8 06/15/2019 0840   MCV 80 05/09/2011 1506   MCH 25.6 (L) 06/15/2019 0840   MCHC 31.7 (L) 06/15/2019 0840   RDW 14.0 06/15/2019 0840   RDW 13.7 05/09/2011 1506   LYMPHSABS 2,703 06/15/2019 0840   LYMPHSABS 2.3 05/09/2011 1506   MONOABS 1.0 (H) 09/09/2017 0739   MONOABS 0.6 05/09/2011 1506   EOSABS 50 06/15/2019 0840   EOSABS 0.2 05/09/2011 1506   BASOSABS 50 06/15/2019 0840   BASOSABS 0.0 05/09/2011 1506    Hgb A1C Lab Results  Component Value Date   HGBA1C 5.7 (H) 06/15/2019            Assessment & Plan:   Lump of Right Axilla:  Will obtain diagnostic mammogram and ultrasound of the right breast including axilla  Will follow-up after imaging is obtained, return precautions discussed Nicki Reaper, NP This visit occurred during the SARS-CoV-2 public health emergency.  Safety protocols were in place, including screening questions prior to the visit, additional usage of staff PPE, and extensive cleaning of exam room while observing appropriate contact time as indicated for disinfecting solutions.

## 2020-11-14 NOTE — Addendum Note (Signed)
Addended by: Lorre Munroe on: 11/14/2020 10:27 AM   Modules accepted: Orders

## 2020-11-16 ENCOUNTER — Other Ambulatory Visit: Payer: Self-pay

## 2020-11-16 ENCOUNTER — Ambulatory Visit
Admission: RE | Admit: 2020-11-16 | Discharge: 2020-11-16 | Disposition: A | Discharge status: Home / Self Care | Payer: No Typology Code available for payment source | Source: Ambulatory Visit | Attending: Internal Medicine | Admitting: Internal Medicine

## 2020-11-16 DIAGNOSIS — R2231 Localized swelling, mass and lump, right upper limb: Secondary | ICD-10-CM | POA: Insufficient documentation

## 2020-11-19 ENCOUNTER — Other Ambulatory Visit: Payer: Self-pay | Admitting: Internal Medicine

## 2020-11-19 DIAGNOSIS — R2231 Localized swelling, mass and lump, right upper limb: Secondary | ICD-10-CM

## 2020-11-19 DIAGNOSIS — R928 Other abnormal and inconclusive findings on diagnostic imaging of breast: Secondary | ICD-10-CM

## 2020-11-28 ENCOUNTER — Ambulatory Visit: Admission: RE | Admit: 2020-11-28 | Payer: No Typology Code available for payment source | Source: Ambulatory Visit

## 2020-12-07 ENCOUNTER — Ambulatory Visit: Payer: No Typology Code available for payment source

## 2020-12-12 ENCOUNTER — Other Ambulatory Visit: Payer: Self-pay

## 2020-12-12 ENCOUNTER — Ambulatory Visit
Admission: RE | Admit: 2020-12-12 | Discharge: 2020-12-12 | Disposition: A | Discharge status: Home / Self Care | Payer: No Typology Code available for payment source | Source: Ambulatory Visit | Attending: Internal Medicine | Admitting: Internal Medicine

## 2020-12-12 DIAGNOSIS — R928 Other abnormal and inconclusive findings on diagnostic imaging of breast: Secondary | ICD-10-CM | POA: Insufficient documentation

## 2020-12-12 DIAGNOSIS — R2231 Localized swelling, mass and lump, right upper limb: Secondary | ICD-10-CM | POA: Diagnosis present

## 2020-12-12 HISTORY — PX: BREAST BIOPSY: SHX20

## 2020-12-14 ENCOUNTER — Encounter: Payer: Self-pay | Admitting: Diagnostic Radiology

## 2020-12-14 LAB — SURGICAL PATHOLOGY

## 2020-12-17 ENCOUNTER — Telehealth: Payer: Self-pay

## 2020-12-17 NOTE — Telephone Encounter (Signed)
Kelli Chapman  from [facility] called to say right axillary bx benign. Attempted to call PCP office to inform PCP about results. Pt has been unable to reach.

## 2020-12-18 NOTE — Telephone Encounter (Signed)
noted 

## 2021-01-10 ENCOUNTER — Other Ambulatory Visit: Payer: Self-pay

## 2021-03-13 ENCOUNTER — Encounter: Payer: No Typology Code available for payment source | Admitting: Internal Medicine

## 2021-04-17 ENCOUNTER — Other Ambulatory Visit: Payer: Self-pay

## 2021-04-17 ENCOUNTER — Ambulatory Visit (INDEPENDENT_AMBULATORY_CARE_PROVIDER_SITE_OTHER): Payer: No Typology Code available for payment source | Admitting: Internal Medicine

## 2021-04-17 ENCOUNTER — Encounter: Payer: Self-pay | Admitting: Internal Medicine

## 2021-04-17 VITALS — BP 106/63 | HR 72 | Ht 64.0 in | Wt 225.8 lb

## 2021-04-17 DIAGNOSIS — Z0001 Encounter for general adult medical examination with abnormal findings: Secondary | ICD-10-CM | POA: Diagnosis not present

## 2021-04-17 DIAGNOSIS — E6609 Other obesity due to excess calories: Secondary | ICD-10-CM | POA: Insufficient documentation

## 2021-04-17 DIAGNOSIS — Z1211 Encounter for screening for malignant neoplasm of colon: Secondary | ICD-10-CM

## 2021-04-17 DIAGNOSIS — Z6838 Body mass index (BMI) 38.0-38.9, adult: Secondary | ICD-10-CM

## 2021-04-17 MED ORDER — AMLODIPINE BESYLATE 10 MG PO TABS
ORAL_TABLET | Freq: Every day | ORAL | 1 refills | Status: DC
  Filled 2021-04-17 – 2021-05-03 (×2): qty 90, 90d supply, fill #0
  Filled 2021-09-17: qty 90, 90d supply, fill #1

## 2021-04-17 NOTE — Assessment & Plan Note (Signed)
Encourage diet and exercise for weight loss 

## 2021-04-17 NOTE — Patient Instructions (Signed)
Health Maintenance for Postmenopausal Women ?Menopause is a normal process in which your ability to get pregnant comes to an end. This process happens slowly over many months or years, usually between the ages of 48 and 55. Menopause is complete when you have missed your menstrual period for 12 months. ?It is important to talk with your health care provider about some of the most common conditions that affect women after menopause (postmenopausal women). These include heart disease, cancer, and bone loss (osteoporosis). Adopting a healthy lifestyle and getting preventive care can help to promote your health and wellness. The actions you take can also lower your chances of developing some of these common conditions. ?What are the signs and symptoms of menopause? ?During menopause, you may have the following symptoms: ?Hot flashes. These can be moderate or severe. ?Night sweats. ?Decrease in sex drive. ?Mood swings. ?Headaches. ?Tiredness (fatigue). ?Irritability. ?Memory problems. ?Problems falling asleep or staying asleep. ?Talk with your health care provider about treatment options for your symptoms. ?Do I need hormone replacement therapy? ?Hormone replacement therapy is effective in treating symptoms that are caused by menopause, such as hot flashes and night sweats. ?Hormone replacement carries certain risks, especially as you become older. If you are thinking about using estrogen or estrogen with progestin, discuss the benefits and risks with your health care provider. ?How can I reduce my risk for heart disease and stroke? ?The risk of heart disease, heart attack, and stroke increases as you age. One of the causes may be a change in the body's hormones during menopause. This can affect how your body uses dietary fats, triglycerides, and cholesterol. Heart attack and stroke are medical emergencies. There are many things that you can do to help prevent heart disease and stroke. ?Watch your blood pressure ?High  blood pressure causes heart disease and increases the risk of stroke. This is more likely to develop in people who have high blood pressure readings or are overweight. ?Have your blood pressure checked: ?Every 3-5 years if you are 18-39 years of age. ?Every year if you are 40 years old or older. ?Eat a healthy diet ? ?Eat a diet that includes plenty of vegetables, fruits, low-fat dairy products, and lean protein. ?Do not eat a lot of foods that are high in solid fats, added sugars, or sodium. ?Get regular exercise ?Get regular exercise. This is one of the most important things you can do for your health. Most adults should: ?Try to exercise for at least 150 minutes each week. The exercise should increase your heart rate and make you sweat (moderate-intensity exercise). ?Try to do strengthening exercises at least twice each week. Do these in addition to the moderate-intensity exercise. ?Spend less time sitting. Even light physical activity can be beneficial. ?Other tips ?Work with your health care provider to achieve or maintain a healthy weight. ?Do not use any products that contain nicotine or tobacco. These products include cigarettes, chewing tobacco, and vaping devices, such as e-cigarettes. If you need help quitting, ask your health care provider. ?Know your numbers. Ask your health care provider to check your cholesterol and your blood sugar (glucose). Continue to have your blood tested as directed by your health care provider. ?Do I need screening for cancer? ?Depending on your health history and family history, you may need to have cancer screenings at different stages of your life. This may include screening for: ?Breast cancer. ?Cervical cancer. ?Lung cancer. ?Colorectal cancer. ?What is my risk for osteoporosis? ?After menopause, you may be   at increased risk for osteoporosis. Osteoporosis is a condition in which bone destruction happens more quickly than new bone creation. To help prevent osteoporosis or  the bone fractures that can happen because of osteoporosis, you may take the following actions: ?If you are 19-50 years old, get at least 1,000 mg of calcium and at least 600 international units (IU) of vitamin D per day. ?If you are older than age 50 but younger than age 70, get at least 1,200 mg of calcium and at least 600 international units (IU) of vitamin D per day. ?If you are older than age 70, get at least 1,200 mg of calcium and at least 800 international units (IU) of vitamin D per day. ?Smoking and drinking excessive alcohol increase the risk of osteoporosis. Eat foods that are rich in calcium and vitamin D, and do weight-bearing exercises several times each week as directed by your health care provider. ?How does menopause affect my mental health? ?Depression may occur at any age, but it is more common as you become older. Common symptoms of depression include: ?Feeling depressed. ?Changes in sleep patterns. ?Changes in appetite or eating patterns. ?Feeling an overall lack of motivation or enjoyment of activities that you previously enjoyed. ?Frequent crying spells. ?Talk with your health care provider if you think that you are experiencing any of these symptoms. ?General instructions ?See your health care provider for regular wellness exams and vaccines. This may include: ?Scheduling regular health, dental, and eye exams. ?Getting and maintaining your vaccines. These include: ?Influenza vaccine. Get this vaccine each year before the flu season begins. ?Pneumonia vaccine. ?Shingles vaccine. ?Tetanus, diphtheria, and pertussis (Tdap) booster vaccine. ?Your health care provider may also recommend other immunizations. ?Tell your health care provider if you have ever been abused or do not feel safe at home. ?Summary ?Menopause is a normal process in which your ability to get pregnant comes to an end. ?This condition causes hot flashes, night sweats, decreased interest in sex, mood swings, headaches, or lack  of sleep. ?Treatment for this condition may include hormone replacement therapy. ?Take actions to keep yourself healthy, including exercising regularly, eating a healthy diet, watching your weight, and checking your blood pressure and blood sugar levels. ?Get screened for cancer and depression. Make sure that you are up to date with all your vaccines. ?This information is not intended to replace advice given to you by your health care provider. Make sure you discuss any questions you have with your health care provider. ?Document Revised: 08/06/2020 Document Reviewed: 08/06/2020 ?Elsevier Patient Education ? 2022 [employer] ? ?

## 2021-04-17 NOTE — Progress Notes (Signed)
Subjective:    Patient ID: Kelli Chapman, female    DOB: 1961/01/05, 61 y.o.   MRN: [id number]  HPI  Patient presents the clinic today for her annual exam.  Flu: 11/2020 Tetanus: 08/2017 COVID: Pfizer x 2 Shingrix: never Pap smear: 08/2017 Mammogram: 11/2020 Bone density: 09/2017 Colon screening: 09/2017 Vision screening: as needed Dentist: as needed  Diet: She does eat meat. She consumes fruits and veggies. She does eat some fried foods. She drinks mostly water, soda and Gatorade. Exercise: Walking  Review of Systems     Past Medical History:  Diagnosis Date   Arthritis    right shoulder   Hypertension     Current Outpatient Medications  Medication Sig Dispense Refill   amLODipine (NORVASC) 10 MG tablet TAKE 1 TABLET BY MOUTH DAILY. 90 tablet 1   atorvastatin (LIPITOR) 20 MG tablet Take 1 tablet (20 mg total) by mouth daily. (Patient not taking: Reported on 11/14/2020) 90 tablet 3   No current facility-administered medications for this visit.    Allergies  Allergen Reactions   Other     Family History  Problem Relation Age of Onset   Hypertension Mother    Heart disease Mother    Kidney disease Paternal Grandfather     Social History   Socioeconomic History   Marital status: Widowed    Spouse name: Not on file   Number of children: Not on file   Years of education: Not on file   Highest education level: Not on file  Occupational History   Not on file  Tobacco Use   Smoking status: Never   Smokeless tobacco: Never  Vaping Use   Vaping Use: Never used  Substance and Sexual Activity   Alcohol use: No   Drug use: No   Sexual activity: Not on file  Other Topics Concern   Not on file  Social History Narrative   Not on file   Social Determinants of Health   Financial Resource Strain: Not on file  Food Insecurity: Not on file  Transportation Needs: Not on file  Physical Activity: Not on file  Stress: Not on file  Social Connections: Not on file   Intimate Partner Violence: Not on file     Constitutional: Denies fever, malaise, fatigue, headache or abrupt weight changes.  HEENT: Denies eye pain, eye redness, ear pain, ringing in the ears, wax buildup, runny nose, nasal congestion, bloody nose, or sore throat. Respiratory: Denies difficulty breathing, shortness of breath, cough or sputum production.   Cardiovascular: Denies chest pain, chest tightness, palpitations or swelling in the hands or feet.  Gastrointestinal: Denies abdominal pain, bloating, constipation, diarrhea or blood in the stool.  GU: Denies urgency, frequency, pain with urination, burning sensation, blood in urine, odor or discharge. Musculoskeletal: Denies decrease in range of motion, difficulty with gait, muscle pain or joint pain and swelling.  Skin: Denies redness, rashes, lesions or ulcercations.  Neurological: Denies dizziness, difficulty with memory, difficulty with speech or problems with balance and coordination.  Psych: Denies anxiety, depression, SI/HI.  No other specific complaints in a complete review of systems (except as listed in HPI above).  Objective:   Physical Exam   BP 106/63 (BP Location: Right Arm, Patient Position: Sitting, Cuff Size: Large)    Pulse 72    Ht _0  (1.626 m)    Wt 225 lb 12.8 oz (102.4 kg)    SpO2 99%    BMI 38.76 kg/m   Wt Readings from  Last 3 Encounters:  11/14/20 232 lb (105.2 kg)  08/29/20 229 lb 6.4 oz (104.1 kg)  09/21/19 223 lb 3.2 oz (101.2 kg)    General: Appears her stated age, obese, in NAD. Skin: Warm, dry and intact.  HEENT: Head: normal shape and size; Eyes: sclera white and EOMs intact;  Neck:  Neck supple, trachea midline. No masses, lumps or thyromegaly present.  Cardiovascular: Normal rate and rhythm. S1,S2 noted.  No murmur, rubs or gallops noted. No JVD or BLE edema. No carotid bruits noted. Pulmonary/Chest: Normal effort and positive vesicular breath sounds. No respiratory distress. No wheezes,  rales or ronchi noted.  Abdomen: Soft and nontender. Normal bowel sounds.  Musculoskeletal: Strength 5/5 BUE/BLE.  No difficulty with gait.  Neurological: Alert and oriented. Cranial nerves II-XII grossly intact. Coordination normal.  Psychiatric: Mood and affect normal. Behavior is normal. Judgment and thought content normal.    BMET    Component Value Date/Time   NA 142 08/29/2020 0930   NA 141 04/13/2014 0654   K 3.8 08/29/2020 0930   K 3.5 04/13/2014 0654   CL 106 08/29/2020 0930   CL 106 04/13/2014 0654   CO2 29 08/29/2020 0930   CO2 29 04/13/2014 0654   GLUCOSE 93 08/29/2020 0930   GLUCOSE 91 04/13/2014 0654   BUN 13 08/29/2020 0930   BUN 12 04/13/2014 0654   CREATININE 0.88 08/29/2020 0930   CALCIUM 9.3 08/29/2020 0930   CALCIUM 8.7 04/13/2014 0654   GFRNONAA 72 08/29/2020 0930   GFRAA 84 08/29/2020 0930    Lipid Panel     Component Value Date/Time   CHOL 187 08/29/2020 0930   CHOL 178 09/16/2012 0743   TRIG 93 08/29/2020 0930   TRIG 79 09/16/2012 0743   HDL 52 08/29/2020 0930   HDL 41 09/16/2012 0743   CHOLHDL 3.6 08/29/2020 0930   VLDL 17 09/09/2017 0739   VLDL 16 09/16/2012 0743   LDLCALC 116 (H) 08/29/2020 0930   LDLCALC 121 (H) 09/16/2012 0743    CBC    Component Value Date/Time   WBC 6.3 06/15/2019 0840   RBC 5.16 (H) 06/15/2019 0840   HGB 13.2 06/15/2019 0840   HGB 12.8 05/09/2011 1506   HCT 41.7 06/15/2019 0840   HCT 40.2 05/09/2011 1506   PLT 318 06/15/2019 0840   PLT 289 05/09/2011 1506   MCV 80.8 06/15/2019 0840   MCV 80 05/09/2011 1506   MCH 25.6 (L) 06/15/2019 0840   MCHC 31.7 (L) 06/15/2019 0840   RDW 14.0 06/15/2019 0840   RDW 13.7 05/09/2011 1506   LYMPHSABS 2,703 06/15/2019 0840   LYMPHSABS 2.3 05/09/2011 1506   MONOABS 1.0 (H) 09/09/2017 0739   MONOABS 0.6 05/09/2011 1506   EOSABS 50 06/15/2019 0840   EOSABS 0.2 05/09/2011 1506   BASOSABS 50 06/15/2019 0840   BASOSABS 0.0 05/09/2011 1506    Hgb A1C Lab Results   Component Value Date   HGBA1C 5.7 (H) 06/15/2019          Assessment & Plan:   Preventative Health Maintenance:  Flu shot UTD Tetanus UTD Encouraged her to get her COVID-booster Cologuard ordered Discussed Shingrix vaccine, she will check coverage with her insurance company Pap smear UTD Mammogram UTD Bone density UTD Referral to GI for screening colonoscopy Encouraged her to consume a balanced diet and exercise regimen Advised her to see an eye doctor and dentist annually We will check CBC, c-Met, lipid, A1c today  RTC in 6 months, follow-up chronic  conditions Webb Silversmith, NP This visit occurred during the SARS-CoV-2 public health emergency.  Safety protocols were in place, including screening questions prior to the visit, additional usage of staff PPE, and extensive cleaning of exam room while observing appropriate contact time as indicated for disinfecting solutions.

## 2021-04-18 LAB — LIPID PANEL
Cholesterol: 201 mg/dL — ABNORMAL HIGH (ref <200)
HDL: 56 mg/dL (ref >=50)
LDL Cholesterol (Calc): 129 mg/dL (calc) — ABNORMAL HIGH
Non-HDL Cholesterol (Calc): 145 mg/dL (calc) — ABNORMAL HIGH (ref <130)
Total CHOL/HDL Ratio: 3.6 (calc) (ref <5.0)
Triglycerides: 64 mg/dL (ref <150)

## 2021-04-18 LAB — HEMOGLOBIN A1C
Hgb A1c MFr Bld: 5.6 % of total Hgb (ref <5.7)
Mean Plasma Glucose: 114 mg/dL
eAG (mmol/L): 6.3 mmol/L

## 2021-04-18 LAB — COMPLETE METABOLIC PANEL WITH GFR
AG Ratio: 1.4 (calc) (ref 1.0–2.5)
ALT: 12 U/L (ref 6–29)
AST: 14 U/L (ref 10–35)
Albumin: 4.1 g/dL (ref 3.6–5.1)
Alkaline phosphatase (APISO): 90 U/L (ref 37–153)
BUN: 12 mg/dL (ref 7–25)
CO2: 30 mmol/L (ref 20–32)
Calcium: 9.4 mg/dL (ref 8.6–10.4)
Chloride: 106 mmol/L (ref 98–110)
Creat: 0.77 mg/dL (ref 0.50–1.03)
Globulin: 2.9 g/dL (calc) (ref 1.9–3.7)
Glucose, Bld: 97 mg/dL (ref 65–99)
Potassium: 4.2 mmol/L (ref 3.5–5.3)
Sodium: 142 mmol/L (ref 135–146)
Total Bilirubin: 0.3 mg/dL (ref 0.2–1.2)
Total Protein: 7 g/dL (ref 6.1–8.1)
eGFR: 89 mL/min/{1.73_m2} (ref >=60)

## 2021-04-18 LAB — CBC
HCT: 42.7 % (ref 35.0–45.0)
Hemoglobin: 13.3 g/dL (ref 11.7–15.5)
MCH: 25.9 pg — ABNORMAL LOW (ref 27.0–33.0)
MCHC: 31.1 g/dL — ABNORMAL LOW (ref 32.0–36.0)
MCV: 83.1 fL (ref 80.0–100.0)
MPV: 10.1 fL (ref 7.5–12.5)
Platelets: 338 10*3/uL (ref 140–400)
RBC: 5.14 10*6/uL — ABNORMAL HIGH (ref 3.80–5.10)
RDW: 13.7 % (ref 11.0–15.0)
WBC: 7 10*3/uL (ref 3.8–10.8)

## 2021-05-02 ENCOUNTER — Other Ambulatory Visit: Payer: Self-pay

## 2021-05-03 ENCOUNTER — Other Ambulatory Visit: Payer: Self-pay

## 2021-05-13 LAB — COLOGUARD: COLOGUARD: NEGATIVE

## 2021-07-31 ENCOUNTER — Ambulatory Visit (INDEPENDENT_AMBULATORY_CARE_PROVIDER_SITE_OTHER): Payer: No Typology Code available for payment source | Admitting: Internal Medicine

## 2021-07-31 ENCOUNTER — Encounter: Payer: Self-pay | Admitting: Internal Medicine

## 2021-07-31 ENCOUNTER — Ambulatory Visit
Admission: RE | Admit: 2021-07-31 | Discharge: 2021-07-31 | Disposition: A | Discharge status: Home / Self Care | Payer: No Typology Code available for payment source | Source: Ambulatory Visit | Attending: Internal Medicine | Admitting: Internal Medicine

## 2021-07-31 ENCOUNTER — Ambulatory Visit
Admission: RE | Admit: 2021-07-31 | Discharge: 2021-07-31 | Disposition: A | Discharge status: Home / Self Care | Payer: No Typology Code available for payment source | Attending: Internal Medicine | Admitting: Internal Medicine

## 2021-07-31 VITALS — BP 116/76 | HR 74 | Temp 96.8°F | Wt 231.0 lb

## 2021-07-31 DIAGNOSIS — M25512 Pain in left shoulder: Secondary | ICD-10-CM | POA: Insufficient documentation

## 2021-07-31 NOTE — Progress Notes (Signed)
? ?Subjective:  ? ? Patient ID: Kelli Chapman, female    DOB: 1960-07-14, 61 y.o.   MRN: [id number] ? ?HPI ? ?Patient presents to clinic today with complaint of left shoulder pain.  This started a few weeks.  She describes the pain as achy. The pain is worse at night. She report she hears a "clicking" when she moves her arm. She denies numbness, tingling or weakness. She denies any injury to the area. She has not tried anything OTC for this.  She has had a rotator cuff repair on the right. ? ?Review of Systems ? ?Past Medical History:  ?Diagnosis Date  ? Arthritis   ? right shoulder  ? Hyperlipidemia   ? Hypertension   ? ? ?Current Outpatient Medications  ?Medication Sig Dispense Refill  ? amLODipine (NORVASC) 10 MG tablet TAKE 1 TABLET BY MOUTH DAILY. 90 tablet 1  ? atorvastatin (LIPITOR) 20 MG tablet Take 1 tablet (20 mg total) by mouth daily. (Patient not taking: Reported on 11/14/2020) 90 tablet 3  ? ?No current facility-administered medications for this visit.  ? ? ?No Active Allergies ? ?Family History  ?Problem Relation Age of Onset  ? Hypertension Mother   ? Heart disease Mother   ? Kidney disease Paternal Grandfather   ? ? ?Social History  ? ?Socioeconomic History  ? Marital status: Widowed  ?  Spouse name: Not on file  ? Number of children: Not on file  ? Years of education: Not on file  ? Highest education level: Not on file  ?Occupational History  ? Not on file  ?Tobacco Use  ? Smoking status: Never  ? Smokeless tobacco: Never  ?Vaping Use  ? Vaping Use: Never used  ?Substance and Sexual Activity  ? Alcohol use: Not Currently  ? Drug use: Never  ? Sexual activity: Yes  ?  Partners: Male  ?Other Topics Concern  ? Not on file  ?Social History Narrative  ? Not on file  ? ?Social Determinants of Health  ? ?Financial Resource Strain: Not on file  ?Food Insecurity: Not on file  ?Transportation Needs: Not on file  ?Physical Activity: Not on file  ?Stress: Not on file  ?Social Connections: Not on file  ?Intimate  Partner Violence: Not on file  ? ? ? ?Constitutional: Denies fever, malaise, fatigue, headache or abrupt weight changes.  ?Respiratory: Denies difficulty breathing, shortness of breath, cough or sputum production.   ?Cardiovascular: Denies chest pain, chest tightness, palpitations or swelling in the hands or feet.  ?Musculoskeletal: Patient reports left shoulder pain.  Denies decrease in range of motion, difficulty with gait, muscle pain or joint swelling.  ?Neurological: Denies numbness, tingling, weakness of the left upper extremity. ? ? ?No other specific complaints in a complete review of systems (except as listed in HPI above). ? ?   ?Objective:  ? Physical Exam ? ?BP 116/76 (BP Location: Left Arm, Patient Position: Sitting, Cuff Size: Large)   Pulse 74   Temp (!) 96.8 ?F (36 ?C) (Temporal)   Wt 231 lb (104.8 kg)   SpO2 99%   BMI 39.65 kg/m?  ? ?Wt Readings from Last 3 Encounters:  ?04/17/21 225 lb 12.8 oz (102.4 kg)  ?11/14/20 232 lb (105.2 kg)  ?08/29/20 229 lb 6.4 oz (104.1 kg)  ? ? ?General: Appears her stated age, obese, in NAD. ?Cardiovascular: Normal rate and rhythm. S1,S2 noted.  No murmur, rubs or gallops noted.  Radial pulse 2+ on the left. ?Pulmonary/Chest: Normal effort and  positive vesicular breath sounds. No respiratory distress. No wheezes, rales or ronchi noted.  ?Musculoskeletal: Normal internal and external rotation of the left shoulder.  No pain with palpation of the left shoulder.  She has mild weakness but not an overtly positive drop can test on the left.  Strength 5/5 BUE.  Handgrips equal. ?Neurological: Alert and oriented.  ? ? ?BMET ?   ?Component Value Date/Time  ? NA 142 04/17/2021 0822  ? NA 141 04/13/2014 0654  ? K 4.2 04/17/2021 0822  ? K 3.5 04/13/2014 0654  ? CL 106 04/17/2021 0822  ? CL 106 04/13/2014 0654  ? CO2 30 04/17/2021 0822  ? CO2 29 04/13/2014 0654  ? GLUCOSE 97 04/17/2021 0822  ? GLUCOSE 91 04/13/2014 0654  ? BUN 12 04/17/2021 0822  ? BUN 12 04/13/2014 0654  ?  CREATININE 0.77 04/17/2021 0822  ? CALCIUM 9.4 04/17/2021 0822  ? CALCIUM 8.7 04/13/2014 0654  ? GFRNONAA 72 08/29/2020 0930  ? GFRAA 84 08/29/2020 0930  ? ? ?Lipid Panel  ?   ?Component Value Date/Time  ? CHOL 201 (H) 04/17/2021 5038  ? CHOL 178 09/16/2012 0743  ? TRIG 64 04/17/2021 0822  ? TRIG 79 09/16/2012 0743  ? HDL 56 04/17/2021 0822  ? HDL 41 09/16/2012 0743  ? CHOLHDL 3.6 04/17/2021 0822  ? VLDL 17 09/09/2017 0739  ? VLDL 16 09/16/2012 0743  ? LDLCALC 129 (H) 04/17/2021 8828  ? LDLCALC 121 (H) 09/16/2012 0743  ? ? ?CBC ?   ?Component Value Date/Time  ? WBC 7.0 04/17/2021 0822  ? RBC 5.14 (H) 04/17/2021 0034  ? HGB 13.3 04/17/2021 0822  ? HGB 12.8 05/09/2011 1506  ? HCT 42.7 04/17/2021 0822  ? HCT 40.2 05/09/2011 1506  ? PLT 338 04/17/2021 0822  ? PLT 289 05/09/2011 1506  ? MCV 83.1 04/17/2021 0822  ? MCV 80 05/09/2011 1506  ? MCH 25.9 (L) 04/17/2021 9179  ? MCHC 31.1 (L) 04/17/2021 1505  ? RDW 13.7 04/17/2021 0822  ? RDW 13.7 05/09/2011 1506  ? LYMPHSABS 2,703 06/15/2019 0840  ? LYMPHSABS 2.3 05/09/2011 1506  ? MONOABS 1.0 (H) 09/09/2017 0739  ? MONOABS 0.6 05/09/2011 1506  ? EOSABS 50 06/15/2019 0840  ? EOSABS 0.2 05/09/2011 1506  ? BASOSABS 50 06/15/2019 0840  ? BASOSABS 0.0 05/09/2011 1506  ? ? ?Hgb A1C ?Lab Results  ?Component Value Date  ? HGBA1C 5.6 04/17/2021  ? ? ? ? ? ? ? ? ?   ?Assessment & Plan:  ?Left Shoulder Pain: ? ?X-ray left shoulder today ?Discussed possibility of physical therapy versus referral to orthopedics versus MRI ?Shoulder exercises given ? ?We will follow-up after imaging with further recommendation and treatment plan ? ?RTC in 2 months for follow up chronic conditions ?Nicki Reaper, NP ? ?

## 2021-07-31 NOTE — Patient Instructions (Signed)
Shoulder Exercises ?Ask your health care provider which exercises are safe for you. Do exercises exactly as told by your health care provider and adjust them as directed. It is normal to feel mild stretching, pulling, tightness, or discomfort as you do these exercises. Stop right away if you feel sudden pain or your pain gets worse. Do not begin these exercises until told by your health care provider. ?Stretching exercises ?External rotation and abduction ?This exercise is sometimes called corner stretch. This exercise rotates your arm outward (external rotation) and moves your arm out from your body (abduction). ?Stand in a doorway with one of your feet slightly in front of the other. This is called a staggered stance. If you cannot reach your forearms to the door frame, stand facing a corner of a room. ?Choose one of the following positions as told by your health care provider: ?Place your hands and forearms on the door frame above your head. ?Place your hands and forearms on the door frame at the height of your head. ?Place your hands on the door frame at the height of your elbows. ?Slowly move your weight onto your front foot until you feel a stretch across your chest and in the front of your shoulders. Keep your head and chest upright and keep your abdominal muscles tight. ?Hold for __________ seconds. ?To release the stretch, shift your weight to your back foot. ?Repeat __________ times. Complete this exercise __________ times a day. ?Extension, standing ?Stand and hold a broomstick, a cane, or a similar object behind your back. ?Your hands should be a little wider than shoulder width apart. ?Your palms should face away from your back. ?Keeping your elbows straight and your shoulder muscles relaxed, move the stick away from your body until you feel a stretch in your shoulders (extension). ?Avoid shrugging your shoulders while you move the stick. Keep your shoulder blades tucked down toward the middle of your  back. ?Hold for __________ seconds. ?Slowly return to the starting position. ?Repeat __________ times. Complete this exercise __________ times a day. ?Range-of-motion exercises ?Pendulum ? ?Stand near a wall or a surface that you can hold onto for balance. ?Bend at the waist and let your left / right arm hang straight down. Use your other arm to support you. Keep your back straight and do not lock your knees. ?Relax your left / right arm and shoulder muscles, and move your hips and your trunk so your left / right arm swings freely. Your arm should swing because of the motion of your body, not because you are using your arm or shoulder muscles. ?Keep moving your hips and trunk so your arm swings in the following directions, as told by your health care provider: ?Side to side. ?Forward and backward. ?In clockwise and counterclockwise circles. ?Continue each motion for __________ seconds, or for as long as told by your health care provider. ?Slowly return to the starting position. ?Repeat __________ times. Complete this exercise __________ times a day. ?Shoulder flexion, standing ? ?Stand and hold a broomstick, a cane, or a similar object. Place your hands a little more than shoulder width apart on the object. Your left / right hand should be palm up, and your other hand should be palm down. ?Keep your elbow straight and your shoulder muscles relaxed. Push the stick up with your healthy arm to raise your left / right arm in front of your body, and then over your head until you feel a stretch in your shoulder (flexion). ?Avoid   shrugging your shoulder while you raise your arm. Keep your shoulder blade tucked down toward the middle of your back. ?Hold for __________ seconds. ?Slowly return to the starting position. ?Repeat __________ times. Complete this exercise __________ times a day. ?Shoulder abduction, standing ?Stand and hold a broomstick, a cane, or a similar object. Place your hands a little more than shoulder  width apart on the object. Your left / right hand should be palm up, and your other hand should be palm down. ?Keep your elbow straight and your shoulder muscles relaxed. Push the object across your body toward your left / right side. Raise your left / right arm to the side of your body (abduction) until you feel a stretch in your shoulder. ?Do not raise your arm above shoulder height unless your health care provider tells you to do that. ?If directed, raise your arm over your head. ?Avoid shrugging your shoulder while you raise your arm. Keep your shoulder blade tucked down toward the middle of your back. ?Hold for __________ seconds. ?Slowly return to the starting position. ?Repeat __________ times. Complete this exercise __________ times a day. ?Internal rotation ? ?Place your left / right hand behind your back, palm up. ?Use your other hand to dangle an exercise band, a towel, or a similar object over your shoulder. Grasp the band with your left / right hand so you are holding on to both ends. ?Gently pull up on the band until you feel a stretch in the front of your left / right shoulder. The movement of your arm toward the center of your body is called internal rotation. ?Avoid shrugging your shoulder while you raise your arm. Keep your shoulder blade tucked down toward the middle of your back. ?Hold for __________ seconds. ?Release the stretch by letting go of the band and lowering your hands. ?Repeat __________ times. Complete this exercise __________ times a day. ?Strengthening exercises ?External rotation ? ?Sit in a stable chair without armrests. ?Secure an exercise band to a stable object at elbow height on your left / right side. ?Place a soft object, such as a folded towel or a small pillow, between your left / right upper arm and your body to move your elbow about 4 inches (10 cm) away from your side. ?Hold the end of the exercise band so it is tight and there is no slack. ?Keeping your elbow pressed  against the soft object, slowly move your forearm out, away from your abdomen (external rotation). Keep your body steady so only your forearm moves. ?Hold for __________ seconds. ?Slowly return to the starting position. ?Repeat __________ times. Complete this exercise __________ times a day. ?Shoulder abduction ? ?Sit in a stable chair without armrests, or stand up. ?Hold a __________ weight in your left / right hand, or hold an exercise band with both hands. ?Start with your arms straight down and your left / right palm facing in, toward your body. ?Slowly lift your left / right hand out to your side (abduction). Do not lift your hand above shoulder height unless your health care provider tells you that this is safe. ?Keep your arms straight. ?Avoid shrugging your shoulder while you do this movement. Keep your shoulder blade tucked down toward the middle of your back. ?Hold for __________ seconds. ?Slowly lower your arm, and return to the starting position. ?Repeat __________ times. Complete this exercise __________ times a day. ?Shoulder extension ?Sit in a stable chair without armrests, or stand up. ?Secure an exercise band   to a stable object in front of you so it is at shoulder height. ?Hold one end of the exercise band in each hand. Your palms should face each other. ?Straighten your elbows and lift your hands up to shoulder height. ?Step back, away from the secured end of the exercise band, until the band is tight and there is no slack. ?Squeeze your shoulder blades together as you pull your hands down to the sides of your thighs (extension). Stop when your hands are straight down by your sides. Do not let your hands go behind your body. ?Hold for __________ seconds. ?Slowly return to the starting position. ?Repeat __________ times. Complete this exercise __________ times a day. ?Shoulder row ?Sit in a stable chair without armrests, or stand up. ?Secure an exercise band to a stable object in front of you so it  is at waist height. ?Hold one end of the exercise band in each hand. Position your palms so that your thumbs are facing the ceiling (neutral position). ?Bend each of your elbows to a 90-degree angle (righ

## 2021-08-01 ENCOUNTER — Ambulatory Visit: Payer: Self-pay | Admitting: *Deleted

## 2021-08-01 NOTE — Telephone Encounter (Signed)
?  Chief Complaint: medication request- shoulder pain ?Symptoms: severe pain at night ?Frequency: 1 month ?Pertinent Negatives: Patient denies neck pain, swelling, rash, fever, numbness, weakness ?Disposition: [] ED /[] Urgent Care (no appt availability in office) / [] Appointment(In office/virtual)/ []  [facility]/ [] Home Care/ [] Refused Recommended Disposition /[] [facility]/ [x]  Follow-up with PCP ?Additional Notes: Patient is awaiting for next steps- she is requesting pain medication for her pain at night- she states it is severe ? ? Answer Assessment - Initial Assessment Questions ?1. ONSET: "When did the pain start?" ?    1 month ago- worse within past 2 weeks ?2. LOCATION: "Where is the pain located?" ?    Left shoulder ?3. PAIN: "How bad is the pain?" (Scale 1-10; or mild, moderate, severe) ?  - MILD (1-3): doesn't interfere with normal activities ?  - MODERATE (4-7): interferes with normal activities (e.g., work or school) or awakens from sleep ?  - SEVERE (8-10): excruciating pain, unable to do any normal activities, unable to move arm at all due to pain ?    Severe at night ?4. 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 CAUSE: "What do you think is causing the shoulder pain?" ?    X ray yesterday- awaiting results ?6. OTHER SYMPTOMS: "Do you have any other symptoms?" (e.g., neck pain, swelling, rash, fever, numbness, weakness) ?    no ?7. PREGNANCY: "Is there any chance you are pregnant?" "When was your last menstrual period?" ? ?Protocols used: Shoulder Pain-A-AH ? ?

## 2021-08-01 NOTE — Telephone Encounter (Signed)
I would be willing to send in a course of steroids. Was she called with result note? She has mild to moderate arthritis in the left shoulder. We could either refer to PT or orthopedics for further evaluation. Let me know which she prefers and if she is okay with Prednisone. ?

## 2021-08-01 NOTE — Telephone Encounter (Signed)
Second attempt to reach patient- call disconnected- picked up and hung up ?

## 2021-08-01 NOTE — Telephone Encounter (Signed)
Summary: Left shoulder pain at night  ? Left shoulder pain at night pt is requesting medication. Pt  stated she saw PCP yesterday and had an x-ray however, really needs medication for pain especially at night.  ? ?Pt seeking clinical advice.   ?  ?Attempted to call patient- left message to call office ?

## 2021-08-02 ENCOUNTER — Other Ambulatory Visit: Payer: Self-pay

## 2021-08-02 ENCOUNTER — Encounter: Payer: Self-pay | Admitting: Internal Medicine

## 2021-08-02 MED ORDER — PREDNISONE 10 MG PO TABS
ORAL_TABLET | ORAL | 0 refills | Status: DC
  Filled 2021-08-02: qty 21, 6d supply, fill #0

## 2021-08-02 NOTE — Telephone Encounter (Signed)
Prednisone sent to pharmacy

## 2021-08-02 NOTE — Telephone Encounter (Signed)
Pt advised.  She would like to try prednisone before physical therapy or ortho referral.  Please send to [facility].  ? ?Thanks,  ? ? ?-Vernona Rieger  ?

## 2021-08-02 NOTE — Addendum Note (Signed)
Addended by: Lorre Munroe on: 08/02/2021 11:21 AM ? ? Modules accepted: Orders ? ?

## 2021-08-02 NOTE — Telephone Encounter (Signed)
See phone message.   Thanks,   -Brad Mcgaughy  

## 2021-09-17 ENCOUNTER — Other Ambulatory Visit: Payer: Self-pay

## 2021-10-02 ENCOUNTER — Ambulatory Visit: Payer: No Typology Code available for payment source | Admitting: Internal Medicine

## 2021-10-10 ENCOUNTER — Other Ambulatory Visit: Payer: Self-pay | Admitting: Internal Medicine

## 2021-10-10 DIAGNOSIS — Z1231 Encounter for screening mammogram for malignant neoplasm of breast: Secondary | ICD-10-CM

## 2021-10-16 ENCOUNTER — Ambulatory Visit (INDEPENDENT_AMBULATORY_CARE_PROVIDER_SITE_OTHER): Payer: No Typology Code available for payment source | Admitting: Internal Medicine

## 2021-10-16 ENCOUNTER — Encounter: Payer: Self-pay | Admitting: Internal Medicine

## 2021-10-16 VITALS — BP 136/74 | HR 70 | Temp 96.9°F | Wt 236.0 lb

## 2021-10-16 DIAGNOSIS — R7303 Prediabetes: Secondary | ICD-10-CM | POA: Diagnosis not present

## 2021-10-16 DIAGNOSIS — I10 Essential (primary) hypertension: Secondary | ICD-10-CM | POA: Diagnosis not present

## 2021-10-16 DIAGNOSIS — E782 Mixed hyperlipidemia: Secondary | ICD-10-CM | POA: Diagnosis not present

## 2021-10-16 NOTE — Assessment & Plan Note (Signed)
C-Met and lipid profile today Encouraged her to consume low-fat diet We will consider restarting atorvastatin pending labs

## 2021-10-16 NOTE — Progress Notes (Signed)
Subjective:    Patient ID: Kelli Chapman, female    DOB: 1960-12-22, 61 y.o.   MRN: [id number]  HPI  Patient presents to clinic today for follow-up of chronic conditions.  HTN: Her BP today is 136/74  She is taking Amlodipine as prescribed.  ECG from 01/2019 reviewed.  HLD: Her last LDL was 129, triglycerides 64, 03/2021.  She is not taking Atorvastatin.  She does not consume a low-fat diet.  Prediabetes: Her last A1c was 5.6%, 03/2021.  She is not taking any oral diabetic medication at this time.  She does not check sugars.  Chronic Left Shoulder Pain: She reports she has a possible torn rotator cuff and osteoarthritis. She is not currently following with orthopedics.  Review of Systems     Past Medical History:  Diagnosis Date   Arthritis    right shoulder   Hyperlipidemia    Hypertension     Current Outpatient Medications  Medication Sig Dispense Refill   amLODipine (NORVASC) 10 MG tablet TAKE 1 TABLET BY MOUTH DAILY. 90 tablet 1   atorvastatin (LIPITOR) 20 MG tablet Take 1 tablet (20 mg total) by mouth daily. (Patient not taking: Reported on 11/14/2020) 90 tablet 3   predniSONE (DELTASONE) 10 MG tablet Take 6 tabs on day 1, 5 tabs on day 2, 4 tabs on day 3, 3 tabs on day 4, 2 tabs on day 5, 1 tab on day 6 21 tablet 0   No current facility-administered medications for this visit.    No Known Allergies  Family History  Problem Relation Age of Onset   Hypertension Mother    Heart disease Mother    Kidney disease Paternal Grandfather     Social History   Socioeconomic History   Marital status: Widowed    Spouse name: Not on file   Number of children: Not on file   Years of education: Not on file   Highest education level: Not on file  Occupational History   Not on file  Tobacco Use   Smoking status: Never   Smokeless tobacco: Never  Vaping Use   Vaping Use: Never used  Substance and Sexual Activity   Alcohol use: Not Currently   Drug use: Never   Sexual  activity: Yes    Partners: Male  Other Topics Concern   Not on file  Social History Narrative   Not on file   Social Determinants of Health   Financial Resource Strain: Not on file  Food Insecurity: Not on file  Transportation Needs: Not on file  Physical Activity: Not on file  Stress: Not on file  Social Connections: Not on file  Intimate Partner Violence: Not on file     Constitutional: Denies fever, malaise, fatigue, headache or abrupt weight changes.  HEENT: Denies eye pain, eye redness, ear pain, ringing in the ears, wax buildup, runny nose, nasal congestion, bloody nose, or sore throat. Respiratory: Denies difficulty breathing, shortness of breath, cough or sputum production.   Cardiovascular: Denies chest pain, chest tightness, palpitations or swelling in the hands or feet.  Gastrointestinal: Denies abdominal pain, bloating, constipation, diarrhea or blood in the stool.  GU: Denies urgency, frequency, pain with urination, burning sensation, blood in urine, odor or discharge. Musculoskeletal: Pt reports chronic left shoulder pain. Denies decrease in range of motion, difficulty with gait, muscle pain or joint swelling.  Skin: Denies redness, rashes, lesions or ulcercations.  Neurological: Denies dizziness, difficulty with memory, difficulty with speech or problems with balance  and coordination.  Psych: Denies anxiety, depression, SI/HI.  No other specific complaints in a complete review of systems (except as listed in HPI above).  Objective:   Physical Exam BP 136/74 (BP Location: Left Arm, Patient Position: Sitting, Cuff Size: Large)   Pulse 70   Temp (!) 96.9 F (36.1 C) (Temporal)   Wt 236 lb (107 kg)   SpO2 100%   BMI 40.51 kg/m   Wt Readings from Last 3 Encounters:  07/31/21 231 lb (104.8 kg)  04/17/21 225 lb 12.8 oz (102.4 kg)  11/14/20 232 lb (105.2 kg)    General: Appears her stated age, obese, in NAD. Skin: Warm, dry and intact.  HEENT: Head: normal  shape and size; Eyes: sclera white, no icterus, conjunctiva pink, PERRLA and EOMs intact;  Cardiovascular: Normal rate and rhythm. S1,S2 noted.  No murmur, rubs or gallops noted. No JVD or BLE edema. No carotid bruits noted. Pulmonary/Chest: Normal effort and positive vesicular breath sounds. No respiratory distress. No wheezes, rales or ronchi noted.  Musculoskeletal: Decreased external rotation of the left shoulder.  Normal internal rotation of left shoulder.  Positive drop can test on the left.  No difficulty with gait.  Neurological: Alert and oriented.    BMET    Component Value Date/Time   NA 142 04/17/2021 0822   NA 141 04/13/2014 0654   K 4.2 04/17/2021 0822   K 3.5 04/13/2014 0654   CL 106 04/17/2021 0822   CL 106 04/13/2014 0654   CO2 30 04/17/2021 0822   CO2 29 04/13/2014 0654   GLUCOSE 97 04/17/2021 0822   GLUCOSE 91 04/13/2014 0654   BUN 12 04/17/2021 0822   BUN 12 04/13/2014 0654   CREATININE 0.77 04/17/2021 0822   CALCIUM 9.4 04/17/2021 0822   CALCIUM 8.7 04/13/2014 0654   GFRNONAA 72 08/29/2020 0930   GFRAA 84 08/29/2020 0930    Lipid Panel     Component Value Date/Time   CHOL 201 (H) 04/17/2021 0822   CHOL 178 09/16/2012 0743   TRIG 64 04/17/2021 0822   TRIG 79 09/16/2012 0743   HDL 56 04/17/2021 0822   HDL 41 09/16/2012 0743   CHOLHDL 3.6 04/17/2021 0822   VLDL 17 09/09/2017 0739   VLDL 16 09/16/2012 0743   LDLCALC 129 (H) 04/17/2021 0822   LDLCALC 121 (H) 09/16/2012 0743    CBC    Component Value Date/Time   WBC 7.0 04/17/2021 0822   RBC 5.14 (H) 04/17/2021 0822   HGB 13.3 04/17/2021 0822   HGB 12.8 05/09/2011 1506   HCT 42.7 04/17/2021 0822   HCT 40.2 05/09/2011 1506   PLT 338 04/17/2021 0822   PLT 289 05/09/2011 1506   MCV 83.1 04/17/2021 0822   MCV 80 05/09/2011 1506   MCH 25.9 (L) 04/17/2021 0822   MCHC 31.1 (L) 04/17/2021 0822   RDW 13.7 04/17/2021 0822   RDW 13.7 05/09/2011 1506   LYMPHSABS 2,703 06/15/2019 0840   LYMPHSABS 2.3  05/09/2011 1506   MONOABS 1.0 (H) 09/09/2017 0739   MONOABS 0.6 05/09/2011 1506   EOSABS 50 06/15/2019 0840   EOSABS 0.2 05/09/2011 1506   BASOSABS 50 06/15/2019 0840   BASOSABS 0.0 05/09/2011 1506    Hgb A1C Lab Results  Component Value Date   HGBA1C 5.6 04/17/2021           Assessment & Plan:    RTC in 6 months for annual exam Nicki Reaper, NP

## 2021-10-16 NOTE — Patient Instructions (Signed)
Heart-Healthy Eating Plan Heart-healthy meal planning includes: Eating less unhealthy fats. Eating more healthy fats. Making other changes in your diet. Talk with your doctor or a diet specialist (dietitian) to create an eating plan that is right for you. What is my plan? Your doctor may recommend an eating plan that includes: Total fat: ______% or less of total calories a day. Saturated fat: ______% or less of total calories a day. Cholesterol: less than _________mg a day. What are tips for following this plan? Cooking Avoid frying your food. Try to bake, boil, grill, or broil it instead. You can also reduce fat by: Removing the skin from poultry. Removing all visible fats from meats. Steaming vegetables in water or broth. Meal planning  At meals, divide your plate into four equal parts: Fill one-half of your plate with vegetables and green salads. Fill one-fourth of your plate with whole grains. Fill one-fourth of your plate with lean protein foods. Eat 4-5 servings of vegetables per day. A serving of vegetables is: 1 cup of raw or cooked vegetables. 2 cups of raw leafy greens. Eat 4-5 servings of fruit per day. A serving of fruit is: 1 medium whole fruit.  cup of dried fruit.  cup of fresh, frozen, or canned fruit.  cup of 100% fruit juice. Eat more foods that have soluble fiber. These are apples, broccoli, carrots, beans, peas, and barley. Try to get 20-30 g of fiber per day. Eat 4-5 servings of nuts, legumes, and seeds per week: 1 serving of dried beans or legumes equals  cup after being cooked. 1 serving of nuts is  cup. 1 serving of seeds equals 1 tablespoon. General information Eat more home-cooked food. Eat less restaurant, buffet, and fast food. Limit or avoid alcohol. Limit foods that are high in starch and sugar. Avoid fried foods. Lose weight if you are overweight. Keep track of how much salt (sodium) you eat. This is important if you have high blood  pressure. Ask your doctor to tell you more about this. Try to add vegetarian meals each week. Fats Choose healthy fats. These include olive oil and canola oil, flaxseeds, walnuts, almonds, and seeds. Eat more omega-3 fats. These include salmon, mackerel, sardines, tuna, flaxseed oil, and ground flaxseeds. Try to eat fish at least 2 times each week. Check food labels. Avoid foods with trans fats or high amounts of saturated fat. Limit saturated fats. These are often found in animal products, such as meats, butter, and cream. These are also found in plant foods, such as palm oil, palm kernel oil, and coconut oil. Avoid foods with partially hydrogenated oils in them. These have trans fats. Examples are stick margarine, some tub margarines, cookies, crackers, and other baked goods. What foods can I eat? Fruits All fresh, canned (in natural juice), or frozen fruits. Vegetables Fresh or frozen vegetables (raw, steamed, roasted, or grilled). Green salads. Grains Most grains. Choose whole wheat and whole grains most of the time. Rice and pasta, including brown rice and pastas made with whole wheat. Meats and other proteins Lean, well-trimmed beef, veal, pork, and lamb. Chicken and turkey without skin. All fish and shellfish. Wild duck, rabbit, pheasant, and venison. Egg whites or low-cholesterol egg substitutes. Dried beans, peas, lentils, and tofu. Seeds and most nuts. Dairy Low-fat or nonfat cheeses, including ricotta and mozzarella. Skim or 1% milk that is liquid, powdered, or evaporated. Buttermilk that is made with low-fat milk. Nonfat or low-fat yogurt. Fats and oils Non-hydrogenated (trans-free) margarines. Vegetable oils, including   soybean, sesame, sunflower, olive, peanut, safflower, corn, canola, and cottonseed. Salad dressings or mayonnaise made with a vegetable oil. Beverages Mineral water. Coffee and tea. Diet carbonated beverages. Sweets and desserts Sherbet, gelatin, and fruit ice.  Small amounts of dark chocolate. Limit all sweets and desserts. Seasonings and condiments All seasonings and condiments. The items listed above may not be a complete list of foods and drinks you can eat. Contact a dietitian for more options. What foods should I avoid? Fruits Canned fruit in heavy syrup. Fruit in cream or butter sauce. Fried fruit. Limit coconut. Vegetables Vegetables cooked in cheese, cream, or butter sauce. Fried vegetables. Grains Breads that are made with saturated or trans fats, oils, or whole milk. Croissants. Sweet rolls. Donuts. High-fat crackers, such as cheese crackers. Meats and other proteins Fatty meats, such as hot dogs, ribs, sausage, bacon, rib-eye roast or steak. High-fat deli meats, such as salami and bologna. Caviar. Domestic duck and goose. Organ meats, such as liver. Dairy Cream, sour cream, cream cheese, and creamed cottage cheese. Whole-milk cheeses. Whole or 2% milk that is liquid, evaporated, or condensed. Whole buttermilk. Cream sauce or high-fat cheese sauce. Yogurt that is made from whole milk. Fats and oils Meat fat, or shortening. Cocoa butter, hydrogenated oils, palm oil, coconut oil, palm kernel oil. Solid fats and shortenings, including bacon fat, salt pork, lard, and butter. Nondairy cream substitutes. Salad dressings with cheese or sour cream. Beverages Regular sodas and juice drinks with added sugar. Sweets and desserts Frosting. Pudding. Cookies. Cakes. Pies. Milk chocolate or white chocolate. Buttered syrups. Full-fat ice cream or ice cream drinks. The items listed above may not be a complete list of foods and drinks to avoid. Contact a dietitian for more information. Summary Heart-healthy meal planning includes eating less unhealthy fats, eating more healthy fats, and making other changes in your diet. Eat a balanced diet. This includes fruits and vegetables, low-fat or nonfat dairy, lean protein, nuts and legumes, whole grains, and  heart-healthy oils and fats. This information is not intended to replace advice given to you by your health care provider. Make sure you discuss any questions you have with your health care provider. Document Revised: 07/26/2020 Document Reviewed: 07/26/2020 Elsevier Patient Education  2022 [employer]  

## 2021-10-16 NOTE — Assessment & Plan Note (Signed)
Controlled on amlodipine Reinforced DASH diet and exercise for weight loss C-Met today

## 2021-10-16 NOTE — Assessment & Plan Note (Signed)
A1c today Encourage low-carb diet and exercise for weight loss 

## 2021-10-16 NOTE — Assessment & Plan Note (Signed)
Encourage diet and exercise for weight loss 

## 2021-10-17 LAB — COMPLETE METABOLIC PANEL WITH GFR
AG Ratio: 1.4 (calc) (ref 1.0–2.5)
ALT: 13 U/L (ref 6–29)
AST: 16 U/L (ref 10–35)
Albumin: 3.9 g/dL (ref 3.6–5.1)
Alkaline phosphatase (APISO): 89 U/L (ref 37–153)
BUN: 12 mg/dL (ref 7–25)
CO2: 27 mmol/L (ref 20–32)
Calcium: 9.4 mg/dL (ref 8.6–10.4)
Chloride: 106 mmol/L (ref 98–110)
Creat: 0.84 mg/dL (ref 0.50–1.03)
Globulin: 2.8 g/dL (calc) (ref 1.9–3.7)
Glucose, Bld: 64 mg/dL — ABNORMAL LOW (ref 65–139)
Potassium: 3.9 mmol/L (ref 3.5–5.3)
Sodium: 142 mmol/L (ref 135–146)
Total Bilirubin: 0.3 mg/dL (ref 0.2–1.2)
Total Protein: 6.7 g/dL (ref 6.1–8.1)
eGFR: 80 mL/min/{1.73_m2} (ref >=60)

## 2021-10-17 LAB — LIPID PANEL
Cholesterol: 198 mg/dL (ref <200)
HDL: 55 mg/dL (ref >=50)
LDL Cholesterol (Calc): 126 mg/dL (calc) — ABNORMAL HIGH
Non-HDL Cholesterol (Calc): 143 mg/dL (calc) — ABNORMAL HIGH (ref <130)
Total CHOL/HDL Ratio: 3.6 (calc) (ref <5.0)
Triglycerides: 74 mg/dL (ref <150)

## 2021-10-17 LAB — HEMOGLOBIN A1C
Hgb A1c MFr Bld: 5.7 % of total Hgb — ABNORMAL HIGH (ref <5.7)
Mean Plasma Glucose: 117 mg/dL
eAG (mmol/L): 6.5 mmol/L

## 2021-11-20 ENCOUNTER — Ambulatory Visit
Admission: RE | Admit: 2021-11-20 | Discharge: 2021-11-20 | Disposition: A | Discharge status: Home / Self Care | Payer: No Typology Code available for payment source | Source: Ambulatory Visit | Attending: Internal Medicine | Admitting: Internal Medicine

## 2021-11-20 DIAGNOSIS — Z1231 Encounter for screening mammogram for malignant neoplasm of breast: Secondary | ICD-10-CM | POA: Diagnosis present

## 2022-04-09 ENCOUNTER — Ambulatory Visit: Payer: Self-pay | Admitting: Internal Medicine

## 2022-04-09 NOTE — Progress Notes (Deleted)
Subjective:    Patient ID: Kelli Chapman, female    DOB: 03-03-1961, 62 y.o.   MRN: [id number]  HPI    Review of Systems  Past Medical History:  Diagnosis Date   Arthritis    right shoulder   Hyperlipidemia    Hypertension     Current Outpatient Medications  Medication Sig Dispense Refill   amLODipine (NORVASC) 10 MG tablet TAKE 1 TABLET BY MOUTH DAILY. 90 tablet 1   atorvastatin (LIPITOR) 20 MG tablet Take 1 tablet (20 mg total) by mouth daily. (Patient not taking: Reported on 11/14/2020) 90 tablet 3   No current facility-administered medications for this visit.    No Known Allergies  Family History  Problem Relation Age of Onset   Hypertension Mother    Heart disease Mother    Kidney disease Paternal Grandfather     Social History   Socioeconomic History   Marital status: Widowed    Spouse name: Not on file   Number of children: Not on file   Years of education: Not on file   Highest education level: Not on file  Occupational History   Not on file  Tobacco Use   Smoking status: Never   Smokeless tobacco: Never  Vaping Use   Vaping Use: Never used  Substance and Sexual Activity   Alcohol use: Not Currently   Drug use: Never   Sexual activity: Yes    Partners: Male  Other Topics Concern   Not on file  Social History Narrative   Not on file   Social Determinants of Health   Financial Resource Strain: Not on file  Food Insecurity: Not on file  Transportation Needs: Not on file  Physical Activity: Not on file  Stress: Not on file  Social Connections: Not on file  Intimate Partner Violence: Not on file     Constitutional: Denies fever, malaise, fatigue, headache or abrupt weight changes.  HEENT: Denies eye pain, eye redness, ear pain, ringing in the ears, wax buildup, runny nose, nasal congestion, bloody nose, or sore throat. Respiratory: Denies difficulty breathing, shortness of breath, cough or sputum production.   Cardiovascular: Denies chest  pain, chest tightness, palpitations or swelling in the hands or feet.  Gastrointestinal: Denies abdominal pain, bloating, constipation, diarrhea or blood in the stool.  GU: Denies urgency, frequency, pain with urination, burning sensation, blood in urine, odor or discharge. Musculoskeletal: Denies decrease in range of motion, difficulty with gait, muscle pain or joint pain and swelling.  Skin: Denies redness, rashes, lesions or ulcercations.  Neurological: Denies dizziness, difficulty with memory, difficulty with speech or problems with balance and coordination.  Psych: Denies anxiety, depression, SI/HI.  No other specific complaints in a complete review of systems (except as listed in HPI above).     Objective:   Physical Exam   There were no vitals taken for this visit. Wt Readings from Last 3 Encounters:  10/16/21 236 lb (107 kg)  07/31/21 231 lb (104.8 kg)  04/17/21 225 lb 12.8 oz (102.4 kg)    General: Appears their stated age, well developed, well nourished in NAD. Skin: Warm, dry and intact. No rashes, lesions or ulcerations noted. HEENT: Head: normal shape and size; Eyes: sclera white, no icterus, conjunctiva pink, PERRLA and EOMs intact; Ears: Tm's gray and intact, normal light reflex; Nose: mucosa pink and moist, septum midline; Throat/Mouth: Teeth present, mucosa pink and moist, no exudate, lesions or ulcerations noted.  Neck:  Neck supple, trachea midline. No masses,  lumps or thyromegaly present.  Cardiovascular: Normal rate and rhythm. S1,S2 noted.  No murmur, rubs or gallops noted. No JVD or BLE edema. No carotid bruits noted. Pulmonary/Chest: Normal effort and positive vesicular breath sounds. No respiratory distress. No wheezes, rales or ronchi noted.  Abdomen: Soft and nontender. Normal bowel sounds. No distention or masses noted. Liver, spleen and kidneys non palpable. Musculoskeletal: Normal range of motion. No signs of joint swelling. No difficulty with gait.   Neurological: Alert and oriented. Cranial nerves II-XII grossly intact. Coordination normal.  Psychiatric: Mood and affect normal. Behavior is normal. Judgment and thought content normal.   BMET    Component Value Date/Time   NA 142 10/16/2021 1053   NA 141 04/13/2014 0654   K 3.9 10/16/2021 1053   K 3.5 04/13/2014 0654   CL 106 10/16/2021 1053   CL 106 04/13/2014 0654   CO2 27 10/16/2021 1053   CO2 29 04/13/2014 0654   GLUCOSE 64 (L) 10/16/2021 1053   GLUCOSE 91 04/13/2014 0654   BUN 12 10/16/2021 1053   BUN 12 04/13/2014 0654   CREATININE 0.84 10/16/2021 1053   CALCIUM 9.4 10/16/2021 1053   CALCIUM 8.7 04/13/2014 0654   GFRNONAA 72 08/29/2020 0930   GFRAA 84 08/29/2020 0930    Lipid Panel     Component Value Date/Time   CHOL 198 10/16/2021 1053   CHOL 178 09/16/2012 0743   TRIG 74 10/16/2021 1053   TRIG 79 09/16/2012 0743   HDL 55 10/16/2021 1053   HDL 41 09/16/2012 0743   CHOLHDL 3.6 10/16/2021 1053   VLDL 17 09/09/2017 0739   VLDL 16 09/16/2012 0743   LDLCALC 126 (H) 10/16/2021 1053   LDLCALC 121 (H) 09/16/2012 0743    CBC    Component Value Date/Time   WBC 7.0 04/17/2021 0822   RBC 5.14 (H) 04/17/2021 0822   HGB 13.3 04/17/2021 0822   HGB 12.8 05/09/2011 1506   HCT 42.7 04/17/2021 0822   HCT 40.2 05/09/2011 1506   PLT 338 04/17/2021 0822   PLT 289 05/09/2011 1506   MCV 83.1 04/17/2021 0822   MCV 80 05/09/2011 1506   MCH 25.9 (L) 04/17/2021 0822   MCHC 31.1 (L) 04/17/2021 0822   RDW 13.7 04/17/2021 0822   RDW 13.7 05/09/2011 1506   LYMPHSABS 2,703 06/15/2019 0840   LYMPHSABS 2.3 05/09/2011 1506   MONOABS 1.0 (H) 09/09/2017 0739   MONOABS 0.6 05/09/2011 1506   EOSABS 50 06/15/2019 0840   EOSABS 0.2 05/09/2011 1506   BASOSABS 50 06/15/2019 0840   BASOSABS 0.0 05/09/2011 1506    Hgb A1C Lab Results  Component Value Date   HGBA1C 5.7 (H) 10/16/2021           Assessment & Plan:    RTC in 6 months, follow-up chronic conditions Webb Silversmith, NP

## 2022-04-23 ENCOUNTER — Other Ambulatory Visit: Payer: Self-pay

## 2022-04-23 ENCOUNTER — Encounter: Payer: Self-pay | Admitting: Internal Medicine

## 2022-04-23 ENCOUNTER — Ambulatory Visit (INDEPENDENT_AMBULATORY_CARE_PROVIDER_SITE_OTHER): Payer: 59 | Admitting: Internal Medicine

## 2022-04-23 VITALS — BP 158/96 | HR 69 | Temp 97.1°F | Ht 64.0 in | Wt 230.0 lb

## 2022-04-23 DIAGNOSIS — Z1231 Encounter for screening mammogram for malignant neoplasm of breast: Secondary | ICD-10-CM | POA: Diagnosis not present

## 2022-04-23 DIAGNOSIS — I10 Essential (primary) hypertension: Secondary | ICD-10-CM | POA: Diagnosis not present

## 2022-04-23 DIAGNOSIS — E6609 Other obesity due to excess calories: Secondary | ICD-10-CM | POA: Insufficient documentation

## 2022-04-23 DIAGNOSIS — R7303 Prediabetes: Secondary | ICD-10-CM | POA: Diagnosis not present

## 2022-04-23 DIAGNOSIS — Z0001 Encounter for general adult medical examination with abnormal findings: Secondary | ICD-10-CM | POA: Diagnosis not present

## 2022-04-23 DIAGNOSIS — Z780 Asymptomatic menopausal state: Secondary | ICD-10-CM | POA: Diagnosis not present

## 2022-04-23 DIAGNOSIS — Z6839 Body mass index (BMI) 39.0-39.9, adult: Secondary | ICD-10-CM

## 2022-04-23 DIAGNOSIS — E66813 Obesity, class 3: Secondary | ICD-10-CM | POA: Insufficient documentation

## 2022-04-23 DIAGNOSIS — E782 Mixed hyperlipidemia: Secondary | ICD-10-CM | POA: Diagnosis not present

## 2022-04-23 MED ORDER — AMLODIPINE BESYLATE 5 MG PO TABS
5.0000 mg | ORAL_TABLET | Freq: Every day | ORAL | 1 refills | Status: DC
  Filled 2022-04-23: qty 90, 90d supply, fill #0

## 2022-04-23 NOTE — Patient Instructions (Signed)
Health Maintenance for Postmenopausal Women Menopause is a normal process in which your ability to get pregnant comes to an end. This process happens slowly over many months or years, usually between the ages of 48 and 55. Menopause is complete when you have missed your menstrual period for 12 months. It is important to talk with your health care provider about some of the most common conditions that affect women after menopause (postmenopausal women). These include heart disease, cancer, and bone loss (osteoporosis). Adopting a healthy lifestyle and getting preventive care can help to promote your health and wellness. The actions you take can also lower your chances of developing some of these common conditions. What are the signs and symptoms of menopause? During menopause, you may have the following symptoms: Hot flashes. These can be moderate or severe. Night sweats. Decrease in sex drive. Mood swings. Headaches. Tiredness (fatigue). Irritability. Memory problems. Problems falling asleep or staying asleep. Talk with your health care provider about treatment options for your symptoms. Do I need hormone replacement therapy? Hormone replacement therapy is effective in treating symptoms that are caused by menopause, such as hot flashes and night sweats. Hormone replacement carries certain risks, especially as you become older. If you are thinking about using estrogen or estrogen with progestin, discuss the benefits and risks with your health care provider. How can I reduce my risk for heart disease and stroke? The risk of heart disease, heart attack, and stroke increases as you age. One of the causes may be a change in the body's hormones during menopause. This can affect how your body uses dietary fats, triglycerides, and cholesterol. Heart attack and stroke are medical emergencies. There are many things that you can do to help prevent heart disease and stroke. Watch your blood pressure High  blood pressure causes heart disease and increases the risk of stroke. This is more likely to develop in people who have high blood pressure readings or are overweight. Have your blood pressure checked: Every 3-5 years if you are 18-39 years of age. Every year if you are 40 years old or older. Eat a healthy diet  Eat a diet that includes plenty of vegetables, fruits, low-fat dairy products, and lean protein. Do not eat a lot of foods that are high in solid fats, added sugars, or sodium. Get regular exercise Get regular exercise. This is one of the most important things you can do for your health. Most adults should: Try to exercise for at least 150 minutes each week. The exercise should increase your heart rate and make you sweat (moderate-intensity exercise). Try to do strengthening exercises at least twice each week. Do these in addition to the moderate-intensity exercise. Spend less time sitting. Even light physical activity can be beneficial. Other tips Work with your health care provider to achieve or maintain a healthy weight. Do not use any products that contain nicotine or tobacco. These products include cigarettes, chewing tobacco, and vaping devices, such as e-cigarettes. If you need help quitting, ask your health care provider. Know your numbers. Ask your health care provider to check your cholesterol and your blood sugar (glucose). Continue to have your blood tested as directed by your health care provider. Do I need screening for cancer? Depending on your health history and family history, you may need to have cancer screenings at different stages of your life. This may include screening for: Breast cancer. Cervical cancer. Lung cancer. Colorectal cancer. What is my risk for osteoporosis? After menopause, you may be   at increased risk for osteoporosis. Osteoporosis is a condition in which bone destruction happens more quickly than new bone creation. To help prevent osteoporosis or  the bone fractures that can happen because of osteoporosis, you may take the following actions: If you are 19-50 years old, get at least 1,000 mg of calcium and at least 600 international units (IU) of vitamin D per day. If you are older than age 50 but younger than age 70, get at least 1,200 mg of calcium and at least 600 international units (IU) of vitamin D per day. If you are older than age 70, get at least 1,200 mg of calcium and at least 800 international units (IU) of vitamin D per day. Smoking and drinking excessive alcohol increase the risk of osteoporosis. Eat foods that are rich in calcium and vitamin D, and do weight-bearing exercises several times each week as directed by your health care provider. How does menopause affect my mental health? Depression may occur at any age, but it is more common as you become older. Common symptoms of depression include: Feeling depressed. Changes in sleep patterns. Changes in appetite or eating patterns. Feeling an overall lack of motivation or enjoyment of activities that you previously enjoyed. Frequent crying spells. Talk with your health care provider if you think that you are experiencing any of these symptoms. General instructions See your health care provider for regular wellness exams and vaccines. This may include: Scheduling regular health, dental, and eye exams. Getting and maintaining your vaccines. These include: Influenza vaccine. Get this vaccine each year before the flu season begins. Pneumonia vaccine. Shingles vaccine. Tetanus, diphtheria, and pertussis (Tdap) booster vaccine. Your health care provider may also recommend other immunizations. Tell your health care provider if you have ever been abused or do not feel safe at home. Summary Menopause is a normal process in which your ability to get pregnant comes to an end. This condition causes hot flashes, night sweats, decreased interest in sex, mood swings, headaches, or lack  of sleep. Treatment for this condition may include hormone replacement therapy. Take actions to keep yourself healthy, including exercising regularly, eating a healthy diet, watching your weight, and checking your blood pressure and blood sugar levels. Get screened for cancer and depression. Make sure that you are up to date with all your vaccines. This information is not intended to replace advice given to you by your health care provider. Make sure you discuss any questions you have with your health care provider. Document Revised: 08/06/2020 Document Reviewed: 08/06/2020 Elsevier Patient Education  2023 [employer]  

## 2022-04-23 NOTE — Assessment & Plan Note (Signed)
Uncontrolled off meds Amlodipine refilled today Reinforced DASH diet and exercise for weight loss

## 2022-04-23 NOTE — Progress Notes (Signed)
Subjective:    Patient ID: Kelli Chapman, female    DOB: [birth year], 62 y.o.   MRN: [id number]  HPI  Patient presents to clinic today for her annual exam.  Of note, her BP today is 164/98.  She admits that she ran out of her Amlodipine 2 days ago.  Flu: 11/2021 Tetanus: 08/2017 COVID: Pfizer x 2 Shingrix: Never Pap smear: 08/2017 Mammogram: 10/2021 Bone density: 09/2017 Colon screening: 05/2021, Cologuard Vision screening: as needed Dentist: as needed, dentures  Diet: She does eat meat. She consumes fruits and veggies. She does eat fried foods. She drinks mostly soda, water, energy drinks. Exercise: Walking  Review of Systems     Past Medical History:  Diagnosis Date   Arthritis    right shoulder   Hyperlipidemia    Hypertension     Current Outpatient Medications  Medication Sig Dispense Refill   amLODipine (NORVASC) 10 MG tablet TAKE 1 TABLET BY MOUTH DAILY. 90 tablet 1   atorvastatin (LIPITOR) 20 MG tablet Take 1 tablet (20 mg total) by mouth daily. (Patient not taking: Reported on 11/14/2020) 90 tablet 3   No current facility-administered medications for this visit.    No Known Allergies  Family History  Problem Relation Age of Onset   Hypertension Mother    Heart disease Mother    Kidney disease Paternal Grandfather     Social History   Socioeconomic History   Marital status: Widowed    Spouse name: Not on file   Number of children: Not on file   Years of education: Not on file   Highest education level: Not on file  Occupational History   Not on file  Tobacco Use   Smoking status: Never   Smokeless tobacco: Never  Vaping Use   Vaping Use: Never used  Substance and Sexual Activity   Alcohol use: Not Currently   Drug use: Never   Sexual activity: Yes    Partners: Male  Other Topics Concern   Not on file  Social History Narrative   Not on file   Social Determinants of Health   Financial Resource Strain: Not on file  Food Insecurity: Not on file   Transportation Needs: Not on file  Physical Activity: Not on file  Stress: Not on file  Social Connections: Not on file  Intimate Partner Violence: Not on file     Constitutional: Denies fever, malaise, fatigue, headache or abrupt weight changes.  HEENT: Denies eye pain, eye redness, ear pain, ringing in the ears, wax buildup, runny nose, nasal congestion, bloody nose, or sore throat. Respiratory: Denies difficulty breathing, shortness of breath, cough or sputum production.   Cardiovascular: Denies chest pain, chest tightness, palpitations or swelling in the hands or feet.  Gastrointestinal: Denies abdominal pain, bloating, constipation, diarrhea or blood in the stool.  GU: Denies urgency, frequency, pain with urination, burning sensation, blood in urine, odor or discharge. Musculoskeletal: Denies decrease in range of motion, difficulty with gait, muscle pain or joint pain and swelling.  Skin: Denies redness, rashes, lesions or ulcercations.  Neurological: Denies dizziness, difficulty with memory, difficulty with speech or problems with balance and coordination.  Psych: Denies anxiety, depression, SI/HI.  No other specific complaints in a complete review of systems (except as listed in HPI above).  Objective:   Physical Exam   BP (!) 158/96 (BP Location: Left Arm, Patient Position: Sitting, Cuff Size: Large)   Pulse 69   Temp (!) 97.1 F (36.2 C) (Temporal)  Ht 5\' 4"  (1.626 m)   Wt 230 lb (104.3 kg)   SpO2 98%   BMI 39.48 kg/m   Wt Readings from Last 3 Encounters:  10/16/21 236 lb (107 kg)  07/31/21 231 lb (104.8 kg)  04/17/21 225 lb 12.8 oz (102.4 kg)    General: Appears her stated age, obese in NAD. Skin: Warm, dry and intact. No rashes, lesions or ulcerations noted. HEENT: Head: normal shape and size; Eyes: sclera white, no icterus, conjunctiva pink, PERRLA and EOMs intact;  Neck:  Neck supple, trachea midline. No masses, lumps or thyromegaly present.   Cardiovascular: Normal rate and rhythm. S1,S2 noted.  No murmur, rubs or gallops noted. No JVD or BLE edema. No carotid bruits noted. Pulmonary/Chest: Normal effort and positive vesicular breath sounds. No respiratory distress. No wheezes, rales or ronchi noted.  Abdomen: Normal bowel sounds.  Musculoskeletal: Strength 5/5 BUE/BLE.  No difficulty with gait.  Neurological: Alert and oriented. Cranial nerves II-XII grossly intact. Coordination normal.  Psychiatric: Mood and affect normal. Behavior is normal. Judgment and thought content normal.     BMET    Component Value Date/Time   NA 142 10/16/2021 1053   NA 141 04/13/2014 0654   K 3.9 10/16/2021 1053   K 3.5 04/13/2014 0654   CL 106 10/16/2021 1053   CL 106 04/13/2014 0654   CO2 27 10/16/2021 1053   CO2 29 04/13/2014 0654   GLUCOSE 64 (L) 10/16/2021 1053   GLUCOSE 91 04/13/2014 0654   BUN 12 10/16/2021 1053   BUN 12 04/13/2014 0654   CREATININE 0.84 10/16/2021 1053   CALCIUM 9.4 10/16/2021 1053   CALCIUM 8.7 04/13/2014 0654   GFRNONAA 72 08/29/2020 0930   GFRAA 84 08/29/2020 0930    Lipid Panel     Component Value Date/Time   CHOL 198 10/16/2021 1053   CHOL 178 09/16/2012 0743   TRIG 74 10/16/2021 1053   TRIG 79 09/16/2012 0743   HDL 55 10/16/2021 1053   HDL 41 09/16/2012 0743   CHOLHDL 3.6 10/16/2021 1053   VLDL 17 09/09/2017 0739   VLDL 16 09/16/2012 0743   LDLCALC 126 (H) 10/16/2021 1053   LDLCALC 121 (H) 09/16/2012 0743    CBC    Component Value Date/Time   WBC 7.0 04/17/2021 0822   RBC 5.14 (H) 04/17/2021 0822   HGB 13.3 04/17/2021 0822   HGB 12.8 05/09/2011 1506   HCT 42.7 04/17/2021 0822   HCT 40.2 05/09/2011 1506   PLT 338 04/17/2021 0822   PLT 289 05/09/2011 1506   MCV 83.1 04/17/2021 0822   MCV 80 05/09/2011 1506   MCH 25.9 (L) 04/17/2021 0822   MCHC 31.1 (L) 04/17/2021 0822   RDW 13.7 04/17/2021 0822   RDW 13.7 05/09/2011 1506   LYMPHSABS 2,703 06/15/2019 0840   LYMPHSABS 2.3 05/09/2011  1506   MONOABS 1.0 (H) 09/09/2017 0739   MONOABS 0.6 05/09/2011 1506   EOSABS 50 06/15/2019 0840   EOSABS 0.2 05/09/2011 1506   BASOSABS 50 06/15/2019 0840   BASOSABS 0.0 05/09/2011 1506    Hgb A1C Lab Results  Component Value Date   HGBA1C 5.7 (H) 10/16/2021           Assessment & Plan:   Preventative Health Maintenance:  Flu shot UTD Tetanus UTD Encouraged her to get her COVID booster Discussed Shingrix vaccine, she will check coverage with her insurance company and schedule a visit if she would like to have this done She declines Pap smear  today Mammogram and bone density ordered-she will call to schedule Colon screening UTD Encouraged her to consume a balanced diet and exercise regimen Advised her to see an eye doctor and dentist annually We will check CBC, c-Met, lipid and A1c today  RTC in 2 weeks for nurse visit BP check 6 months, follow-up chronic conditions Webb Silversmith, NP

## 2022-04-23 NOTE — Assessment & Plan Note (Signed)
Encourage diet and exercise for weight loss 

## 2022-04-24 ENCOUNTER — Telehealth: Payer: Self-pay | Admitting: Internal Medicine

## 2022-04-24 DIAGNOSIS — E7800 Pure hypercholesterolemia, unspecified: Secondary | ICD-10-CM

## 2022-04-24 LAB — COMPLETE METABOLIC PANEL WITH GFR
AG Ratio: 1.6 (calc) (ref 1.0–2.5)
ALT: 11 U/L (ref 6–29)
AST: 14 U/L (ref 10–35)
Albumin: 4 g/dL (ref 3.6–5.1)
Alkaline phosphatase (APISO): 86 U/L (ref 37–153)
BUN: 10 mg/dL (ref 7–25)
CO2: 28 mmol/L (ref 20–32)
Calcium: 9.2 mg/dL (ref 8.6–10.4)
Chloride: 107 mmol/L (ref 98–110)
Creat: 0.78 mg/dL (ref 0.50–1.03)
Globulin: 2.5 g/dL (calc) (ref 1.9–3.7)
Glucose, Bld: 103 mg/dL — ABNORMAL HIGH (ref 65–99)
Potassium: 4.2 mmol/L (ref 3.5–5.3)
Sodium: 142 mmol/L (ref 135–146)
Total Bilirubin: 0.3 mg/dL (ref 0.2–1.2)
Total Protein: 6.5 g/dL (ref 6.1–8.1)
eGFR: 87 mL/min/{1.73_m2} (ref >=60)

## 2022-04-24 LAB — CBC
HCT: 40.2 % (ref 35.0–45.0)
Hemoglobin: 13 g/dL (ref 11.7–15.5)
MCH: 26.6 pg — ABNORMAL LOW (ref 27.0–33.0)
MCHC: 32.3 g/dL (ref 32.0–36.0)
MCV: 82.4 fL (ref 80.0–100.0)
MPV: 10.5 fL (ref 7.5–12.5)
Platelets: 320 10*3/uL (ref 140–400)
RBC: 4.88 10*6/uL (ref 3.80–5.10)
RDW: 13.7 % (ref 11.0–15.0)
WBC: 7.1 10*3/uL (ref 3.8–10.8)

## 2022-04-24 LAB — HEMOGLOBIN A1C
Hgb A1c MFr Bld: 5.8 % of total Hgb — ABNORMAL HIGH (ref <5.7)
Mean Plasma Glucose: 120 mg/dL
eAG (mmol/L): 6.6 mmol/L

## 2022-04-24 LAB — LIPID PANEL
Cholesterol: 190 mg/dL (ref <200)
HDL: 49 mg/dL — ABNORMAL LOW (ref >=50)
LDL Cholesterol (Calc): 125 mg/dL (calc) — ABNORMAL HIGH
Non-HDL Cholesterol (Calc): 141 mg/dL (calc) — ABNORMAL HIGH (ref <130)
Total CHOL/HDL Ratio: 3.9 (calc) (ref <5.0)
Triglycerides: 67 mg/dL (ref <150)

## 2022-04-24 NOTE — Telephone Encounter (Signed)
Pt is fine calling to report to Rollene Fare that she is fine adding the atorvastatin (LIPITOR) 20 MG tablet [[id number]]"   Please send to [place]

## 2022-04-25 ENCOUNTER — Ambulatory Visit: Payer: Self-pay | Admitting: *Deleted

## 2022-04-25 ENCOUNTER — Other Ambulatory Visit: Payer: Self-pay

## 2022-04-25 MED ORDER — AMLODIPINE BESYLATE 10 MG PO TABS: 10.0000 mg | ORAL_TABLET | Freq: Every day | ORAL | 1 refills | Status: DC

## 2022-04-25 MED ORDER — ATORVASTATIN CALCIUM 20 MG PO TABS
20.0000 mg | ORAL_TABLET | Freq: Every day | ORAL | 1 refills | Status: DC
  Filled 2022-04-25: qty 90, 90d supply, fill #0
  Filled 2022-07-21: qty 90, 90d supply, fill #1

## 2022-04-25 NOTE — Telephone Encounter (Signed)
Attempted to return her call.   Left a voicemail to call back. 

## 2022-04-25 NOTE — Addendum Note (Signed)
Addended by: Ashley Royalty E on: 04/25/2022 03:34 PM   Modules accepted: Orders

## 2022-04-25 NOTE — Telephone Encounter (Signed)
RX sent to pharmacy. Have her schedule lab only appt in 3 months for repeat lipid

## 2022-04-25 NOTE — Telephone Encounter (Signed)
Pt advised.  She will take two of the 5mg  for now.   Thanks,   -Mickel Baas

## 2022-04-25 NOTE — Telephone Encounter (Signed)
Go up to 10 mg

## 2022-04-25 NOTE — Telephone Encounter (Signed)
Pt advised.  She will call back and schedule.  She states she told you the wrong dose for amlodipine.  She was actually taking 10mg  daily.  Do you want her to increase her dose to 10mg  or just stay with the 5mg  for now?   Thanks,   -Mickel Baas

## 2022-04-25 NOTE — Telephone Encounter (Signed)
Message from Roslynn Amble sent at 04/25/2022  9:19 AM EST  Summary: Not tolerating amLODipine (NORVASC) 5 MG tablet   The patient called in stating she is not able to tolerate the amLODipine (NORVASC) 5 MG tablet. She states she just felt weird. Please assist patient further          Call History   Type Contact Phone/Fax User  04/25/2022 09:18 AM EST Phone (Incoming) Kelli Chapman, Kelli Chapman (Self) [phone] Lemmie Evens) Romeo Rabon P

## 2022-04-25 NOTE — Telephone Encounter (Signed)
  Chief Complaint: medication concern- SE? Symptoms: patient states she requested wrong dose of Amlodipine- she is actually supposed to be on 10 mg. Patient states she had only missed 1 day of medication and she took the 5 mg tablet yesterday- she states she had a "weird" feeling after taking it. Patient states it did subside and she felt better- but that is concerning to her. Patient wants to know if she should continue the 5mg  dose or increase to normal dose 10mg . Frequency: 1 episode yesterday Pertinent Negatives: Patient denies high BP reading yesterday- 139/73 Disposition: [] ED /[] Urgent Care (no appt availability in office) / [] Appointment(In office/virtual)/ []  [facility]/ [] Home Care/ [] Refused Recommended Disposition /[] [facility]/ [x]  Follow-up with PCP Additional Notes: Patient is calling for provider advice   Reason for Disposition . [1] Caller has URGENT medicine question about med that PCP or specialist prescribed AND [2] triager unable to answer question  Answer Assessment - Initial Assessment Questions 1. NAME of MEDICINE: "What medicine(s) are you calling about?"     Amlodipine 10 mg  2. QUESTION: "What is your question?" (e.g., double dose of medicine, side effect)     Patient states she had told PCP the wrong dose- 5 mg- when she took it (15-20 minutes later) and she felt "weird"-BP 139/76  she did feel better later 3. PRESCRIBER: "Who prescribed the medicine?" Reason: if prescribed by specialist, call should be referred to that group.     PCP 4. SYMPTOMS: "Do you have any symptoms?" If Yes, ask: "What symptoms are you having?"  "How bad are the symptoms (e.g., mild, moderate, severe)      Patient has not taken medication for today- she is nervous about taking it- patient wants provider recommendation- should she take 2 for full dose- patient states there are other variables- didn't take at normal time, had not eaten. Waiting Rx for cholesterol Rx  also.  Protocols used: Medication Question Call-A-AH

## 2022-04-25 NOTE — Addendum Note (Signed)
Addended by: Jearld Fenton on: 04/25/2022 12:46 PM   Modules accepted: Orders

## 2022-04-25 NOTE — Telephone Encounter (Signed)
Duplicate message. 

## 2022-05-07 ENCOUNTER — Ambulatory Visit: Payer: 59

## 2022-05-09 ENCOUNTER — Ambulatory Visit: Payer: 59

## 2022-05-09 NOTE — Progress Notes (Unsigned)
Hypertension, follow-up  BP Readings from Last 3 Encounters:  04/23/22 (!) 158/96  10/16/21 136/74  07/31/21 116/76   Wt Readings from Last 3 Encounters:  04/23/22 230 lb (104.3 kg)  10/16/21 236 lb (107 kg)  07/31/21 231 lb (104.8 kg)     She was last seen for hypertension 04/23/2022 BP at that visit was 158/96 Management since that visit includes restarting amlodipine 65m.  She reports {excellent/good/fair/poor:19665} compliance with treatment. She {is/is not:9024} having side effects. {document side effects if present:1}

## 2022-05-19 ENCOUNTER — Other Ambulatory Visit: Payer: Self-pay

## 2022-05-19 ENCOUNTER — Ambulatory Visit: Payer: 59 | Admitting: Podiatry

## 2022-05-19 ENCOUNTER — Ambulatory Visit (INDEPENDENT_AMBULATORY_CARE_PROVIDER_SITE_OTHER): Payer: 59

## 2022-05-19 DIAGNOSIS — M722 Plantar fascial fibromatosis: Secondary | ICD-10-CM

## 2022-05-19 DIAGNOSIS — B351 Tinea unguium: Secondary | ICD-10-CM

## 2022-05-19 DIAGNOSIS — M79674 Pain in right toe(s): Secondary | ICD-10-CM

## 2022-05-19 DIAGNOSIS — M7661 Achilles tendinitis, right leg: Secondary | ICD-10-CM

## 2022-05-19 DIAGNOSIS — M79675 Pain in left toe(s): Secondary | ICD-10-CM | POA: Diagnosis not present

## 2022-05-19 DIAGNOSIS — M9261 Juvenile osteochondrosis of tarsus, right ankle: Secondary | ICD-10-CM | POA: Diagnosis not present

## 2022-05-19 DIAGNOSIS — M62461 Contracture of muscle, right lower leg: Secondary | ICD-10-CM

## 2022-05-19 MED ORDER — MELOXICAM 15 MG PO TABS
15.0000 mg | ORAL_TABLET | Freq: Every day | ORAL | 3 refills | Status: DC
  Filled 2022-05-19 (×2): qty 30, 30d supply, fill #0
  Filled 2022-06-14: qty 30, 30d supply, fill #1
  Filled 2022-07-17: qty 30, 30d supply, fill #2
  Filled 2022-11-17: qty 30, 30d supply, fill #3

## 2022-05-19 MED ORDER — TERBINAFINE HCL 250 MG PO TABS
250.0000 mg | ORAL_TABLET | Freq: Every day | ORAL | 0 refills | Status: AC
  Filled 2022-05-19 (×2): qty 90, 90d supply, fill #0

## 2022-05-19 NOTE — Progress Notes (Signed)
  Subjective:  Patient ID: Kelli Chapman, female    DOB: [birth year],  MRN: [id number]  Chief Complaint  Patient presents with   Foot Pain    "My right heel hurts." N - heel pain L - posterior  D - 5> year O - suddenly, gotten worse C - sharp pain, throb A - the heavier I get, sit for a while and get up, standing a while T - ace bandages, heel protector   Nail Problem    "My toenails need worked on." N - toenails L - bilateral 1-5   D - 1 month O - gradually worse C - thick  A - none T - none    62 y.o. female presents with the above complaint. History confirmed with patient.  The nails are thickened elongated and starting to cause discomfort, they are discolored and have never looked normal.  Has sharp pain in the back of the heel feels like there is a hard lump here  Objective:  Physical Exam: warm, good capillary refill, no trophic changes or ulcerative lesions, normal DP and PT pulses, and normal sensory exam. Left Foot: dystrophic yellowed discolored nail plates with subungual debris Right Foot: tenderness at Achilles tendon insertion, dystrophic yellowed discolored nail plates with subungual debris, and palpable bony prominence posterior heel     Radiographs: Multiple views x-ray of the right foot: no fracture, dislocation, swelling or degenerative changes noted, plantar calcaneal spur, posterior calcaneal spur, and Haglund deformity noted Assessment:   1. Achilles tendinitis, right leg   2. Haglund's deformity of right heel   3. Gastrocnemius equinus of right lower extremity   4. Pain due to onychomycosis of toenails of both feet      Plan:  Patient was evaluated and treated and all questions answered.  Discussed the etiology and treatment options for the condition in detail with the patient. Educated patient on the topical and oral treatment options for mycotic nails. Recommended debridement of the nails today. Sharp and mechanical debridement performed of all  painful and mycotic nails today. Nails debrided in length and thickness using a nail nipper to level of comfort. Discussed treatment options including appropriate shoe gear. Follow up as needed for painful nails.  She would like to pursue medical treatment.  We discussed use of Lamisil.  9-day Rx sent to pharmacy.  Reviewed metabolic panel on [id number] showed normal renal and hepatic function  Discussed the etiology and treatment options for Achilles tendinitis including stretching, formal physical therapy with an eccentric exercises therapy plan, supportive shoegears such as a running shoe or sneaker, heel lifts, topical and oral medications.  We also discussed that I do not routinely perform injections in this area because of the risk of an increased damage or rupture of the tendon.  We also discussed the role of surgical treatment of this for patients who do not improve after exhausting non-surgical treatment options.  -XR reviewed with patient -Educated on stretching and icing of the affected limb. -Rx for Meloxicam. Advised on risks, benefits, and alternatives of the medication -Advised if not improving at that recommendation of physical therapy and possible CAM boot immobilization may be beneficial   Return in about 3 months (around 08/17/2022) for re-check Achilles tendon, follow up after nail fungus treatment.

## 2022-05-19 NOTE — Patient Instructions (Signed)
Look for Voltaren gel at the pharmacy over the counter or online (also known as diclofenac 1% gel). Apply to the painful areas 3-4x daily with the supplied dosing card. Allow to dry for 10 minutes before going into socks/shoes   Achilles Tendinitis  with Rehab Achilles tendinitis is a disorder of the Achilles tendon. The Achilles tendon connects the large calf muscles (Gastrocnemius and Soleus) to the heel bone (calcaneus). This tendon is sometimes called the heel cord. It is important for pushing-off and standing on your toes and is important for walking, running, or jumping. Tendinitis is often caused by overuse and repetitive microtrauma. SYMPTOMS Pain, tenderness, swelling, warmth, and redness may occur over the Achilles tendon even at rest. Pain with pushing off, or flexing or extending the ankle. Pain that is worsened after or during activity. CAUSES  Overuse sometimes seen with rapid increase in exercise programs or in sports requiring running and jumping. Poor physical conditioning (strength and flexibility or endurance). Running sports, especially training running down hills. Inadequate warm-up before practice or play or failure to stretch before participation. Injury to the tendon. PREVENTION  Warm up and stretch before practice or competition. Allow time for adequate rest and recovery between practices and competition. Keep up conditioning. Keep up ankle and leg flexibility. Improve or keep muscle strength and endurance. Improve cardiovascular fitness. Use proper technique. Use proper equipment (shoes, skates). To help prevent recurrence, taping, protective strapping, or an adhesive bandage may be recommended for several weeks after healing is complete. PROGNOSIS  Recovery may take weeks to several months to heal. Longer recovery is expected if symptoms have been prolonged. Recovery is usually quicker if the inflammation is due to a direct blow as compared with overuse or  sudden strain. RELATED COMPLICATIONS  Healing time will be prolonged if the condition is not correctly treated. The injury must be given plenty of time to heal. Symptoms can reoccur if activity is resumed too soon. Untreated, tendinitis may increase the risk of tendon rupture requiring additional time for recovery and possibly surgery. TREATMENT  The first treatment consists of rest anti-inflammatory medication, and ice to relieve the pain. Stretching and strengthening exercises after resolution of pain will likely help reduce the risk of recurrence. Referral to a physical therapist or athletic trainer for further evaluation and treatment may be helpful. A walking boot or cast may be recommended to rest the Achilles tendon. This can help break the cycle of inflammation and microtrauma. Arch supports (orthotics) may be prescribed or recommended by your caregiver as an adjunct to therapy and rest. Surgery to remove the inflamed tendon lining or degenerated tendon tissue is rarely necessary and has shown less than predictable results. MEDICATION  Nonsteroidal anti-inflammatory medications, such as aspirin and ibuprofen, may be used for pain and inflammation relief. Do not take within 7 days before surgery. Take these as directed by your caregiver. Contact your caregiver immediately if any bleeding, stomach upset, or signs of allergic reaction occur. Other minor pain relievers, such as acetaminophen, may also be used. Pain relievers may be prescribed as necessary by your caregiver. Do not take prescription pain medication for longer than 4 to 7 days. Use only as directed and only as much as you need. Cortisone injections are rarely indicated. Cortisone injections may weaken tendons and predispose to rupture. It is better to give the condition more time to heal than to use them. HEAT AND COLD Cold is used to relieve pain and reduce inflammation for acute and chronic Achilles  tendinitis. Cold should be  applied for 10 to 15 minutes every 2 to 3 hours for inflammation and pain and immediately after any activity that aggravates your symptoms. Use ice packs or an ice massage. Heat may be used before performing stretching and strengthening activities prescribed by your caregiver. Use a heat pack or a warm soak. SEEK MEDICAL CARE IF: Symptoms get worse or do not improve in 2 weeks despite treatment. New, unexplained symptoms develop. Drugs used in treatment may produce side effects.  EXERCISES:  RANGE OF MOTION (ROM) AND STRETCHING EXERCISES - Achilles Tendinitis  These exercises may help you when beginning to rehabilitate your injury. Your symptoms may resolve with or without further involvement from your physician, physical therapist or athletic trainer. While completing these exercises, remember:  Restoring tissue flexibility helps normal motion to return to the joints. This allows healthier, less painful movement and activity. An effective stretch should be held for at least 30 seconds. A stretch should never be painful. You should only feel a gentle lengthening or release in the stretched tissue.  STRETCH  Gastroc, Standing  Place hands on wall. Extend right / left leg, keeping the front knee somewhat bent. Slightly point your toes inward on your back foot. Keeping your right / left heel on the floor and your knee straight, shift your weight toward the wall, not allowing your back to arch. You should feel a gentle stretch in the right / left calf. Hold this position for 10 seconds. Repeat 3 times. Complete this stretch 2 times per day.  STRETCH  Soleus, Standing  Place hands on wall. Extend right / left leg, keeping the other knee somewhat bent. Slightly point your toes inward on your back foot. Keep your right / left heel on the floor, bend your back knee, and slightly shift your weight over the back leg so that you feel a gentle stretch deep in your back calf. Hold this position for 10  seconds. Repeat 3 times. Complete this stretch 2 times per day.  STRETCH  Gastrocsoleus, Standing  Note: This exercise can place a lot of stress on your foot and ankle. Please complete this exercise only if specifically instructed by your caregiver.  Place the ball of your right / left foot on a step, keeping your other foot firmly on the same step. Hold on to the wall or a rail for balance. Slowly lift your other foot, allowing your body weight to press your heel down over the edge of the step. You should feel a stretch in your right / left calf. Hold this position for 10 seconds. Repeat this exercise with a slight bend in your knee. Repeat 3 times. Complete this stretch 2 times per day.   STRENGTHENING EXERCISES - Achilles Tendinitis These exercises may help you when beginning to rehabilitate your injury. They may resolve your symptoms with or without further involvement from your physician, physical therapist or athletic trainer. While completing these exercises, remember:  Muscles can gain both the endurance and the strength needed for everyday activities through controlled exercises. Complete these exercises as instructed by your physician, physical therapist or athletic trainer. Progress the resistance and repetitions only as guided. You may experience muscle soreness or fatigue, but the pain or discomfort you are trying to eliminate should never worsen during these exercises. If this pain does worsen, stop and make certain you are following the directions exactly. If the pain is still present after adjustments, discontinue the exercise until you can discuss the  trouble with your clinician.  STRENGTH - Plantar-flexors  Sit with your right / left leg extended. Holding onto both ends of a rubber exercise band/tubing, loop it around the ball of your foot. Keep a slight tension in the band. Slowly push your toes away from you, pointing them downward. Hold this position for 10 seconds. Return  slowly, controlling the tension in the band/tubing. Repeat 3 times. Complete this exercise 2 times per day.   STRENGTH - Plantar-flexors  Stand with your feet shoulder width apart. Steady yourself with a wall or table using as little support as needed. Keeping your weight evenly spread over the width of your feet, rise up on your toes.* Hold this position for 10 seconds. Repeat 3 times. Complete this exercise 2 times per day.  *If this is too easy, shift your weight toward your right / left leg until you feel challenged. Ultimately, you may be asked to do this exercise with your right / left foot only.  STRENGTH  Plantar-flexors, Eccentric  Note: This exercise can place a lot of stress on your foot and ankle. Please complete this exercise only if specifically instructed by your caregiver.  Place the balls of your feet on a step. With your hands, use only enough support from a wall or rail to keep your balance. Keep your knees straight and rise up on your toes. Slowly shift your weight entirely to your right / left toes and pick up your opposite foot. Gently and with controlled movement, lower your weight through your right / left foot so that your heel drops below the level of the step. You will feel a slight stretch in the back of your calf at the end position. Use the healthy leg to help rise up onto the balls of both feet, then lower weight only on the right / left leg again. Build up to 15 repetitions. Then progress to 3 consecutive sets of 15 repetitions.* After completing the above exercise, complete the same exercise with a slight knee bend (about 30 degrees). Again, build up to 15 repetitions. Then progress to 3 consecutive sets of 15 repetitions.* Perform this exercise 2 times per day.  *When you easily complete 3 sets of 15, your physician, physical therapist or athletic trainer may advise you to add resistance by wearing a backpack filled with additional weight.  STRENGTH - Plantar  Flexors, Seated  Sit on a chair that allows your feet to rest flat on the ground. If necessary, sit at the edge of the chair. Keeping your toes firmly on the ground, lift your right / left heel as far as you can without increasing any discomfort in your ankle. Repeat 3 times. Complete this exercise 2 times a day.    Tuli's heel cups may help keep pressure off the heel

## 2022-06-09 ENCOUNTER — Other Ambulatory Visit: Payer: Self-pay

## 2022-06-09 ENCOUNTER — Other Ambulatory Visit: Payer: Self-pay | Admitting: Internal Medicine

## 2022-06-09 NOTE — Telephone Encounter (Signed)
Medication Refill - Medication: Amlodipine 10 mg  Has the patient contacted their pharmacy? Yes.   (Agent: If no, request that the patient contact the pharmacy for the refill. If patient does not wish to contact the pharmacy document the reason why and proceed with request.) (Agent: If yes, when and what did the pharmacy advise?)  Preferred Pharmacy (with phone number or street name): [facility] Has the patient been seen for an appointment in the last year OR does the patient have an upcoming appointment? Yes.    Agent: Please be advised that RX refills may take up to 3 business days. We ask that you follow-up with your pharmacy.

## 2022-06-10 ENCOUNTER — Other Ambulatory Visit: Payer: Self-pay

## 2022-06-10 ENCOUNTER — Telehealth: Payer: Self-pay | Admitting: Internal Medicine

## 2022-06-10 MED ORDER — AMLODIPINE BESYLATE 10 MG PO TABS
10.0000 mg | ORAL_TABLET | Freq: Every day | ORAL | 0 refills | Status: DC
  Filled 2022-06-10: qty 90, 90d supply, fill #0

## 2022-06-10 NOTE — Telephone Encounter (Signed)
[place] pharmacy and spoke with Jeannene Patella. She states that Amlodipine 5 mg was last refilled 04/23/22 then discontinued 04/25/22 by provider. Prescription was changed to 10 mg, will refill the 10 mg. To [facility] pharmacy.

## 2022-06-10 NOTE — Telephone Encounter (Signed)
Requested Prescriptions  Pending Prescriptions Disp Refills   amLODipine (NORVASC) 10 MG tablet 90 tablet 0    Sig: Take 1 tablet (10 mg total) by mouth daily.     Cardiovascular: Calcium Channel Blockers 2 Passed - 06/09/2022  9:44 AM      Passed - Last BP in normal range    BP Readings from Last 1 Encounters:  05/09/22 118/70         Passed - Last Heart Rate in normal range    Pulse Readings from Last 1 Encounters:  04/23/22 69         Passed - Valid encounter within last 6 months    Recent Outpatient Visits           1 month ago Encounter for general adult medical examination with abnormal findings   [facility] [place], Coralie Keens, NP   7 months ago [facility] [place], [place], NP   10 months ago Acute pain of left shoulder   [facility] [place], Coralie Keens, NP   1 year ago Encounter for general adult medical examination with abnormal findings   [facility] [place], Coralie Keens, NP   1 year ago Mass of right axilla   [facility] [place], Coralie Keens, NP

## 2022-08-11 ENCOUNTER — Other Ambulatory Visit: Payer: 59

## 2022-08-11 ENCOUNTER — Ambulatory Visit: Payer: 59 | Admitting: Podiatry

## 2022-08-12 ENCOUNTER — Ambulatory Visit (INDEPENDENT_AMBULATORY_CARE_PROVIDER_SITE_OTHER): Payer: 59 | Admitting: Internal Medicine

## 2022-08-12 ENCOUNTER — Other Ambulatory Visit: Payer: Self-pay

## 2022-08-12 ENCOUNTER — Encounter: Payer: Self-pay | Admitting: Internal Medicine

## 2022-08-12 VITALS — BP 122/76 | HR 70 | Temp 96.8°F | Wt 236.0 lb

## 2022-08-12 DIAGNOSIS — I10 Essential (primary) hypertension: Secondary | ICD-10-CM | POA: Diagnosis not present

## 2022-08-12 DIAGNOSIS — E7800 Pure hypercholesterolemia, unspecified: Secondary | ICD-10-CM | POA: Diagnosis not present

## 2022-08-12 DIAGNOSIS — R7303 Prediabetes: Secondary | ICD-10-CM | POA: Diagnosis not present

## 2022-08-12 DIAGNOSIS — Z6841 Body Mass Index (BMI) 40.0 and over, adult: Secondary | ICD-10-CM | POA: Diagnosis not present

## 2022-08-12 MED ORDER — AMLODIPINE BESYLATE 10 MG PO TABS
10.0000 mg | ORAL_TABLET | Freq: Every day | ORAL | 1 refills | Status: DC
  Filled 2022-08-12 – 2022-08-20 (×2): qty 90, 90d supply, fill #0
  Filled 2022-12-08: qty 90, 90d supply, fill #1

## 2022-08-12 MED ORDER — ATORVASTATIN CALCIUM 20 MG PO TABS
20.0000 mg | ORAL_TABLET | Freq: Every day | ORAL | 1 refills | Status: DC
  Filled 2022-08-12 – 2022-11-10 (×3): qty 90, 90d supply, fill #0
  Filled 2023-02-08: qty 90, 90d supply, fill #1

## 2022-08-12 NOTE — Assessment & Plan Note (Signed)
Encourage diet and exercise for weight loss 

## 2022-08-12 NOTE — Assessment & Plan Note (Signed)
C-Met and lipid profile today Encouraged her to consume a low-fat diet Continue atorvastatin 

## 2022-08-12 NOTE — Assessment & Plan Note (Signed)
A1c today Encouraged carb diet and exercise for weight loss 

## 2022-08-12 NOTE — Patient Instructions (Signed)

## 2022-08-12 NOTE — Progress Notes (Signed)
Subjective:    Patient ID: Kelli Chapman, female    DOB: [birth year], 62 y.o.   MRN: [id number]  HPI  Patient presents to clinic today for follow-up of chronic conditions.  HTN: Her BP today 122/70. She is taking Amlodipine as prescribed. ECG from 01/2019 reviewed.  Plantar Fasciitis: She is taking Meloxicam as prescribed. She follows with podiatry.   HLD: Her last LDL was 125, triglycerides 67, 03/2022.  She denies myalgias on Atorvastatin. She tries to consume a low fat diet.  Prediabetes: Her last A1c was 5.8%, 03/2022.  She is not taking any oral diabetic medication at this time.  She does not check her sugars.  Review of Systems     Past Medical History:  Diagnosis Date   Arthritis    right shoulder   Hyperlipidemia    Hypertension     Current Outpatient Medications  Medication Sig Dispense Refill   amLODipine (NORVASC) 10 MG tablet Take 1 tablet (10 mg total) by mouth daily. 90 tablet 0   atorvastatin (LIPITOR) 20 MG tablet Take 1 tablet (20 mg total) by mouth daily. 90 tablet 1   meloxicam (MOBIC) 15 MG tablet Take 1 tablet (15 mg total) by mouth daily. 30 tablet 3   terbinafine (LAMISIL) 250 MG tablet Take 1 tablet (250 mg total) by mouth daily. 90 tablet 0   No current facility-administered medications for this visit.    No Known Allergies  Family History  Problem Relation Age of Onset   Hypertension Mother    Heart disease Mother    Hypertension Father    Diabetes Sister    Hypertension Sister    Kidney disease Paternal Grandfather    Breast cancer Paternal Aunt    Colon cancer Neg Hx     Social History   Socioeconomic History   Marital status: Widowed    Spouse name: Not on file   Number of children: Not on file   Years of education: Not on file   Highest education level: Not on file  Occupational History   Not on file  Tobacco Use   Smoking status: Never   Smokeless tobacco: Never  Vaping Use   Vaping Use: Never used  Substance and Sexual  Activity   Alcohol use: Not Currently   Drug use: Never   Sexual activity: Yes    Partners: Male  Other Topics Concern   Not on file  Social History Narrative   Not on file   Social Determinants of Health   Financial Resource Strain: Not on file  Food Insecurity: Not on file  Transportation Needs: Not on file  Physical Activity: Not on file  Stress: Not on file  Social Connections: Not on file  Intimate Partner Violence: Not on file     Constitutional: Denies fever, malaise, fatigue, headache or abrupt weight changes.  HEENT: Denies eye pain, eye redness, ear pain, ringing in the ears, wax buildup, runny nose, nasal congestion, bloody nose, or sore throat. Respiratory: Denies difficulty breathing, shortness of breath, cough or sputum production.   Cardiovascular: Denies chest pain, chest tightness, palpitations or swelling in the hands or feet.  Gastrointestinal: Denies abdominal pain, bloating, constipation, diarrhea or blood in the stool.  GU: Denies urgency, frequency, pain with urination, burning sensation, blood in urine, odor or discharge. Musculoskeletal: Patient reports left heel pain.  Denies decrease in range of motion, difficulty with gait, muscle pain or joint swelling.  Skin: Denies redness, rashes, lesions or ulcercations.  Neurological:  Denies dizziness, difficulty with memory, difficulty with speech or problems with balance and coordination.  Psych: Denies anxiety, depression, SI/HI.  No other specific complaints in a complete review of systems (except as listed in HPI above).  Objective:   Physical Exam  BP 122/76 (BP Location: Left Arm, Patient Position: Sitting, Cuff Size: Large)   Pulse 70   Temp (!) 96.8 F (36 C) (Temporal)   Wt 236 lb (107 kg)   SpO2 99%   BMI 40.51 kg/m   Wt Readings from Last 3 Encounters:  04/23/22 230 lb (104.3 kg)  10/16/21 236 lb (107 kg)  07/31/21 231 lb (104.8 kg)    General: Appears her stated age, obese in  NAD. HEENT: Head: normal shape and size; Eyes: sclera white, no icterus, conjunctiva pink, PERRLA and EOMs intact;  Cardiovascular: Normal rate and rhythm. S1,S2 noted.  No murmur, rubs or gallops noted. No JVD or BLE edema. No carotid bruits noted. Pulmonary/Chest: Normal effort and positive vesicular breath sounds. No respiratory distress. No wheezes, rales or ronchi noted.  Musculoskeletal:  No difficulty with gait.  Neurological: Alert and oriented.  Coordination normal.     BMET    Component Value Date/Time   NA 142 04/23/2022 0852   NA 141 04/13/2014 0654   K 4.2 04/23/2022 0852   K 3.5 04/13/2014 0654   CL 107 04/23/2022 0852   CL 106 04/13/2014 0654   CO2 28 04/23/2022 0852   CO2 29 04/13/2014 0654   GLUCOSE 103 (H) 04/23/2022 0852   GLUCOSE 91 04/13/2014 0654   BUN 10 04/23/2022 0852   BUN 12 04/13/2014 0654   CREATININE 0.78 04/23/2022 0852   CALCIUM 9.2 04/23/2022 0852   CALCIUM 8.7 04/13/2014 0654   GFRNONAA 72 08/29/2020 0930   GFRAA 84 08/29/2020 0930    Lipid Panel     Component Value Date/Time   CHOL 190 04/23/2022 0852   CHOL 178 09/16/2012 0743   TRIG 67 04/23/2022 0852   TRIG 79 09/16/2012 0743   HDL 49 (L) 04/23/2022 0852   HDL 41 09/16/2012 0743   CHOLHDL 3.9 04/23/2022 0852   VLDL 17 09/09/2017 0739   VLDL 16 09/16/2012 0743   LDLCALC 125 (H) 04/23/2022 0852   LDLCALC 121 (H) 09/16/2012 0743    CBC    Component Value Date/Time   WBC 7.1 04/23/2022 0852   RBC 4.88 04/23/2022 0852   HGB 13.0 04/23/2022 0852   HGB 12.8 05/09/2011 1506   HCT 40.2 04/23/2022 0852   HCT 40.2 05/09/2011 1506   PLT 320 04/23/2022 0852   PLT 289 05/09/2011 1506   MCV 82.4 04/23/2022 0852   MCV 80 05/09/2011 1506   MCH 26.6 (L) 04/23/2022 0852   MCHC 32.3 04/23/2022 0852   RDW 13.7 04/23/2022 0852   RDW 13.7 05/09/2011 1506   LYMPHSABS 2,703 06/15/2019 0840   LYMPHSABS 2.3 05/09/2011 1506   MONOABS 1.0 (H) 09/09/2017 0739   MONOABS 0.6 05/09/2011 1506    EOSABS 50 06/15/2019 0840   EOSABS 0.2 05/09/2011 1506   BASOSABS 50 06/15/2019 0840   BASOSABS 0.0 05/09/2011 1506    Hgb A1C Lab Results  Component Value Date   HGBA1C 5.8 (H) 04/23/2022           Assessment & Plan:    RTC in 9 months for annual exam  Nicki Reaper, NP

## 2022-08-12 NOTE — Assessment & Plan Note (Signed)
Controlled on amlodipine Reinforced DASH diet and exercise for weight loss C-Met today 

## 2022-08-13 LAB — HEMOGLOBIN A1C
Hgb A1c MFr Bld: 6 %{Hb} — ABNORMAL HIGH
Mean Plasma Glucose: 126 mg/dL
eAG (mmol/L): 7 mmol/L

## 2022-08-13 LAB — COMPLETE METABOLIC PANEL WITH GFR
AG Ratio: 1.4 (calc) (ref 1.0–2.5)
ALT: 15 U/L (ref 6–29)
AST: 18 U/L (ref 10–35)
Albumin: 3.9 g/dL (ref 3.6–5.1)
Alkaline phosphatase (APISO): 106 U/L (ref 37–153)
BUN: 14 mg/dL (ref 7–25)
CO2: 29 mmol/L (ref 20–32)
Calcium: 9.4 mg/dL (ref 8.6–10.4)
Chloride: 107 mmol/L (ref 98–110)
Creat: 0.8 mg/dL (ref 0.50–1.05)
Globulin: 2.8 g/dL (calc) (ref 1.9–3.7)
Glucose, Bld: 101 mg/dL — ABNORMAL HIGH (ref 65–99)
Potassium: 4.1 mmol/L (ref 3.5–5.3)
Sodium: 145 mmol/L (ref 135–146)
Total Bilirubin: 0.3 mg/dL (ref 0.2–1.2)
Total Protein: 6.7 g/dL (ref 6.1–8.1)
eGFR: 84 mL/min/{1.73_m2} (ref >=60)

## 2022-08-13 LAB — CBC
HCT: 38.5 % (ref 35.0–45.0)
Hemoglobin: 12.1 g/dL (ref 11.7–15.5)
MCH: 25.9 pg — ABNORMAL LOW (ref 27.0–33.0)
MCHC: 31.4 g/dL — ABNORMAL LOW (ref 32.0–36.0)
MCV: 82.4 fL (ref 80.0–100.0)
MPV: 10.2 fL (ref 7.5–12.5)
Platelets: 320 10*3/uL (ref 140–400)
RBC: 4.67 Million/uL (ref 3.80–5.10)
RDW: 14.1 % (ref 11.0–15.0)
WBC: 6.4 10*3/uL (ref 3.8–10.8)

## 2022-08-13 LAB — LIPID PANEL
Cholesterol: 154 mg/dL (ref <200)
HDL: 54 mg/dL (ref >=50)
LDL Cholesterol (Calc): 86 mg/dL (calc)
Non-HDL Cholesterol (Calc): 100 mg/dL (calc) (ref <130)
Total CHOL/HDL Ratio: 2.9 (calc) (ref <5.0)
Triglycerides: 59 mg/dL (ref <150)

## 2022-08-14 ENCOUNTER — Ambulatory Visit: Payer: 59 | Admitting: Podiatry

## 2022-08-14 ENCOUNTER — Other Ambulatory Visit: Payer: Self-pay

## 2022-08-14 DIAGNOSIS — M7662 Achilles tendinitis, left leg: Secondary | ICD-10-CM

## 2022-08-14 MED ORDER — MELOXICAM 15 MG PO TABS
15.0000 mg | ORAL_TABLET | Freq: Every day | ORAL | 0 refills | Status: DC
  Filled 2022-08-14: qty 30, 30d supply, fill #0

## 2022-08-14 NOTE — Progress Notes (Signed)
  Subjective:  Patient ID: Kelli Chapman, female    DOB: 1960/09/30,  MRN: [id number]  Chief Complaint  Patient presents with   Foot Pain    Pt stated that she is doing much better  Would like her nails trimmed     62 y.o. female presents with the above complaint. History confirmed with patient.  She states he is on Lamisil for the nail fungus.  The Achilles tendon is doing really well.  Her pain has completely resolved.  Objective:  Physical Exam: warm, good capillary refill, no trophic changes or ulcerative lesions, normal DP and PT pulses, and normal sensory exam. Left Foot: dystrophic yellowed discolored nail plates with subungual debris Right Foot: tenderness at Achilles tendon insertion, dystrophic yellowed discolored nail plates with subungual debris, and palpable bony prominence posterior heel  Radiographs: Multiple views x-ray of the right foot: no fracture, dislocation, swelling or degenerative changes noted, plantar calcaneal spur, posterior calcaneal spur, and Haglund deformity noted Assessment:   1. Achilles tendinitis, left leg       Plan:  Patient was evaluated and treated and all questions answered.  Discussed the etiology and treatment options for the condition in detail with the patient. Educated patient on the topical and oral treatment options for mycotic nails. Recommended debridement of the nails today. Sharp and mechanical debridement performed of all painful and mycotic nails today. Nails debrided in length and thickness using a nail nipper to level of comfort. Discussed treatment options including appropriate shoe gear. Follow up as needed for painful nails.  She would like to pursue medical treatment.  We discussed use of Lamisil.  9-day Rx sent to pharmacy.  Reviewed metabolic panel on 04/23/2022 showed normal renal and hepatic function  Left Achilles tendinitis -Clinically healed with meloxicam.  At this time I encourage if any foot and ankle issues on future  she will come back and see me.  Refill of meloxicam was sent to the pharmacy.   No follow-ups on file.

## 2022-08-20 ENCOUNTER — Other Ambulatory Visit: Payer: Self-pay

## 2022-08-21 ENCOUNTER — Other Ambulatory Visit: Payer: Self-pay

## 2022-08-26 ENCOUNTER — Other Ambulatory Visit: Payer: Self-pay

## 2022-11-10 ENCOUNTER — Other Ambulatory Visit: Payer: Self-pay

## 2022-11-25 ENCOUNTER — Ambulatory Visit
Admission: RE | Admit: 2022-11-25 | Discharge: 2022-11-25 | Disposition: A | Discharge status: Home / Self Care | Payer: 59 | Source: Ambulatory Visit | Attending: Internal Medicine | Admitting: Internal Medicine

## 2022-11-25 DIAGNOSIS — Z780 Asymptomatic menopausal state: Secondary | ICD-10-CM | POA: Diagnosis not present

## 2022-11-25 DIAGNOSIS — Z1231 Encounter for screening mammogram for malignant neoplasm of breast: Secondary | ICD-10-CM

## 2022-11-25 DIAGNOSIS — M85852 Other specified disorders of bone density and structure, left thigh: Secondary | ICD-10-CM | POA: Diagnosis not present

## 2023-03-12 ENCOUNTER — Other Ambulatory Visit: Payer: Self-pay

## 2023-03-12 ENCOUNTER — Other Ambulatory Visit: Payer: Self-pay | Admitting: Internal Medicine

## 2023-03-12 ENCOUNTER — Emergency Department
Admission: EM | Admit: 2023-03-12 | Discharge: 2023-03-12 | Disposition: A | Discharge status: Home / Self Care | Payer: 59 | Attending: Emergency Medicine | Admitting: Emergency Medicine

## 2023-03-12 ENCOUNTER — Encounter: Payer: Self-pay | Admitting: Emergency Medicine

## 2023-03-12 DIAGNOSIS — I10 Essential (primary) hypertension: Secondary | ICD-10-CM | POA: Diagnosis not present

## 2023-03-12 DIAGNOSIS — Z79899 Other long term (current) drug therapy: Secondary | ICD-10-CM | POA: Diagnosis not present

## 2023-03-12 DIAGNOSIS — B029 Zoster without complications: Secondary | ICD-10-CM | POA: Insufficient documentation

## 2023-03-12 DIAGNOSIS — R21 Rash and other nonspecific skin eruption: Secondary | ICD-10-CM | POA: Diagnosis not present

## 2023-03-12 MED ORDER — ONDANSETRON 4 MG PO TBDP
4.0000 mg | ORAL_TABLET | Freq: Four times a day (QID) | ORAL | 0 refills | Status: DC | PRN
  Filled 2023-03-12: qty 20, 5d supply, fill #0

## 2023-03-12 MED ORDER — VALACYCLOVIR HCL 1 G PO TABS
1000.0000 mg | ORAL_TABLET | Freq: Three times a day (TID) | ORAL | 0 refills | Status: DC
  Filled 2023-03-12: qty 21, 7d supply, fill #0

## 2023-03-12 MED ORDER — AMLODIPINE BESYLATE 10 MG PO TABS
10.0000 mg | ORAL_TABLET | Freq: Every day | ORAL | 0 refills | Status: DC
  Filled 2023-03-12: qty 90, 90d supply, fill #0

## 2023-03-12 MED ORDER — OXYCODONE-ACETAMINOPHEN 5-325 MG PO TABS
1.0000 | ORAL_TABLET | Freq: Four times a day (QID) | ORAL | 0 refills | Status: DC | PRN
  Filled 2023-03-12: qty 16, 4d supply, fill #0

## 2023-03-12 MED ORDER — VALACYCLOVIR HCL 500 MG PO TABS
1000.0000 mg | ORAL_TABLET | Freq: Once | ORAL | Status: AC
  Administered 2023-03-12: 1000 mg via ORAL
  Filled 2023-03-12: qty 2

## 2023-03-12 NOTE — ED Triage Notes (Signed)
Patient ambulatory to triage with steady gait, without difficulty or distress noted; pt reports ?shingles; st has had a rash to left side that is intermittently itchy and painful--denies symptoms at present

## 2023-03-12 NOTE — Telephone Encounter (Signed)
Requested Prescriptions  Pending Prescriptions Disp Refills   amLODipine (NORVASC) 10 MG tablet 90 tablet 0    Sig: Take 1 tablet (10 mg total) by mouth daily.     Cardiovascular: Calcium Channel Blockers 2 Failed - 03/12/2023 11:05 AM      Failed - Valid encounter within last 6 months    Recent Outpatient Visits           7 months ago Prediabetes   [place] [facility] [place], Salvadore Oxford, NP   10 months ago Encounter for general adult medical examination with abnormal findings   [place] [facility] [place], Salvadore Oxford, NP   1 year ago Prediabetes   [facility] [place], Salvadore Oxford, NP   1 year ago Acute pain of left shoulder   [place] [facility] [place], Salvadore Oxford, NP   1 year ago Encounter for general adult medical examination with abnormal findings   [facility] [place], Salvadore Oxford, NP       Future Appointments             In 1 month Baity, Salvadore Oxford, NP [facility]            Passed - Last BP in normal range    BP Readings from Last 1 Encounters:  03/12/23 117/81         Passed - Last Heart Rate in normal range    Pulse Readings from Last 1 Encounters:  03/12/23 78

## 2023-03-12 NOTE — ED Provider Notes (Signed)
[facility] Provider Note    Event Date/Time   First MD Initiated Contact with Patient 03/12/23 [phone]     (approximate)   History   Rash   HPI  Kelli Chapman is a 62 y.o. female history of hypertension, hyperlipidemia who presents to the emergency department with itchy, vesicular rash to the left back.  No fever.  No new exposures.  She was concerned she had shingles.  Symptoms just started today.  She is not having any pain yet.   History provided by patient.    Past Medical History:  Diagnosis Date   Arthritis    right shoulder   Hyperlipidemia    Hypertension     Past Surgical History:  Procedure Laterality Date   BREAST BIOPSY Right 12/12/2020   [place] bx, hydro marker coil shape, path pending   SHOULDER ARTHROSCOPY WITH OPEN ROTATOR CUFF REPAIR Right 03/18/2016   Procedure: SHOULDER ARTHROSCOPY WITH OPEN ROTATOR CUFF REPAIR Label debridement, Decompression;  Surgeon: Christena Flake, MD;  Location: [facility] ORS;  Service: Orthopedics;  Laterality: Right;    MEDICATIONS:  Prior to Admission medications   Medication Sig Start Date End Date Taking? Authorizing Provider  amLODipine (NORVASC) 10 MG tablet Take 1 tablet (10 mg total) by mouth daily. 08/12/22   Lorre Munroe, NP  atorvastatin (LIPITOR) 20 MG tablet Take 1 tablet (20 mg total) by mouth daily. 08/12/22   Lorre Munroe, NP  meloxicam (MOBIC) 15 MG tablet Take 1 tablet (15 mg total) by mouth daily. 05/19/22   McDonald, Rachelle Hora, DPM  meloxicam (MOBIC) 15 MG tablet Take 1 tablet (15 mg total) by mouth daily. 08/14/22   Candelaria Stagers, DPM    Physical Exam   Triage Vital Signs: ED Triage Vitals  Encounter Vitals Group     BP 03/12/23 0615 117/81     Systolic BP Percentile --      Diastolic BP Percentile --      Pulse Rate 03/12/23 0615 78     Resp 03/12/23 0615 18     Temp 03/12/23 0615 98.1 F (36.7 C)     Temp Source 03/12/23 0615 Oral     SpO2 03/12/23 0615 100 %     Weight 03/12/23  0616 232 lb (105.2 kg)     Height 03/12/23 0616 5\' 4"  (1.626 m)     Head Circumference --      Peak Flow --      Pain Score 03/12/23 0616 0     Pain Loc --      Pain Education --      Exclude from Growth Chart --     Most recent vital signs: Vitals:   03/12/23 0615  BP: 117/81  Pulse: 78  Resp: 18  Temp: 98.1 F (36.7 C)  SpO2: 100%     CONSTITUTIONAL: Alert and responds appropriately to questions. Well-appearing; well-nourished HEAD: Normocephalic, atraumatic EYES: Conjunctivae clear, pupils appear equal ENT: normal nose; moist mucous membranes NECK: Normal range of motion CARD: Regular rate and rhythm RESP: Normal chest excursion without splinting or tachypnea; no hypoxia or respiratory distress, speaking full sentences ABD/GI: non-distended EXT: Normal ROM in all joints, no major deformities noted SKIN: Normal color for age and race, patient has an erythematous lightly raised rash with 2 small vesicles falling in the T6 or T7 dermatome to the left thoracic back and left lateral rib area.  Does not extend under the breast.  Does not cross  midline.  No induration or fluctuance.  No blisters.  No hives.  No petechiae or purpura. NEURO: Moves all extremities equally, normal speech, no facial asymmetry noted PSYCH: The patient's mood and manner are appropriate. Grooming and personal hygiene are appropriate.  ED Results / Procedures / Treatments   LABS: (all labs ordered are listed, but only abnormal results are displayed) Labs Reviewed - No data to display   EKG:  EKG Interpretation Date/Time:    Ventricular Rate:    PR Interval:    QRS Duration:    QT Interval:    QTC Calculation:   R Axis:      Text Interpretation:            RADIOLOGY: My personal review and interpretation of imaging:    I have personally reviewed all radiology reports. No results found.   PROCEDURES:  Critical Care performed: No      Procedures    IMPRESSION / MDM /  ASSESSMENT AND PLAN / ED COURSE  I reviewed the triage vital signs and the nursing notes.   Patient has shingles.  It is not disseminated and she does not have systemic symptoms.  Well-appearing.  Not a diabetic or immunocompromise.     DIFFERENTIAL DIAGNOSIS (includes but not limited to):   Shingles.  No sign of superimposed cellulitis or abscess.  It is not disseminated.  No urticaria.  Patient's presentation is most consistent with acute complicated illness / injury requiring diagnostic workup.  PLAN: Will start patient on Valtrex.  Will discharge with pain medication for if and when this becomes painful.  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  I have advised her that per up-to-date and CDC guidelines that she should not return to work with immunocompromised and high risk patient populations until all of the vesicular lesions are crusted over.  She verbalized understanding.  I have provided her with a work note for the next week.  Discussed with her if symptoms are worsening that she should follow-up with her PCP.  I feel she is safe for discharge.   MEDICATIONS GIVEN IN ED: Medications  valACYclovir (VALTREX) tablet 1,000 mg (1,000 mg Oral Given 03/12/23 1610)     ED COURSE:  At this time, I do not feel there is any life-threatening condition present. I reviewed all nursing notes, vitals, pertinent previous records.  All lab and urine results, EKGs, imaging ordered have been independently reviewed and interpreted by myself.  I reviewed all available radiology reports from any imaging ordered this visit.  Based on my assessment, I feel the patient is safe to be discharged home without further emergent workup and can continue workup as an outpatient as needed. Discussed all findings, treatment plan as well as usual and customary return precautions.  They verbalize understanding and are comfortable with this plan.  Outpatient follow-up has been provided as  needed.  All questions have been answered.    CONSULTS:  none   OUTSIDE RECORDS REVIEWED: Reviewed internal medicine note in 2018.     FINAL CLINICAL IMPRESSION(S) / ED DIAGNOSES   Final diagnoses:  Herpes zoster without complication     Rx / DC Orders   ED Discharge Orders          Ordered    valACYclovir (VALTREX) 1000 MG tablet  3 times daily        03/12/23 0633    oxyCODONE-acetaminophen (PERCOCET) 5-325 MG tablet  Every 6 hours PRN  03/12/23 0633    ondansetron (ZOFRAN-ODT) 4 MG disintegrating tablet  Every 6 hours PRN        03/12/23 2956             Note:  This document was prepared using Dragon voice recognition software and may include unintentional dictation errors.   Neriah Brott, Layla Maw, DO 03/12/23 [phone]

## 2023-03-12 NOTE — Discharge Instructions (Addendum)
Please take your Valtrex 3 times a day for the next week.  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 or disseminated h?rp?s z??ter, or if they are immunocompromised and develop dermatomal her?es z?st?r. They may return to ??rk when clinically well and after all lesions are dried and crusted (usually about five days after symptoms develop)."  Per CDC - "For localized herpes zoster in an immunocompetent person: Cover lesions and restrict from the care of high-risk patients (e.g., patients who are susceptible to varicella and at increased risk for complications of varicella, including neonates, pregnant women, and immunocompromised persons of any age) until all lesions are dry and scabbed. If lesions cannot be completely covered, exclude them from duty until all lesions are dry and scabbed."   You are being provided a prescription for opiates (also known as narcotics) for pain control.  Opiates can be addictive and should only be used when absolutely necessary for pain control when other alternatives do not work.  We recommend you only use them for the recommended amount of time and only as prescribed.  Please do not take with other sedative medications or alcohol.  Please do not drive, operate machinery, make important decisions while taking opiates.  Please note that these medications can be addictive and have high abuse potential.  Patients can become addicted to narcotics after only taking them for a few days.  Please keep these medications locked away from children, teenagers or any family members with history of substance abuse.  Narcotic pain medicine may also make you constipated.  You may use over-the-counter medications such as MiraLAX, Colace to prevent constipation.  If you  become constipated, you may use over-the-counter enemas as needed.  Itching and nausea are also common side effects of narcotic pain medication.  If you develop uncontrolled vomiting or a rash, please stop these medications and seek medical care.

## 2023-03-18 ENCOUNTER — Encounter: Payer: Self-pay | Admitting: Internal Medicine

## 2023-03-18 ENCOUNTER — Ambulatory Visit (INDEPENDENT_AMBULATORY_CARE_PROVIDER_SITE_OTHER): Payer: 59 | Admitting: Internal Medicine

## 2023-03-18 VITALS — BP 112/64 | HR 76 | Ht 64.0 in | Wt 230.0 lb

## 2023-03-18 DIAGNOSIS — B029 Zoster without complications: Secondary | ICD-10-CM | POA: Diagnosis not present

## 2023-03-18 NOTE — Patient Instructions (Signed)

## 2023-03-18 NOTE — Progress Notes (Signed)
Subjective:    Patient ID: Kelli Chapman, female    DOB: December 04, 1960, 62 y.o.   MRN: [id number]  HPI  Discussed the use of AI scribe software for clinical note transcription with the patient, who gave verbal consent to proceed.  The patient, diagnosed with shingles, presented for an ER follow-up on 03/12/23. She reported a tingling rash on the left flank that had been present for three days prior to the ER visit. The patient was prescribed Valtrex, Zofran, and Percocet. The patient described persistent tingling and stinging sensations in the area of the rash, which also itched. The patient was due to complete the course of valtrex the day after the consultation.  The patient expressed concern about returning to work due to the contagious nature of shingles. 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 The patient reported that the rash had not blistered or crusted over, and was unsure about when it would be safe to return to work.  The patient requested a work note and mentioned the possibility of needing to file for FMLA due to the condition.       Review of Systems   Past Medical History:  Diagnosis Date   Arthritis    right shoulder   Hyperlipidemia    Hypertension     Current Outpatient Medications  Medication Sig Dispense Refill   amLODipine (NORVASC) 10 MG tablet Take 1 tablet (10 mg total) by mouth daily. 90 tablet 0   atorvastatin (LIPITOR) 20 MG tablet Take 1 tablet (20 mg total) by mouth daily. 90 tablet 1   meloxicam (MOBIC) 15 MG tablet Take 1 tablet (15 mg total) by mouth daily. 30 tablet 3   meloxicam (MOBIC) 15 MG tablet Take 1 tablet (15 mg total) by mouth daily. 30 tablet 0   ondansetron (ZOFRAN-ODT) 4 MG disintegrating tablet Take 1 tablet (4 mg total) by mouth every 6 (six) hours as needed for nausea or vomiting. 20 tablet 0   oxyCODONE-acetaminophen (PERCOCET) 5-325 MG tablet  Take 1 tablet by mouth every 6 (six) hours as needed for severe pain (pain score 7-10). 16 tablet 0   valACYclovir (VALTREX) 1000 MG tablet Take 1 tablet (1,000 mg total) by mouth 3 (three) times daily. 21 tablet 0   No current facility-administered medications for this visit.    No Known Allergies  Family History  Problem Relation Age of Onset   Hypertension Mother    Heart disease Mother    Hypertension Father    Diabetes Sister    Hypertension Sister    Kidney disease Paternal Grandfather    Breast cancer Paternal Aunt    Colon cancer Neg Hx     Social History   Socioeconomic History   Marital status: Widowed    Spouse name: Not on file   Number of children: Not on file   Years of education: Not on file   Highest education level: Not on file  Occupational History   Not on file  Tobacco Use   Smoking status: Never   Smokeless tobacco: Never  Vaping Use   Vaping status: Never Used  Substance and Sexual Activity   Alcohol use: Not Currently   Drug use: Never   Sexual activity: Yes    Partners: Male  Other Topics Concern   Not on file  Social History Narrative   Not on file   Social Drivers of Health  Financial Resource Strain: Not on file  Food Insecurity: Not on file  Transportation Needs: Not on file  Physical Activity: Not on file  Stress: Not on file  Social Connections: Not on file  Intimate Partner Violence: Not on file     Constitutional: Denies fever, malaise, fatigue, headache or abrupt weight changes.  Respiratory: Denies difficulty breathing, shortness of breath, cough or sputum production.   Cardiovascular: Denies chest pain, chest tightness, palpitations or swelling in the hands or feet.  Skin: Pt reports rash to left flank. Denies ulcercations.    No other specific complaints in a complete review of systems (except as listed in HPI above).      Objective:   Physical Exam  BP 112/64   Pulse 76   Ht 5\' 4"  (1.626 m)   Wt 230 lb  (104.3 kg)   SpO2 96%   BMI 39.48 kg/m   Wt Readings from Last 3 Encounters:  03/12/23 232 lb (105.2 kg)  08/12/22 236 lb (107 kg)  04/23/22 230 lb (104.3 kg)    General: Appears her stated age, obese, in NAD. Skin: Warm, dry and intact.  Grouped, scabbed rash on erythematous base noted of the left flank. Cardiovascular: Normal rate and rhythm. S1,S2 noted.  No murmur, rubs or gallops noted.  Pulmonary/Chest: Normal effort and positive vesicular breath sounds. No respiratory distress. No wheezes, rales or ronchi noted.   Neurological: Alert and oriented.   BMET    Component Value Date/Time   NA 145 08/12/2022 0851   NA 141 04/13/2014 0654   K 4.1 08/12/2022 0851   K 3.5 04/13/2014 0654   CL 107 08/12/2022 0851   CL 106 04/13/2014 0654   CO2 29 08/12/2022 0851   CO2 29 04/13/2014 0654   GLUCOSE 101 (H) 08/12/2022 0851   GLUCOSE 91 04/13/2014 0654   BUN 14 08/12/2022 0851   BUN 12 04/13/2014 0654   CREATININE 0.80 08/12/2022 0851   CALCIUM 9.4 08/12/2022 0851   CALCIUM 8.7 04/13/2014 0654   GFRNONAA 72 08/29/2020 0930   GFRAA 84 08/29/2020 0930    Lipid Panel     Component Value Date/Time   CHOL 154 08/12/2022 0851   CHOL 178 09/16/2012 0743   TRIG 59 08/12/2022 0851   TRIG 79 09/16/2012 0743   HDL 54 08/12/2022 0851   HDL 41 09/16/2012 0743   CHOLHDL 2.9 08/12/2022 0851   VLDL 17 09/09/2017 0739   VLDL 16 09/16/2012 0743   LDLCALC 86 08/12/2022 0851   LDLCALC 121 (H) 09/16/2012 0743    CBC    Component Value Date/Time   WBC 6.4 08/12/2022 0851   RBC 4.67 08/12/2022 0851   HGB 12.1 08/12/2022 0851   HGB 12.8 05/09/2011 1506   HCT 38.5 08/12/2022 0851   HCT 40.2 05/09/2011 1506   PLT 320 08/12/2022 0851   PLT 289 05/09/2011 1506   MCV 82.4 08/12/2022 0851   MCV 80 05/09/2011 1506   MCH 25.9 (L) 08/12/2022 0851   MCHC 31.4 (L) 08/12/2022 0851   RDW 14.1 08/12/2022 0851   RDW 13.7 05/09/2011 1506   LYMPHSABS 2,703 06/15/2019 0840   LYMPHSABS 2.3  05/09/2011 1506   MONOABS 1.0 (H) 09/09/2017 0739   MONOABS 0.6 05/09/2011 1506   EOSABS 50 06/15/2019 0840   EOSABS 0.2 05/09/2011 1506   BASOSABS 50 06/15/2019 0840   BASOSABS 0.0 05/09/2011 1506    Hgb A1C Lab Results  Component Value Date   HGBA1C 6.0 (H) 08/12/2022  Assessment & Plan:  Assessment and Plan    Herpes Zoster (Shingles) Left flank rash with tingling, stinging, and itching. Currently on Valtrex, Zofran, and Percocet. Rash is in various stages with some areas crusted over. -ER notes reviewed -Complete Valtrex course. -Return to work at the nursing home once rash is completely crusted over. -Avoid close contact with infants, pregnant women, and immunocompromised individuals until rash is completely resolved. -Consider Shingles vaccine 90 days after resolution of current episode.  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 -Return to work at the nursing home once rash is completely crusted over. -Consult with workplace health regarding return to work in mother-baby unit. -Provide work note stating patient is okay to return to work at the nursing home.      RTC in 1 month for annual exam Nicki Reaper, NP

## 2023-03-26 ENCOUNTER — Ambulatory Visit (INDEPENDENT_AMBULATORY_CARE_PROVIDER_SITE_OTHER): Payer: 59 | Admitting: Internal Medicine

## 2023-03-26 ENCOUNTER — Encounter: Payer: Self-pay | Admitting: Internal Medicine

## 2023-03-26 VITALS — BP 118/70 | Ht 64.0 in | Wt 231.0 lb

## 2023-03-26 DIAGNOSIS — B029 Zoster without complications: Secondary | ICD-10-CM | POA: Diagnosis not present

## 2023-03-26 NOTE — Progress Notes (Signed)
Subjective:    Patient ID: Kelli Chapman, female    DOB: 1961/01/29, 62 y.o.   MRN: [id number]  HPI  Discussed the use of AI scribe software for clinical note transcription with the patient, who gave verbal consent to proceed.  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 She has completed the prescribed treatment and is seeking clearance to return to work. The patient reports that the shingles rash has not completely resolved, but has significantly improved. She describes the current state of the rash as 'scabbed over' and dry, with no vesicular lesions. She denies any associated fever throughout the course of the illness. The patient is also interested in receiving the shingles vaccine and inquires about the appropriate timing for administration post-shingles episode.       Review of Systems   Past Medical History:  Diagnosis Date   Arthritis    right shoulder   Hyperlipidemia    Hypertension     Current Outpatient Medications  Medication Sig Dispense Refill   amLODipine (NORVASC) 10 MG tablet Take 1 tablet (10 mg total) by mouth daily. 90 tablet 0   atorvastatin (LIPITOR) 20 MG tablet Take 1 tablet (20 mg total) by mouth daily. 90 tablet 1   No current facility-administered medications for this visit.    No Known Allergies  Family History  Problem Relation Age of Onset   Hypertension Mother    Heart disease Mother    Hypertension Father    Diabetes Sister    Hypertension Sister    Kidney disease Paternal Grandfather    Breast cancer Paternal Aunt    Colon cancer Neg Hx     Social History   Socioeconomic History   Marital status: Widowed    Spouse name: Not on file   Number of children: Not on file   Years of education: Not on file   Highest education level: Not on file  Occupational History   Not on file  Tobacco Use   Smoking status: Never   Smokeless tobacco: Never  Vaping Use   Vaping status:  Never Used  Substance and Sexual Activity   Alcohol use: Not Currently   Drug use: Never   Sexual activity: Yes    Partners: Male  Other Topics Concern   Not on file  Social History Narrative   Not on file   Social Drivers of Health   Financial Resource Strain: Low Risk  (03/24/2023)   Overall Financial Resource Strain (CARDIA)    Difficulty of Paying Living Expenses: Not hard at all  Food Insecurity: No Food Insecurity (03/24/2023)   Hunger Vital Sign    Worried About Running Out of Food in the Last Year: Never true    Ran Out of Food in the Last Year: Never true  Transportation Needs: No Transportation Needs (03/24/2023)   PRAPARE - Administrator, Civil Service (Medical): No    Lack of Transportation (Non-Medical): No  Physical Activity: Insufficiently Active (03/24/2023)   Exercise Vital Sign    Days of Exercise per Week: 1 day    Minutes of Exercise per Session: 50 min  Stress: No Stress Concern Present (03/24/2023)   [employer] of Occupational Health - Occupational Stress Questionnaire    Feeling of Stress : Not at all  Social Connections: Moderately Isolated (03/24/2023)   Social Connection and Isolation Panel [NHANES]    Frequency of Communication with Friends and Family: More  than three times a week    Frequency of Social Gatherings with Friends and Family: More than three times a week    Attends Religious Services: More than 4 times per year    Active Member of [employer] or Organizations: No    Attends Banker Meetings: Not on file    Marital Status: Widowed  Intimate Partner Violence: Not on file     Constitutional: Denies fever, malaise, fatigue, headache or abrupt weight changes.  Respiratory: Denies difficulty breathing, shortness of breath, cough or sputum production.   Cardiovascular: Denies chest pain, chest tightness, palpitations or swelling in the hands or feet.  Skin: Patient reports scabbed rash to left flank.  Denies  ulcerations.    No other specific complaints in a complete review of systems (except as listed in HPI above).      Objective:   Physical Exam  BP 118/70 (BP Location: Left Arm, Patient Position: Sitting, Cuff Size: Large)   Ht 5\' 4"  (1.626 m)   Wt 231 lb (104.8 kg)   BMI 39.65 kg/m   Wt Readings from Last 3 Encounters:  03/26/23 231 lb (104.8 kg)  03/18/23 230 lb (104.3 kg)  03/12/23 232 lb (105.2 kg)    General: Appears her stated age, obese, in NAD. Skin: Grouped, scabbed lesions noted of the left flank.  Cardiovascular: Normal rate and rhythm.  Pulmonary/Chest: Normal effort and positive vesicular breath sounds.  Neurological: Alert and oriented.   BMET    Component Value Date/Time   NA 145 08/12/2022 0851   NA 141 04/13/2014 0654   K 4.1 08/12/2022 0851   K 3.5 04/13/2014 0654   CL 107 08/12/2022 0851   CL 106 04/13/2014 0654   CO2 29 08/12/2022 0851   CO2 29 04/13/2014 0654   GLUCOSE 101 (H) 08/12/2022 0851   GLUCOSE 91 04/13/2014 0654   BUN 14 08/12/2022 0851   BUN 12 04/13/2014 0654   CREATININE 0.80 08/12/2022 0851   CALCIUM 9.4 08/12/2022 0851   CALCIUM 8.7 04/13/2014 0654   GFRNONAA 72 08/29/2020 0930   GFRAA 84 08/29/2020 0930    Lipid Panel     Component Value Date/Time   CHOL 154 08/12/2022 0851   CHOL 178 09/16/2012 0743   TRIG 59 08/12/2022 0851   TRIG 79 09/16/2012 0743   HDL 54 08/12/2022 0851   HDL 41 09/16/2012 0743   CHOLHDL 2.9 08/12/2022 0851   VLDL 17 09/09/2017 0739   VLDL 16 09/16/2012 0743   LDLCALC 86 08/12/2022 0851   LDLCALC 121 (H) 09/16/2012 0743    CBC    Component Value Date/Time   WBC 6.4 08/12/2022 0851   RBC 4.67 08/12/2022 0851   HGB 12.1 08/12/2022 0851   HGB 12.8 05/09/2011 1506   HCT 38.5 08/12/2022 0851   HCT 40.2 05/09/2011 1506   PLT 320 08/12/2022 0851   PLT 289 05/09/2011 1506   MCV 82.4 08/12/2022 0851   MCV 80 05/09/2011 1506   MCH 25.9 (L) 08/12/2022 0851   MCHC 31.4 (L) 08/12/2022 0851    RDW 14.1 08/12/2022 0851   RDW 13.7 05/09/2011 1506   LYMPHSABS 2,703 06/15/2019 0840   LYMPHSABS 2.3 05/09/2011 1506   MONOABS 1.0 (H) 09/09/2017 0739   MONOABS 0.6 05/09/2011 1506   EOSABS 50 06/15/2019 0840   EOSABS 0.2 05/09/2011 1506   BASOSABS 50 06/15/2019 0840   BASOSABS 0.0 05/09/2011 1506    Hgb A1C Lab Results  Component Value Date  HGBA1C 6.0 (H) 08/12/2022            Assessment & Plan:  Assessment and Plan    Herpes Zoster (Shingles) Completed treatment and lesions are scabbed over. No fever or vesicular lesions. 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 -Provide work clearance note indicating that lesions are dry and scabbed over. -Document lesion status with a photograph in the patient's chart.  Shingles Vaccination Patient inquired about timing for shingles vaccination post-herpes zoster infection. -Advise patient to wait 90 days post-infection before receiving the shingles vaccine to prevent potential flare-up.   RTC in 1 month for your annual exam Nicki Reaper, NP

## 2023-03-26 NOTE — Patient Instructions (Signed)

## 2023-03-29 ENCOUNTER — Encounter: Payer: Self-pay | Admitting: Internal Medicine

## 2023-04-06 ENCOUNTER — Telehealth: Payer: Self-pay | Admitting: Internal Medicine

## 2023-04-06 ENCOUNTER — Other Ambulatory Visit: Payer: Self-pay

## 2023-04-06 MED ORDER — GABAPENTIN 100 MG PO CAPS
100.0000 mg | ORAL_CAPSULE | Freq: Three times a day (TID) | ORAL | 0 refills | Status: DC
  Filled 2023-04-06: qty 30, 10d supply, fill #0

## 2023-04-06 NOTE — Telephone Encounter (Addendum)
 Pt states she is still having pain from her shigles.  Pt states regina mentioned calling in gabapentin for her pain.pt would like to proceed with this med if you can please send to the pharmacy.

## 2023-04-28 ENCOUNTER — Encounter: Payer: 59 | Admitting: Internal Medicine

## 2023-04-30 ENCOUNTER — Ambulatory Visit (INDEPENDENT_AMBULATORY_CARE_PROVIDER_SITE_OTHER): Payer: Commercial Managed Care - PPO | Admitting: Internal Medicine

## 2023-04-30 ENCOUNTER — Encounter: Payer: Self-pay | Admitting: Internal Medicine

## 2023-04-30 VITALS — BP 118/74 | Ht 64.0 in | Wt 234.6 lb

## 2023-04-30 DIAGNOSIS — R7303 Prediabetes: Secondary | ICD-10-CM | POA: Diagnosis not present

## 2023-04-30 DIAGNOSIS — E782 Mixed hyperlipidemia: Secondary | ICD-10-CM

## 2023-04-30 DIAGNOSIS — Z6841 Body Mass Index (BMI) 40.0 and over, adult: Secondary | ICD-10-CM | POA: Diagnosis not present

## 2023-04-30 DIAGNOSIS — E66813 Obesity, class 3: Secondary | ICD-10-CM

## 2023-04-30 DIAGNOSIS — Z0001 Encounter for general adult medical examination with abnormal findings: Secondary | ICD-10-CM

## 2023-04-30 NOTE — Assessment & Plan Note (Signed)
Encourage diet and exercise for weight loss

## 2023-04-30 NOTE — Patient Instructions (Signed)
Health Maintenance for Postmenopausal Women Menopause is a normal process in which your ability to get pregnant comes to an end. This process happens slowly over many months or years, usually between the ages of 53 and 48. Menopause is complete when you have missed your menstrual period for 12 months. It is important to talk with your health care provider about some of the most common conditions that affect women after menopause (postmenopausal women). These include heart disease, cancer, and bone loss (osteoporosis). Adopting a healthy lifestyle and getting preventive care can help to promote your health and wellness. The actions you take can also lower your chances of developing some of these common conditions. What are the signs and symptoms of menopause? During menopause, you may have the following symptoms: Hot flashes. These can be moderate or severe. Night sweats. Decrease in sex drive. Mood swings. Headaches. Tiredness (fatigue). Irritability. Memory problems. Problems falling asleep or staying asleep. Talk with your health care provider about treatment options for your symptoms. Do I need hormone replacement therapy? Hormone replacement therapy is effective in treating symptoms that are caused by menopause, such as hot flashes and night sweats. Hormone replacement carries certain risks, especially as you become older. If you are thinking about using estrogen or estrogen with progestin, discuss the benefits and risks with your health care provider. How can I reduce my risk for heart disease and stroke? The risk of heart disease, heart attack, and stroke increases as you age. One of the causes may be a change in the body's hormones during menopause. This can affect how your body uses dietary fats, triglycerides, and cholesterol. Heart attack and stroke are medical emergencies. There are many things that you can do to help prevent heart disease and stroke. Watch your blood pressure High  blood pressure causes heart disease and increases the risk of stroke. This is more likely to develop in people who have high blood pressure readings or are overweight. Have your blood pressure checked: Every 3-5 years if you are 81-82 years of age. Every year if you are 70 years old or older. Eat a healthy diet  Eat a diet that includes plenty of vegetables, fruits, low-fat dairy products, and lean protein. Do not eat a lot of foods that are high in solid fats, added sugars, or sodium. Get regular exercise Get regular exercise. This is one of the most important things you can do for your health. Most adults should: Try to exercise for at least 150 minutes each week. The exercise should increase your heart rate and make you sweat (moderate-intensity exercise). Try to do strengthening exercises at least twice each week. Do these in addition to the moderate-intensity exercise. Spend less time sitting. Even light physical activity can be beneficial. Other tips Work with your health care provider to achieve or maintain a healthy weight. Do not use any products that contain nicotine or tobacco. These products include cigarettes, chewing tobacco, and vaping devices, such as e-cigarettes. If you need help quitting, ask your health care provider. Know your numbers. Ask your health care provider to check your cholesterol and your blood sugar (glucose). Continue to have your blood tested as directed by your health care provider. Do I need screening for cancer? Depending on your health history and family history, you may need to have cancer screenings at different stages of your life. This may include screening for: Breast cancer. Cervical cancer. Lung cancer. Colorectal cancer. What is my risk for osteoporosis? After menopause, you may be  at increased risk for osteoporosis. Osteoporosis is a condition in which bone destruction happens more quickly than new bone creation. To help prevent osteoporosis or  the bone fractures that can happen because of osteoporosis, you may take the following actions: If you are 25-40 years old, get at least 1,000 mg of calcium and at least 600 international units (IU) of vitamin D per day. If you are older than age 7 but younger than age 34, get at least 1,200 mg of calcium and at least 600 international units (IU) of vitamin D per day. If you are older than age 41, get at least 1,200 mg of calcium and at least 800 international units (IU) of vitamin D per day. Smoking and drinking excessive alcohol increase the risk of osteoporosis. Eat foods that are rich in calcium and vitamin D, and do weight-bearing exercises several times each week as directed by your health care provider. How does menopause affect my mental health? Depression may occur at any age, but it is more common as you become older. Common symptoms of depression include: Feeling depressed. Changes in sleep patterns. Changes in appetite or eating patterns. Feeling an overall lack of motivation or enjoyment of activities that you previously enjoyed. Frequent crying spells. Talk with your health care provider if you think that you are experiencing any of these symptoms. General instructions See your health care provider for regular wellness exams and vaccines. This may include: Scheduling regular health, dental, and eye exams. Getting and maintaining your vaccines. These include: Influenza vaccine. Get this vaccine each year before the flu season begins. Pneumonia vaccine. Shingles vaccine. Tetanus, diphtheria, and pertussis (Tdap) booster vaccine. Your health care provider may also recommend other immunizations. Tell your health care provider if you have ever been abused or do not feel safe at home. Summary Menopause is a normal process in which your ability to get pregnant comes to an end. This condition causes hot flashes, night sweats, decreased interest in sex, mood swings, headaches, or lack  of sleep. Treatment for this condition may include hormone replacement therapy. Take actions to keep yourself healthy, including exercising regularly, eating a healthy diet, watching your weight, and checking your blood pressure and blood sugar levels. Get screened for cancer and depression. Make sure that you are up to date with all your vaccines. This information is not intended to replace advice given to you by your health care provider. Make sure you discuss any questions you have with your health care provider. Document Revised: 08/06/2020 Document Reviewed: 08/06/2020 Elsevier Patient Education  2024 [employer].

## 2023-04-30 NOTE — Progress Notes (Signed)
Subjective:    Patient ID: Kelli Chapman, female    DOB: September 13, 1960, 63 y.o.   MRN: [id number]  HPI  Patient presents to clinic today for her annual exam.    Flu: 12/2022 Tetanus: 08/2017 COVID: Pfizer x 2 Shingrix: Never Pap smear: 08/2017 Mammogram: 10/2022 Bone density: 10/2022 Colon screening: 05/2021, Cologuard Vision screening: as needed Dentist: as needed, dentures  Diet: She does eat meat. She consumes fruits and veggies. She does eat fried foods. She drinks mostly soda, water, energy drinks. Exercise: Walking  Review of Systems     Past Medical History:  Diagnosis Date   Arthritis    right shoulder   Hyperlipidemia    Hypertension     Current Outpatient Medications  Medication Sig Dispense Refill   amLODipine (NORVASC) 10 MG tablet Take 1 tablet (10 mg total) by mouth daily. 90 tablet 0   atorvastatin (LIPITOR) 20 MG tablet Take 1 tablet (20 mg total) by mouth daily. 90 tablet 1   gabapentin (NEURONTIN) 100 MG capsule Take 1 capsule (100 mg total) by mouth 3 (three) times daily. 30 capsule 0   No current facility-administered medications for this visit.    No Known Allergies  Family History  Problem Relation Age of Onset   Hypertension Mother    Heart disease Mother    Hypertension Father    Diabetes Sister    Hypertension Sister    Kidney disease Paternal Grandfather    Breast cancer Paternal Aunt    Colon cancer Neg Hx     Social History   Socioeconomic History   Marital status: Widowed    Spouse name: Not on file   Number of children: Not on file   Years of education: Not on file   Highest education level: Not on file  Occupational History   Not on file  Tobacco Use   Smoking status: Never   Smokeless tobacco: Never  Vaping Use   Vaping status: Never Used  Substance and Sexual Activity   Alcohol use: Not Currently   Drug use: Never   Sexual activity: Yes    Partners: Male  Other Topics Concern   Not on file  Social History Narrative    Not on file   Social Drivers of Health   Financial Resource Strain: Low Risk  (03/24/2023)   Overall Financial Resource Strain (CARDIA)    Difficulty of Paying Living Expenses: Not hard at all  Food Insecurity: No Food Insecurity (03/24/2023)   Hunger Vital Sign    Worried About Running Out of Food in the Last Year: Never true    Ran Out of Food in the Last Year: Never true  Transportation Needs: No Transportation Needs (03/24/2023)   PRAPARE - Administrator, Civil Service (Medical): No    Lack of Transportation (Non-Medical): No  Physical Activity: Insufficiently Active (03/24/2023)   Exercise Vital Sign    Days of Exercise per Week: 1 day    Minutes of Exercise per Session: 50 min  Stress: No Stress Concern Present (03/24/2023)   [employer] of Occupational Health - Occupational Stress Questionnaire    Feeling of Stress : Not at all  Social Connections: Moderately Isolated (03/24/2023)   Social Connection and Isolation Panel [NHANES]    Frequency of Communication with Friends and Family: More than three times a week    Frequency of Social Gatherings with Friends and Family: More than three times a week    Attends Religious Services: More  than 4 times per year    Active Member of Clubs or Organizations: No    Attends Banker Meetings: Not on file    Marital Status: Widowed  Intimate Partner Violence: Not on file     Constitutional: Denies fever, malaise, fatigue, headache or abrupt weight changes.  HEENT: Denies eye pain, eye redness, ear pain, ringing in the ears, wax buildup, runny nose, nasal congestion, bloody nose, or sore throat. Respiratory: Denies difficulty breathing, shortness of breath, cough or sputum production.   Cardiovascular: Denies chest pain, chest tightness, palpitations or swelling in the hands or feet.  Gastrointestinal: Denies abdominal pain, bloating, constipation, diarrhea or blood in the stool.  GU: Denies urgency,  frequency, pain with urination, burning sensation, blood in urine, odor or discharge. Musculoskeletal: Denies decrease in range of motion, difficulty with gait, muscle pain or joint pain and swelling.  Skin: Denies redness, rashes, lesions or ulcercations.  Neurological: Denies dizziness, difficulty with memory, difficulty with speech or problems with balance and coordination.  Psych: Denies anxiety, depression, SI/HI.  No other specific complaints in a complete review of systems (except as listed in HPI above).  Objective:   Physical Exam   BP 118/74 (BP Location: Left Arm, Patient Position: Sitting, Cuff Size: Large)   Ht 5\' 4"  (1.626 m)   Wt 234 lb 9.6 oz (106.4 kg)   BMI 40.27 kg/m    Wt Readings from Last 3 Encounters:  03/26/23 231 lb (104.8 kg)  03/18/23 230 lb (104.3 kg)  03/12/23 232 lb (105.2 kg)    General: Appears her stated age, obese in NAD. Skin: Warm, dry and intact. No rashes, lesions or ulcerations noted. HEENT: Head: normal shape and size; Eyes: sclera white, no icterus, conjunctiva pink, PERRLA and EOMs intact;  Neck:  Neck supple, trachea midline. No masses, lumps or thyromegaly present.  Cardiovascular: Normal rate and rhythm. S1,S2 noted.  No murmur, rubs or gallops noted. No JVD or BLE edema. No carotid bruits noted. Pulmonary/Chest: Normal effort and positive vesicular breath sounds. No respiratory distress. No wheezes, rales or ronchi noted.  Abdomen: Normal bowel sounds.  Musculoskeletal: Strength 5/5 BUE/BLE.  No difficulty with gait.  Neurological: Alert and oriented. Cranial nerves II-XII grossly intact. Coordination normal.  Psychiatric: Mood and affect normal. Behavior is normal. Judgment and thought content normal.     BMET    Component Value Date/Time   NA 145 08/12/2022 0851   NA 141 04/13/2014 0654   K 4.1 08/12/2022 0851   K 3.5 04/13/2014 0654   CL 107 08/12/2022 0851   CL 106 04/13/2014 0654   CO2 29 08/12/2022 0851   CO2 29  04/13/2014 0654   GLUCOSE 101 (H) 08/12/2022 0851   GLUCOSE 91 04/13/2014 0654   BUN 14 08/12/2022 0851   BUN 12 04/13/2014 0654   CREATININE 0.80 08/12/2022 0851   CALCIUM 9.4 08/12/2022 0851   CALCIUM 8.7 04/13/2014 0654   GFRNONAA 72 08/29/2020 0930   GFRAA 84 08/29/2020 0930    Lipid Panel     Component Value Date/Time   CHOL 154 08/12/2022 0851   CHOL 178 09/16/2012 0743   TRIG 59 08/12/2022 0851   TRIG 79 09/16/2012 0743   HDL 54 08/12/2022 0851   HDL 41 09/16/2012 0743   CHOLHDL 2.9 08/12/2022 0851   VLDL 17 09/09/2017 0739   VLDL 16 09/16/2012 0743   LDLCALC 86 08/12/2022 0851   LDLCALC 121 (H) 09/16/2012 0743    CBC  Component Value Date/Time   WBC 6.4 08/12/2022 0851   RBC 4.67 08/12/2022 0851   HGB 12.1 08/12/2022 0851   HGB 12.8 05/09/2011 1506   HCT 38.5 08/12/2022 0851   HCT 40.2 05/09/2011 1506   PLT 320 08/12/2022 0851   PLT 289 05/09/2011 1506   MCV 82.4 08/12/2022 0851   MCV 80 05/09/2011 1506   MCH 25.9 (L) 08/12/2022 0851   MCHC 31.4 (L) 08/12/2022 0851   RDW 14.1 08/12/2022 0851   RDW 13.7 05/09/2011 1506   LYMPHSABS 2,703 06/15/2019 0840   LYMPHSABS 2.3 05/09/2011 1506   MONOABS 1.0 (H) 09/09/2017 0739   MONOABS 0.6 05/09/2011 1506   EOSABS 50 06/15/2019 0840   EOSABS 0.2 05/09/2011 1506   BASOSABS 50 06/15/2019 0840   BASOSABS 0.0 05/09/2011 1506    Hgb A1C Lab Results  Component Value Date   HGBA1C 6.0 (H) 08/12/2022           Assessment & Plan:   Preventative Health Maintenance:  Flu shot UTD Tetanus UTD Encouraged her to get her COVID booster Discussed Shingrix vaccine, she will check coverage with her insurance company and schedule a visit if she would like to have this done Pap smear declined Mammogram UTD Bone density UTD Colon screening UTD Encouraged her to consume a balanced diet and exercise regimen Advised her to see an eye doctor and dentist annually We will check CBC, c-Met, lipid and A1c  today  RTC in 6 months, follow-up chronic conditions Nicki Reaper, NP

## 2023-05-01 ENCOUNTER — Encounter: Payer: Self-pay | Admitting: Internal Medicine

## 2023-05-01 LAB — COMPLETE METABOLIC PANEL WITH GFR
AG Ratio: 1.5 (calc) (ref 1.0–2.5)
ALT: 12 U/L (ref 6–29)
AST: 14 U/L (ref 10–35)
Albumin: 4 g/dL (ref 3.6–5.1)
Alkaline phosphatase (APISO): 105 U/L (ref 37–153)
BUN: 13 mg/dL (ref 7–25)
CO2: 28 mmol/L (ref 20–32)
Calcium: 9.2 mg/dL (ref 8.6–10.4)
Chloride: 107 mmol/L (ref 98–110)
Creat: 0.8 mg/dL (ref 0.50–1.05)
Globulin: 2.6 g/dL (ref 1.9–3.7)
Glucose, Bld: 100 mg/dL — ABNORMAL HIGH (ref 65–99)
Potassium: 4.1 mmol/L (ref 3.5–5.3)
Sodium: 144 mmol/L (ref 135–146)
Total Bilirubin: 0.4 mg/dL (ref 0.2–1.2)
Total Protein: 6.6 g/dL (ref 6.1–8.1)
eGFR: 84 mL/min/{1.73_m2} (ref >=60)

## 2023-05-01 LAB — HEMOGLOBIN A1C
Hgb A1c MFr Bld: 5.9 %{Hb} — ABNORMAL HIGH (ref <5.7)
Mean Plasma Glucose: 123 mg/dL
eAG (mmol/L): 6.8 mmol/L

## 2023-05-01 LAB — LIPID PANEL
Cholesterol: 136 mg/dL (ref <200)
HDL: 46 mg/dL — ABNORMAL LOW (ref >=50)
LDL Cholesterol (Calc): 76 mg/dL
Non-HDL Cholesterol (Calc): 90 mg/dL (ref <130)
Total CHOL/HDL Ratio: 3 (calc) (ref <5.0)
Triglycerides: 67 mg/dL (ref <150)

## 2023-05-01 LAB — CBC
HCT: 40.8 % (ref 35.0–45.0)
Hemoglobin: 12.6 g/dL (ref 11.7–15.5)
MCH: 25.9 pg — ABNORMAL LOW (ref 27.0–33.0)
MCHC: 30.9 g/dL — ABNORMAL LOW (ref 32.0–36.0)
MCV: 83.8 fL (ref 80.0–100.0)
MPV: 10.5 fL (ref 7.5–12.5)
Platelets: 336 10*3/uL (ref 140–400)
RBC: 4.87 10*6/uL (ref 3.80–5.10)
RDW: 14.2 % (ref 11.0–15.0)
WBC: 7 10*3/uL (ref 3.8–10.8)

## 2023-05-07 ENCOUNTER — Other Ambulatory Visit: Payer: Self-pay

## 2023-05-07 ENCOUNTER — Other Ambulatory Visit: Payer: Self-pay | Admitting: Internal Medicine

## 2023-05-08 ENCOUNTER — Other Ambulatory Visit: Payer: Self-pay

## 2023-05-11 ENCOUNTER — Other Ambulatory Visit: Payer: Self-pay

## 2023-05-21 ENCOUNTER — Other Ambulatory Visit: Payer: Self-pay | Admitting: Internal Medicine

## 2023-05-21 ENCOUNTER — Other Ambulatory Visit: Payer: Self-pay

## 2023-05-22 ENCOUNTER — Other Ambulatory Visit: Payer: Self-pay

## 2023-05-22 MED FILL — Amlodipine Besylate Tab 10 MG (Base Equivalent): ORAL | 90 days supply | Qty: 90 | Fill #0 | Status: AC

## 2023-05-22 NOTE — Telephone Encounter (Signed)
 Requested Prescriptions  Pending Prescriptions Disp Refills   amLODipine (NORVASC) 10 MG tablet 90 tablet 0    Sig: Take 1 tablet (10 mg total) by mouth daily.     Cardiovascular: Calcium Channel Blockers 2 Passed - 05/22/2023  8:48 AM      Passed - Last BP in normal range    BP Readings from Last 1 Encounters:  04/30/23 118/74         Passed - Last Heart Rate in normal range    Pulse Readings from Last 1 Encounters:  03/18/23 76         Passed - Valid encounter within last 6 months    Recent Outpatient Visits           3 weeks ago Encounter for general adult medical examination with abnormal findings   [place] [facility] [place], [place] W, NP   1 month ago Herpes zoster without complication   [place] [facility] [place], [place] W, NP   2 months ago Herpes zoster without complication   [place] [facility] [place], Salvadore Oxford, NP   9 months ago Prediabetes   [place] [facility] [place], Salvadore Oxford, NP   1 year ago Encounter for general adult medical examination with abnormal findings   [place] [facility] [place], Salvadore Oxford, NP       Future Appointments             In 5 months Baity, Salvadore Oxford, NP [place] [facility], [facility]

## 2023-05-26 ENCOUNTER — Other Ambulatory Visit: Payer: Self-pay | Admitting: Internal Medicine

## 2023-05-26 ENCOUNTER — Other Ambulatory Visit: Payer: Self-pay

## 2023-05-26 DIAGNOSIS — E7800 Pure hypercholesterolemia, unspecified: Secondary | ICD-10-CM

## 2023-05-27 ENCOUNTER — Other Ambulatory Visit: Payer: Self-pay

## 2023-05-27 MED FILL — Atorvastatin Calcium Tab 20 MG (Base Equivalent): ORAL | 90 days supply | Qty: 90 | Fill #0 | Status: AC

## 2023-05-27 NOTE — Telephone Encounter (Signed)
 Requested Prescriptions  Pending Prescriptions Disp Refills   atorvastatin (LIPITOR) 20 MG tablet 90 tablet 1    Sig: Take 1 tablet (20 mg total) by mouth daily.     Cardiovascular:  Antilipid - Statins Failed - 05/27/2023 11:39 AM      Failed - Lipid Panel in normal range within the last 12 months    Cholesterol  Date Value Ref Range Status  04/30/2023 136 <200 mg/dL Final  16/12/9602 540 0 - 200 mg/dL Final   Ldl Cholesterol, Calc  Date Value Ref Range Status  09/16/2012 121 (H) 0 - 100 mg/dL Final   LDL Cholesterol (Calc)  Date Value Ref Range Status  04/30/2023 76 mg/dL (calc) Final    Comment:    Reference range: <100 . Desirable range <100 mg/dL for primary prevention;   <70 mg/dL for patients with CHD or diabetic patients  with > or = 2 CHD risk factors. Marland Kitchen LDL-C is now calculated using the Martin-Hopkins  calculation, which is a validated novel method providing  better accuracy than the Friedewald equation in the  estimation of LDL-C.  Horald Pollen et al. Lenox Ahr. 9811;914(78): 2061-2068  (http://education.[employer].com/faq/FAQ164)    HDL Cholesterol  Date Value Ref Range Status  09/16/2012 41 40 - 60 mg/dL Final   HDL  Date Value Ref Range Status  04/30/2023 46 (L) > OR = 50 mg/dL Final   Triglycerides  Date Value Ref Range Status  04/30/2023 67 <150 mg/dL Final  29/56/2130 79 0 - 200 mg/dL Final         Passed - Patient is not pregnant      Passed - Valid encounter within last 12 months    Recent Outpatient Visits           3 weeks ago Encounter for general adult medical examination with abnormal findings   [facility] [place], [place] W, NP   2 months ago Herpes zoster without complication   [place] [facility] [place], [place] W, NP   2 months ago Herpes zoster without complication   [place] [facility] [place], Salvadore Oxford, NP   9 months ago Prediabetes   [place] [facility] [place], Salvadore Oxford, NP   1 year ago Encounter for general adult medical examination with abnormal findings   [place] [facility] [place], Salvadore Oxford, NP       Future Appointments             In 5 months Baity, Salvadore Oxford, NP [place] [facility], [facility]

## 2023-08-26 MED FILL — Atorvastatin Calcium Tab 20 MG (Base Equivalent): ORAL | 90 days supply | Qty: 90 | Fill #1 | Status: AC

## 2023-09-24 ENCOUNTER — Other Ambulatory Visit: Payer: Self-pay

## 2023-09-24 ENCOUNTER — Other Ambulatory Visit: Payer: Self-pay | Admitting: Internal Medicine

## 2023-09-25 ENCOUNTER — Other Ambulatory Visit: Payer: Self-pay

## 2023-09-25 MED FILL — Amlodipine Besylate Tab 10 MG (Base Equivalent): ORAL | 90 days supply | Qty: 90 | Fill #0 | Status: AC

## 2023-09-25 NOTE — Telephone Encounter (Signed)
 Requested Prescriptions  Pending Prescriptions Disp Refills   amLODipine  (NORVASC ) 10 MG tablet 90 tablet 0    Sig: Take 1 tablet (10 mg total) by mouth daily.     Cardiovascular: Calcium  Channel Blockers 2 Failed - 09/25/2023  2:26 PM      Failed - Valid encounter within last 6 months    Recent Outpatient Visits   None     Future Appointments             In 1 month Baity, Angeline ORN, NP [place] [facility]            Passed - Last BP in normal range    BP Readings from Last 1 Encounters:  04/30/23 118/74         Passed - Last Heart Rate in normal range    Pulse Readings from Last 1 Encounters:  03/18/23 76

## 2023-09-28 ENCOUNTER — Other Ambulatory Visit: Payer: Self-pay

## 2023-11-03 ENCOUNTER — Ambulatory Visit: Payer: Self-pay | Admitting: Internal Medicine

## 2023-11-03 ENCOUNTER — Encounter: Payer: Self-pay | Admitting: Internal Medicine

## 2023-11-03 ENCOUNTER — Other Ambulatory Visit: Payer: Self-pay

## 2023-11-03 VITALS — BP 120/80 | HR 61 | Ht 64.0 in | Wt 238.4 lb

## 2023-11-03 DIAGNOSIS — M722 Plantar fascial fibromatosis: Secondary | ICD-10-CM | POA: Insufficient documentation

## 2023-11-03 DIAGNOSIS — Z1231 Encounter for screening mammogram for malignant neoplasm of breast: Secondary | ICD-10-CM | POA: Diagnosis not present

## 2023-11-03 DIAGNOSIS — Z6841 Body Mass Index (BMI) 40.0 and over, adult: Secondary | ICD-10-CM

## 2023-11-03 DIAGNOSIS — I10 Essential (primary) hypertension: Secondary | ICD-10-CM

## 2023-11-03 DIAGNOSIS — M8580 Other specified disorders of bone density and structure, unspecified site: Secondary | ICD-10-CM | POA: Insufficient documentation

## 2023-11-03 DIAGNOSIS — E7800 Pure hypercholesterolemia, unspecified: Secondary | ICD-10-CM | POA: Diagnosis not present

## 2023-11-03 DIAGNOSIS — E66813 Obesity, class 3: Secondary | ICD-10-CM

## 2023-11-03 DIAGNOSIS — R7303 Prediabetes: Secondary | ICD-10-CM | POA: Diagnosis not present

## 2023-11-03 DIAGNOSIS — M85852 Other specified disorders of bone density and structure, left thigh: Secondary | ICD-10-CM

## 2023-11-03 MED ORDER — MELOXICAM 15 MG PO TABS
15.0000 mg | ORAL_TABLET | Freq: Every day | ORAL | 1 refills | Status: AC
Start: 2023-11-03 — End: ?
  Filled 2023-11-03: qty 90, 90d supply, fill #0

## 2023-11-03 MED ORDER — AMLODIPINE BESYLATE 10 MG PO TABS
10.0000 mg | ORAL_TABLET | Freq: Every day | ORAL | 0 refills | Status: DC
  Filled 2023-11-03 – 2023-12-23 (×3): qty 90, 90d supply, fill #0
  Filled ????-??-??: fill #0

## 2023-11-03 MED ORDER — ATORVASTATIN CALCIUM 20 MG PO TABS
20.0000 mg | ORAL_TABLET | Freq: Every day | ORAL | 1 refills | Status: DC
  Filled 2023-11-03 – 2023-11-06 (×3): qty 90, 90d supply, fill #0
  Filled 2024-03-07: qty 90, 90d supply, fill #1

## 2023-11-03 NOTE — Assessment & Plan Note (Signed)
 C-Met and lipid profile today Encouraged her to consume a low-fat diet Continue atorvastatin  20 mg daily

## 2023-11-03 NOTE — Assessment & Plan Note (Signed)
 Encourage diet and exercise for weight loss

## 2023-11-03 NOTE — Patient Instructions (Signed)
 Osteopenia  Osteopenia is a loss of thickness (density) inside the bones. Another name for osteopenia is low bone mass. Mild osteopenia is a normal part of aging. It is not a disease, and it does not cause symptoms. However, if you have osteopenia and continue to lose bone mass, you could develop a condition that causes the bones to become thin and break more easily (osteoporosis). Osteoporosis can cause you to lose some height, have back pain, and have a stooped posture. Although osteopenia is not a disease, making changes to your lifestyle and diet can help to prevent osteopenia from developing into osteoporosis. What are the causes? Osteopenia is caused by loss of calcium in the bones. Bones are constantly changing. Old bone cells are continually being replaced with new bone cells. This process builds new bone. The mineral calcium is needed to build new bone and maintain bone density. Bone density is usually highest around age 63. After that, most people's bodies cannot replace all the bone they have lost with new bone. What increases the risk? You are more likely to develop this condition if: You are older than age 73. You are a woman who went through menopause early. You have a long illness that keeps you in bed. You do not get enough exercise. You lack certain nutrients (malnutrition). You have an overactive thyroid gland (hyperthyroidism). You use products that contain nicotine or tobacco, such as cigarettes, e-cigarettes and chewing tobacco, or you drink a lot of alcohol. You are taking medicines that weaken the bones, such as steroids. What are the signs or symptoms? This condition does not cause any symptoms. You may have a slightly higher risk for bone breaks (fractures), so getting fractures more easily than normal may be an indication of osteopenia. How is this diagnosed? This condition may be diagnosed based on an X-ray exam that measures bone density (dual-energy X-ray  absorptiometry, or DEXA). This test can measure bone density in your hips, spine, and wrists. Osteopenia has no symptoms, so this condition is usually diagnosed after a routine bone density screening test is done for osteoporosis. This routine screening is usually done for: Women who are age 68 or older. Men who are age 26 or older. If you have risk factors for osteopenia, you may have the screening test at an earlier age. How is this treated? Making dietary and lifestyle changes can lower your risk for osteoporosis. If you have severe osteopenia that is close to becoming osteoporosis, this condition can be treated with medicines and dietary supplements such as calcium and vitamin D . These supplements help to rebuild bone density. Follow these instructions at home: Eating and drinking Eat a diet that is high in calcium and vitamin D . Calcium is found in dairy products, beans, salmon, and leafy green vegetables like spinach and broccoli. Look for foods that have vitamin D  and calcium added to them (fortified foods), such as orange juice, cereal, and bread.  Lifestyle Do 30 minutes or more of a weight-bearing exercise every day, such as walking, jogging, or playing a sport. These types of exercises strengthen the bones. Do not use any products that contain nicotine or tobacco, such as cigarettes, e-cigarettes, and chewing tobacco. If you need help quitting, ask your health care provider. Do not drink alcohol if: Your health care provider tells you not to drink. You are pregnant, may be pregnant, or are planning to become pregnant. If you drink alcohol: Limit how much you use to: 0-1 drink a day for women. 0-2  drinks a day for men. Be aware of how much alcohol is in your drink. In the [place] one drink equals one 12 oz bottle of beer (355 mL), one 5 oz glass of wine (148 mL), or one 1 oz glass of hard liquor (44 mL). General instructions Take over-the-counter and prescription medicines only as  told by your health care provider. These include vitamins and supplements. Take precautions at home to lower your risk of falling, such as: Keeping rooms well-lit and free of clutter, such as cords. Installing safety rails on stairs. Using rubber mats in the bathroom or other areas that are often wet or slippery. Keep all follow-up visits. This is important. Contact a health care provider if: You have not had a bone density screening for osteoporosis and you are: A woman who is age 25 or older. A man who is age 50 or older. You are a postmenopausal woman who has not had a bone density screening for osteoporosis. You are older than age 52 and you want to know if you should have bone density screening for osteoporosis. Summary Osteopenia is a loss of thickness (density) inside the bones. Another name for osteopenia is low bone mass. Osteopenia is not a disease, but it may increase your risk for a condition that causes the bones to become thin and break more easily (osteoporosis). You may be at risk for osteopenia if you are older than age 7 or if you are a woman who went through early menopause. Osteopenia does not cause any symptoms, but it can be diagnosed with a bone density screening test. Dietary and lifestyle changes are the first treatment for osteopenia. These may lower your risk for osteoporosis. This information is not intended to replace advice given to you by your health care provider. Make sure you discuss any questions you have with your health care provider. Document Revised: 12/02/2022 Document Reviewed: 12/02/2022 Elsevier Patient Education  2025 [employer].

## 2023-11-03 NOTE — Progress Notes (Signed)
 Subjective:    Patient ID: Kelli Chapman, female    DOB: [birth year], 63 y.o.   MRN: [id number]  HPI  Patient presents to clinic today for follow-up of chronic conditions.  HTN: Her BP today 120/80. She is taking amlodipine  as prescribed.  She reports some swelling in her legs after working long shifts but this improves after elevation.  She denies shortness of breath.  ECG from 01/2019 reviewed.  Plantar fasciitis: Mainly in her left heel.  She is no longer taking meloxicam  but would like to get restarted on this. She no longer follows with podiatry.   HLD: Her last LDL was 76, triglycerides 67, 04/2023.  She denies myalgias on atorvastatin . She tries to consume a low fat diet.  Prediabetes: Her last A1c was 5.9%, 04/2023.  She is not taking any oral diabetic medication at this time.  She does not check her sugars.  Osteopenia: She is not taking calcium  and vitamin D OTC.  She does get some weightbearing exercise.  Bone density from 10/2022 reviewed.  Review of Systems     Past Medical History:  Diagnosis Date   Arthritis    right shoulder   Hyperlipidemia    Hypertension     Current Outpatient Medications  Medication Sig Dispense Refill   amLODipine  (NORVASC ) 10 MG tablet Take 1 tablet (10 mg total) by mouth daily. 90 tablet 0   atorvastatin  (LIPITOR) 20 MG tablet Take 1 tablet (20 mg total) by mouth daily. 90 tablet 1   No current facility-administered medications for this visit.    No Known Allergies  Family History  Problem Relation Age of Onset   Hypertension Mother    Heart disease Mother    Hypertension Father    Diabetes Sister    Hypertension Sister    Kidney disease Paternal Grandfather    Breast cancer Paternal Aunt    Colon cancer Neg Hx     Social History   Socioeconomic History   Marital status: Widowed    Spouse name: Not on file   Number of children: Not on file   Years of education: Not on file   Highest education level: 11th grade  Occupational  History   Not on file  Tobacco Use   Smoking status: Never   Smokeless tobacco: Never  Vaping Use   Vaping status: Never Used  Substance and Sexual Activity   Alcohol use: Not Currently   Drug use: Never   Sexual activity: Yes    Partners: Male  Other Topics Concern   Not on file  Social History Narrative   Not on file   Social Drivers of Health   Financial Resource Strain: Low Risk  (11/02/2023)   Overall Financial Resource Strain (CARDIA)    Difficulty of Paying Living Expenses: Not very hard  Food Insecurity: No Food Insecurity (11/02/2023)   Hunger Vital Sign    Worried About Running Out of Food in the Last Year: Never true    Ran Out of Food in the Last Year: Never true  Transportation Needs: No Transportation Needs (11/02/2023)   PRAPARE - Administrator, Civil Service (Medical): No    Lack of Transportation (Non-Medical): No  Physical Activity: Sufficiently Active (11/02/2023)   Exercise Vital Sign    Days of Exercise per Week: 7 days    Minutes of Exercise per Session: 100 min  Stress: No Stress Concern Present (11/02/2023)   [employer] of Occupational Health - Occupational Stress Questionnaire  Feeling of Stress: Not at all  Social Connections: Moderately Isolated (11/02/2023)   Social Connection and Isolation Panel    Frequency of Communication with Friends and Family: More than three times a week    Frequency of Social Gatherings with Friends and Family: More than three times a week    Attends Religious Services: More than 4 times per year    Active Member of [employer] or Organizations: No    Attends Banker Meetings: Not on file    Marital Status: Widowed  Intimate Partner Violence: Not on file     Constitutional: Denies fever, malaise, fatigue, headache or abrupt weight changes.  HEENT: Denies eye pain, eye redness, ear pain, ringing in the ears, wax buildup, runny nose, nasal congestion, bloody nose, or sore throat. Respiratory: Denies  difficulty breathing, shortness of breath, cough or sputum production.   Cardiovascular: Pt reports intermittent swelling in legs. Denies chest pain, chest tightness, palpitations or swelling in the hands or feet.  Gastrointestinal: Denies abdominal pain, bloating, constipation, diarrhea or blood in the stool.  GU: Denies urgency, frequency, pain with urination, burning sensation, blood in urine, odor or discharge. Musculoskeletal: Patient reports left heel pain.  Denies decrease in range of motion, difficulty with gait, muscle pain or joint swelling.  Skin: Denies redness, rashes, lesions or ulcercations.  Neurological: Denies dizziness, difficulty with memory, difficulty with speech or problems with balance and coordination.  Psych: Denies anxiety, depression, SI/HI.  No other specific complaints in a complete review of systems (except as listed in HPI above).  Objective:   Physical Exam BP 120/80 (BP Location: Left Arm, Patient Position: Sitting, Cuff Size: Normal)   Pulse 61   Ht 5' 4 (1.626 m)   Wt 238 lb 6 oz (108.1 kg)   SpO2 99%   BMI 40.92 kg/m    Wt Readings from Last 3 Encounters:  04/30/23 234 lb 9.6 oz (106.4 kg)  03/26/23 231 lb (104.8 kg)  03/18/23 230 lb (104.3 kg)    General: Appears her stated age, obese in NAD. HEENT: Head: normal shape and size; Eyes: sclera white, no icterus, conjunctiva pink, PERRLA and EOMs intact;  Cardiovascular: Normal rate and rhythm. S1,S2 noted.  No murmur, rubs or gallops noted. No JVD or BLE edema. No carotid bruits noted. Pulmonary/Chest: Normal effort and positive vesicular breath sounds. No respiratory distress. No wheezes, rales or ronchi noted.  Musculoskeletal:  No difficulty with gait.  Neurological: Alert and oriented.  Coordination normal.     BMET    Component Value Date/Time   NA 144 04/30/2023 0816   NA 141 04/13/2014 0654   K 4.1 04/30/2023 0816   K 3.5 04/13/2014 0654   CL 107 04/30/2023 0816   CL 106  04/13/2014 0654   CO2 28 04/30/2023 0816   CO2 29 04/13/2014 0654   GLUCOSE 100 (H) 04/30/2023 0816   GLUCOSE 91 04/13/2014 0654   BUN 13 04/30/2023 0816   BUN 12 04/13/2014 0654   CREATININE 0.80 04/30/2023 0816   CALCIUM  9.2 04/30/2023 0816   CALCIUM  8.7 04/13/2014 0654   GFRNONAA 72 08/29/2020 0930   GFRAA 84 08/29/2020 0930    Lipid Panel     Component Value Date/Time   CHOL 136 04/30/2023 0816   CHOL 178 09/16/2012 0743   TRIG 67 04/30/2023 0816   TRIG 79 09/16/2012 0743   HDL 46 (L) 04/30/2023 0816   HDL 41 09/16/2012 0743   CHOLHDL 3.0 04/30/2023 0816   VLDL  17 09/09/2017 0739   VLDL 16 09/16/2012 0743   LDLCALC 76 04/30/2023 0816   LDLCALC 121 (H) 09/16/2012 0743    CBC    Component Value Date/Time   WBC 7.0 04/30/2023 0816   RBC 4.87 04/30/2023 0816   HGB 12.6 04/30/2023 0816   HGB 12.8 05/09/2011 1506   HCT 40.8 04/30/2023 0816   HCT 40.2 05/09/2011 1506   PLT 336 04/30/2023 0816   PLT 289 05/09/2011 1506   MCV 83.8 04/30/2023 0816   MCV 80 05/09/2011 1506   MCH 25.9 (L) 04/30/2023 0816   MCHC 30.9 (L) 04/30/2023 0816   RDW 14.2 04/30/2023 0816   RDW 13.7 05/09/2011 1506   LYMPHSABS 2,703 06/15/2019 0840   LYMPHSABS 2.3 05/09/2011 1506   MONOABS 1.0 (H) 09/09/2017 0739   MONOABS 0.6 05/09/2011 1506   EOSABS 50 06/15/2019 0840   EOSABS 0.2 05/09/2011 1506   BASOSABS 50 06/15/2019 0840   BASOSABS 0.0 05/09/2011 1506    Hgb A1C Lab Results  Component Value Date   HGBA1C 5.9 (H) 04/30/2023           Assessment & Plan:    RTC in 6 months for your annual exam  Angeline Laura, NP

## 2023-11-03 NOTE — Assessment & Plan Note (Signed)
 Recommend she start 1000 to 1200 mg of calcium  daily Recommend she start 800 to 1000 IU vitamin D3 daily Encouraged weightbearing exercise

## 2023-11-03 NOTE — Assessment & Plan Note (Signed)
A1c today Encouraged carb diet and exercise for weight loss 

## 2023-11-03 NOTE — Assessment & Plan Note (Signed)
 Encouraged her to wear a splint at night Encouraged her to roll her foot before tennis ball or iced water bottle first thing in the morning Rx for meloxicam  15 mg daily as needed, avoid use with other NSAIDs OTC

## 2023-11-03 NOTE — Assessment & Plan Note (Signed)
 Controlled on amlodipine  10 mg daily Reinforced DASH diet and exercise for weight loss C-Met today

## 2023-11-04 ENCOUNTER — Ambulatory Visit: Payer: Self-pay | Admitting: Internal Medicine

## 2023-11-04 ENCOUNTER — Encounter: Payer: Self-pay | Admitting: Internal Medicine

## 2023-11-04 LAB — LIPID PANEL
Cholesterol: 136 mg/dL (ref <200)
HDL: 51 mg/dL (ref >=50)
LDL Cholesterol (Calc): 71 mg/dL
Non-HDL Cholesterol (Calc): 85 mg/dL (ref <130)
Total CHOL/HDL Ratio: 2.7 (calc) (ref <5.0)
Triglycerides: 61 mg/dL (ref <150)

## 2023-11-04 LAB — CBC
HCT: 40.8 % (ref 35.0–45.0)
Hemoglobin: 12.3 g/dL (ref 11.7–15.5)
MCH: 25.4 pg — ABNORMAL LOW (ref 27.0–33.0)
MCHC: 30.1 g/dL — ABNORMAL LOW (ref 32.0–36.0)
MCV: 84.3 fL (ref 80.0–100.0)
MPV: 10.1 fL (ref 7.5–12.5)
Platelets: 323 Thousand/uL (ref 140–400)
RBC: 4.84 Million/uL (ref 3.80–5.10)
RDW: 14 % (ref 11.0–15.0)
WBC: 7.1 Thousand/uL (ref 3.8–10.8)

## 2023-11-04 LAB — COMPREHENSIVE METABOLIC PANEL WITH GFR
AG Ratio: 1.5 (calc) (ref 1.0–2.5)
ALT: 13 U/L (ref 6–29)
AST: 16 U/L (ref 10–35)
Albumin: 4 g/dL (ref 3.6–5.1)
Alkaline phosphatase (APISO): 100 U/L (ref 37–153)
BUN: 14 mg/dL (ref 7–25)
CO2: 28 mmol/L (ref 20–32)
Calcium: 9.4 mg/dL (ref 8.6–10.4)
Chloride: 104 mmol/L (ref 98–110)
Creat: 0.74 mg/dL (ref 0.50–1.05)
Globulin: 2.6 g/dL (ref 1.9–3.7)
Glucose, Bld: 90 mg/dL (ref 65–99)
Potassium: 4.2 mmol/L (ref 3.5–5.3)
Sodium: 141 mmol/L (ref 135–146)
Total Bilirubin: 0.4 mg/dL (ref 0.2–1.2)
Total Protein: 6.6 g/dL (ref 6.1–8.1)
eGFR: 92 mL/min/1.73m2 (ref >=60)

## 2023-11-04 LAB — HEMOGLOBIN A1C
Hgb A1c MFr Bld: 6 % — ABNORMAL HIGH (ref <5.7)
Mean Plasma Glucose: 126 mg/dL
eAG (mmol/L): 7 mmol/L

## 2023-11-06 ENCOUNTER — Other Ambulatory Visit: Payer: Self-pay

## 2023-12-22 ENCOUNTER — Ambulatory Visit
Admission: RE | Admit: 2023-12-22 | Discharge: 2023-12-22 | Disposition: A | Discharge status: Home / Self Care | Source: Ambulatory Visit | Attending: Internal Medicine | Admitting: Internal Medicine

## 2023-12-22 DIAGNOSIS — Z1231 Encounter for screening mammogram for malignant neoplasm of breast: Secondary | ICD-10-CM | POA: Insufficient documentation

## 2023-12-23 ENCOUNTER — Other Ambulatory Visit: Payer: Self-pay

## 2024-03-08 ENCOUNTER — Other Ambulatory Visit: Payer: Self-pay

## 2024-03-08 ENCOUNTER — Ambulatory Visit: Admitting: Internal Medicine

## 2024-03-08 ENCOUNTER — Encounter: Payer: Self-pay | Admitting: Internal Medicine

## 2024-03-08 VITALS — BP 128/84 | Ht 64.0 in | Wt 243.8 lb

## 2024-03-08 DIAGNOSIS — R3129 Other microscopic hematuria: Secondary | ICD-10-CM

## 2024-03-08 DIAGNOSIS — R10A1 Flank pain, right side: Secondary | ICD-10-CM | POA: Diagnosis not present

## 2024-03-08 LAB — POCT URINE DIPSTICK
Bilirubin, UA: NEGATIVE
Glucose, UA: NEGATIVE mg/dL
Ketones, POC UA: NEGATIVE mg/dL
Leukocytes, UA: NEGATIVE
Nitrite, UA: NEGATIVE
Spec Grav, UA: 1.015 (ref 1.010–1.025)
Urobilinogen, UA: 0.2 U/dL
pH, UA: 6.5 (ref 5.0–8.0)

## 2024-03-08 MED ORDER — METHOCARBAMOL 500 MG PO TABS
500.0000 mg | ORAL_TABLET | Freq: Three times a day (TID) | ORAL | 0 refills | Status: AC | PRN
  Filled 2024-03-08: qty 30, 10d supply, fill #0

## 2024-03-08 MED ORDER — TAMSULOSIN HCL 0.4 MG PO CAPS
0.4000 mg | ORAL_CAPSULE | Freq: Every day | ORAL | 0 refills | Status: AC
  Filled 2024-03-08: qty 14, 14d supply, fill #0

## 2024-03-08 NOTE — Patient Instructions (Signed)
 Kidney Stones Kidney stones are rock-like masses that form inside of the kidneys. Kidneys are organs that make pee (urine). A kidney stone may move into other parts of the urinary tract, including: The tubes that connect the kidneys to the bladder (ureters). The bladder. The tube that carries urine out of the body (urethra). Kidney stones can cause very bad pain and can block the flow of pee. The stone usually leaves your body through your pee. A doctor may need to take out the stone. What are the causes? Kidney stones may be caused by: Too much calcium in the body. This may be caused by too much parathyroid hormone in the blood. Uric acid crystals in the bladder. The body makes uric acid when you eat certain foods. Narrowing of one or both of the ureters. A kidney blockage that you were born with. Past surgery on the kidney or the ureters. What increases the risk? You are more likely to develop this condition if: You have had a kidney stone in the past. Other people in your family have had kidney stones. You do not drink enough water. You eat a diet that is high in protein, salt (sodium), or sugar. You are very overweight (obese). What are the signs or symptoms? Symptoms of a kidney stone may include: Pain in the side of the belly, right below the ribs. Pain usually spreads to the groin. Needing to pee often or right away. Pain when peeing. Blood in your pee. Feeling like you may vomit (nauseous). Vomiting. Fever and chills. How is this treated? Treatment depends on the size, location, and makeup of the kidney stones. The stones will often pass out of the body when you pee. You may need to: Drink more fluid to help pass the stone. In some cases, you may be given fluids through an IV tube at the hospital. Take medicine for pain. Change your diet to help keep kidney stones from coming back. Sometimes, you may need: A procedure to break up kidney stones using a beam of light (laser)  or shock waves. Surgery to remove the kidney stones. Follow these instructions at home: Medicines Take over-the-counter and prescription medicines only as told by your doctor. Ask your doctor if the medicine prescribed to you requires you to avoid driving or using machinery. Eating and drinking Drink enough fluid to keep your pee pale yellow. You may be told to drink at least 8-10 glasses of water each day. This will help you pass the stone. If told by your doctor, change your diet. You may be told to: Limit how much salt you eat. Eat more fruits and vegetables. Limit how much meat, poultry, fish, and eggs you eat. Follow instructions from your doctor about what you may eat and drink. General instructions Collect pee samples as told by your doctor. You may need to collect a pee sample: 24 hours after a stone comes out. 8-12 weeks after a stone comes out, and every 6-12 months after that. Strain your pee every time you pee. Use the strainer that your doctor recommends. Do not throw out the stone. Keep it so that it can be tested by your doctor. Keep all follow-up visits. You may need X-rays and ultrasounds to make sure the stone has come out. How is this prevented? To prevent another kidney stone: Drink enough fluid to keep your pee pale yellow. This is the best way to prevent kidney stones. Eat healthy foods. Avoid certain foods as told by your doctor. You  may be told to eat less protein. Stay at a healthy weight. Where to find more information National Kidney Foundation (NKF): kidney.org [facility]): urologyhealth.org Contact a doctor if: You have pain that gets worse or does not get better with medicine. Get help right away if: You have a fever or chills. You get very bad pain. You get new pain in your belly. You faint. You cannot pee. This information is not intended to replace advice given to you by your health care provider. Make sure you discuss any  questions you have with your health care provider. Document Revised: 11/08/2021 Document Reviewed: 11/08/2021 Elsevier Patient Education  2024 [employer].

## 2024-03-08 NOTE — Progress Notes (Signed)
 Subjective:    Patient ID: Kelli Chapman, female    DOB: 20-Feb-1961, 63 y.o.   MRN: [id number]  HPI  Discussed the use of AI scribe software for clinical note transcription with the patient, who gave verbal consent to proceed.  Kelli Chapman is a 63 year old female who presents with right flank pain.  She has been experiencing right flank pain for the past three to four days. The pain is intermittent and exacerbated by certain movements, such as stretching or applying pressure, particularly when getting into bed. It is not significantly affected by turning over in bed but varies with different sitting positions.  No gastrointestinal symptoms such as nausea, vomiting, bloating, constipation, or diarrhea. No urinary symptoms like burning, pain, or visible blood in the urine, although she mentions frequent urination as her baseline. No history of kidney stones and no rash in the area of pain.  She has been taking over-the-counter ibuprofen , which provides some relief, but the pain returns. She mentions that drinking cranberry juice has helped in the past when she thought soft drinks were causing issues, but it has not helped this time. On a scale of one to ten, she rates her current pain as a two or three.       Review of Systems     Past Medical History:  Diagnosis Date   Arthritis    right shoulder   Hyperlipidemia    Hypertension     Current Outpatient Medications  Medication Sig Dispense Refill   amLODipine  (NORVASC ) 10 MG tablet Take 1 tablet (10 mg total) by mouth daily. 90 tablet 0   atorvastatin  (LIPITOR) 20 MG tablet Take 1 tablet (20 mg total) by mouth daily. 90 tablet 1   meloxicam  (MOBIC ) 15 MG tablet Take 1 tablet (15 mg total) by mouth daily. 90 tablet 1   No current facility-administered medications for this visit.    No Known Allergies  Family History  Problem Relation Age of Onset   Hypertension Mother    Heart disease Mother    Hypertension Father    Diabetes  Sister    Hypertension Sister    Kidney disease Paternal Grandfather    Breast cancer Paternal Aunt    Colon cancer Neg Hx     Social History   Socioeconomic History   Marital status: Widowed    Spouse name: Not on file   Number of children: Not on file   Years of education: Not on file   Highest education level: 11th grade  Occupational History   Not on file  Tobacco Use   Smoking status: Never   Smokeless tobacco: Never  Vaping Use   Vaping status: Never Used  Substance and Sexual Activity   Alcohol use: Not Currently   Drug use: Never   Sexual activity: Yes    Partners: Male  Other Topics Concern   Not on file  Social History Narrative   Not on file   Social Drivers of Health   Financial Resource Strain: Low Risk  (11/02/2023)   Overall Financial Resource Strain (CARDIA)    Difficulty of Paying Living Expenses: Not very hard  Food Insecurity: No Food Insecurity (11/02/2023)   Hunger Vital Sign    Worried About Running Out of Food in the Last Year: Never true    Ran Out of Food in the Last Year: Never true  Transportation Needs: No Transportation Needs (11/02/2023)   PRAPARE - Transportation    Lack of Transportation (  Medical): No    Lack of Transportation (Non-Medical): No  Physical Activity: Sufficiently Active (11/02/2023)   Exercise Vital Sign    Days of Exercise per Week: 7 days    Minutes of Exercise per Session: 100 min  Stress: No Stress Concern Present (11/02/2023)   [employer] of Occupational Health - Occupational Stress Questionnaire    Feeling of Stress: Not at all  Social Connections: Moderately Isolated (11/02/2023)   Social Connection and Isolation Panel    Frequency of Communication with Friends and Family: More than three times a week    Frequency of Social Gatherings with Friends and Family: More than three times a week    Attends Religious Services: More than 4 times per year    Active Member of [employer] or Organizations: No    Attends Tax Inspector Meetings: Not on file    Marital Status: Widowed  Intimate Partner Violence: Not on file     Constitutional: Denies fever, malaise, fatigue, headache or abrupt weight changes.  Respiratory: Denies difficulty breathing, shortness of breath, cough or sputum production.   Cardiovascular: Denies chest pain, chest tightness, palpitations or swelling in the hands or feet.  Gastrointestinal: Patient reports right flank pain.  Denies abdominal pain, bloating, constipation, diarrhea or blood in the stool.  GU: Patient reports urinary urgency.  Denies frequency, pain with urination, burning sensation, blood in urine, odor or discharge. Musculoskeletal: Denies decrease in range of motion, difficulty with gait, muscle pain or joint pain and swelling.  Skin: Denies redness, rashes, lesions or ulcercations.  Neurological: Denies dizziness, difficulty with memory, difficulty with speech or problems with balance and coordination.   No other specific complaints in a complete review of systems (except as listed in HPI above).  Objective:   Physical Exam   BP 128/84 (BP Location: Left Arm, Patient Position: Sitting, Cuff Size: Large)   Ht 5' 4 (1.626 m)   Wt 243 lb 12.8 oz (110.6 kg)   BMI 41.85 kg/m     Wt Readings from Last 3 Encounters:  11/03/23 238 lb 6 oz (108.1 kg)  04/30/23 234 lb 9.6 oz (106.4 kg)  03/26/23 231 lb (104.8 kg)    General: Appears her stated age, obese in NAD. Skin: Warm, dry and intact. No rashes  noted. Cardiovascular: Normal rate and rhythm. S1,S2 noted.  No murmur, rubs or gallops noted.  Pulmonary/Chest: Normal effort and positive vesicular breath sounds. No respiratory distress. No wheezes, rales or ronchi noted.  Abdomen: No CVA tenderness noted. Musculoskeletal: Normal flexion, extension, rotation and lateral bending of the spine but pain with lateral bending to the right and rotation to the right.  No bony tenderness noted over the lumbar spine.  No  difficulty with gait.  Neurological: Alert and oriented. Coordination normal.     BMET    Component Value Date/Time   NA 141 11/03/2023 0852   NA 141 04/13/2014 0654   K 4.2 11/03/2023 0852   K 3.5 04/13/2014 0654   CL 104 11/03/2023 0852   CL 106 04/13/2014 0654   CO2 28 11/03/2023 0852   CO2 29 04/13/2014 0654   GLUCOSE 90 11/03/2023 0852   GLUCOSE 91 04/13/2014 0654   BUN 14 11/03/2023 0852   BUN 12 04/13/2014 0654   CREATININE 0.74 11/03/2023 0852   CALCIUM  9.4 11/03/2023 0852   CALCIUM  8.7 04/13/2014 0654   GFRNONAA 72 08/29/2020 0930   GFRAA 84 08/29/2020 0930    Lipid Panel  Component Value Date/Time   CHOL 136 11/03/2023 0852   CHOL 178 09/16/2012 0743   TRIG 61 11/03/2023 0852   TRIG 79 09/16/2012 0743   HDL 51 11/03/2023 0852   HDL 41 09/16/2012 0743   CHOLHDL 2.7 11/03/2023 0852   VLDL 17 09/09/2017 0739   VLDL 16 09/16/2012 0743   LDLCALC 71 11/03/2023 0852   LDLCALC 121 (H) 09/16/2012 0743    CBC    Component Value Date/Time   WBC 7.1 11/03/2023 0852   RBC 4.84 11/03/2023 0852   HGB 12.3 11/03/2023 0852   HGB 12.8 05/09/2011 1506   HCT 40.8 11/03/2023 0852   HCT 40.2 05/09/2011 1506   PLT 323 11/03/2023 0852   PLT 289 05/09/2011 1506   MCV 84.3 11/03/2023 0852   MCV 80 05/09/2011 1506   MCH 25.4 (L) 11/03/2023 0852   MCHC 30.1 (L) 11/03/2023 0852   RDW 14.0 11/03/2023 0852   RDW 13.7 05/09/2011 1506   LYMPHSABS 2,703 06/15/2019 0840   LYMPHSABS 2.3 05/09/2011 1506   MONOABS 1.0 (H) 09/09/2017 0739   MONOABS 0.6 05/09/2011 1506   EOSABS 50 06/15/2019 0840   EOSABS 0.2 05/09/2011 1506   BASOSABS 50 06/15/2019 0840   BASOSABS 0.0 05/09/2011 1506    Hgb A1C Lab Results  Component Value Date   HGBA1C 6.0 (H) 11/03/2023           Assessment & Plan:   Assessment and Plan    Right flank pain, possible nephrolithiasis Intermittent right flank pain. Urinalysis shows moderate hematuria suggests possible nephrolithiasis.  Differential includes muscular pain. Pain mild, no severe symptoms, CT scan not immediately necessary. - Prescribed Flomax  0.4 mg tablet daily for 14 days. - Prescribed methocarbamol  500 mg every 8 hours as needed, advised bedtime use if working-sedation caution given. - Sent urine culture; will prescribe antibiotics if positive. - Advised to strain urine for stone detection. - Instructed to update by Friday on symptoms. - Discussed potential CT scan if symptoms worsen or persist.      RTC in 2 months for your annual exam Angeline Laura, NP

## 2024-03-09 LAB — URINE CULTURE
MICRO NUMBER:: 17332795
SPECIMEN QUALITY:: ADEQUATE

## 2024-03-10 ENCOUNTER — Ambulatory Visit: Payer: Self-pay | Admitting: Internal Medicine

## 2024-04-19 ENCOUNTER — Other Ambulatory Visit: Payer: Self-pay | Admitting: Internal Medicine

## 2024-04-19 DIAGNOSIS — E7800 Pure hypercholesterolemia, unspecified: Secondary | ICD-10-CM

## 2024-04-20 ENCOUNTER — Encounter: Payer: Self-pay | Admitting: Pharmacist

## 2024-04-20 ENCOUNTER — Other Ambulatory Visit: Payer: Self-pay

## 2024-04-20 MED ORDER — AMLODIPINE BESYLATE 10 MG PO TABS
10.0000 mg | ORAL_TABLET | Freq: Every day | ORAL | 0 refills | Status: AC
  Filled 2024-04-20 – 2024-04-26 (×2): qty 90, 90d supply, fill #0

## 2024-04-20 MED ORDER — ATORVASTATIN CALCIUM 20 MG PO TABS
20.0000 mg | ORAL_TABLET | Freq: Every day | ORAL | 1 refills | Status: AC
  Filled 2024-04-20 – 2024-04-26 (×2): qty 90, 90d supply, fill #0

## 2024-04-20 NOTE — Telephone Encounter (Signed)
 Requested by interface [email or web address]. Future visit 05/03/24.  Requested Prescriptions  Pending Prescriptions Disp Refills   amLODipine  (NORVASC ) 10 MG tablet 90 tablet 0    Sig: Take 1 tablet (10 mg total) by mouth daily.     Cardiovascular: Calcium  Channel Blockers 2 Passed - 04/20/2024 10:31 AM      Passed - Last BP in normal range    BP Readings from Last 1 Encounters:  03/08/24 128/84         Passed - Last Heart Rate in normal range    Pulse Readings from Last 1 Encounters:  11/03/23 61         Passed - Valid encounter within last 6 months    Recent Outpatient Visits           1 month ago Right flank pain   [place] [place], Angeline ORN, NP   5 months ago Pure hypercholesterolemia   [place] [facility] [place], [place], NP               atorvastatin  (LIPITOR) 20 MG tablet 90 tablet 1    Sig: Take 1 tablet (20 mg total) by mouth daily.     Cardiovascular:  Antilipid - Statins Failed - 04/20/2024 10:31 AM      Failed - Lipid Panel in normal range within the last 12 months    Cholesterol  Date Value Ref Range Status  11/03/2023 136 <200 mg/dL Final  93/80/7985 821 0 - 200 mg/dL Final   Ldl Cholesterol, Calc  Date Value Ref Range Status  09/16/2012 121 (H) 0 - 100 mg/dL Final   LDL Cholesterol (Calc)  Date Value Ref Range Status  11/03/2023 71 mg/dL (calc) Final    Comment:    Reference range: <100 . Desirable range <100 mg/dL for primary prevention;   <70 mg/dL for patients with CHD or diabetic patients  with > or = 2 CHD risk factors. SABRA LDL-C is now calculated using the Martin-Hopkins  calculation, which is a validated novel method providing  better accuracy than the Friedewald equation in the  estimation of LDL-C.  Gladis APPLETHWAITE et al. SANDREA. 7986;689(80): 2061-2068  (http://education.[facility].com/faq/FAQ164)    HDL Cholesterol  Date Value Ref Range Status  09/16/2012 41 40 - 60 mg/dL Final   HDL  Date  Value Ref Range Status  11/03/2023 51 > OR = 50 mg/dL Final   Triglycerides  Date Value Ref Range Status  11/03/2023 61 <150 mg/dL Final  93/80/7985 79 0 - 200 mg/dL Final         Passed - Patient is not pregnant      Passed - Valid encounter within last 12 months    Recent Outpatient Visits           1 month ago Right flank pain   [facility] [place], Angeline ORN, NP   5 months ago Pure hypercholesterolemia   [place] [facility] [place], Angeline ORN, [place]

## 2024-04-26 ENCOUNTER — Other Ambulatory Visit: Payer: Self-pay

## 2024-05-03 ENCOUNTER — Encounter: Admitting: Internal Medicine

## 2024-06-24 ENCOUNTER — Encounter: Admitting: Internal Medicine
